# Patient Record
Sex: Female | Born: 1990 | Race: Black or African American | Hispanic: No | Marital: Single | Smoking: Never smoker
Health system: Southern US, Community
[De-identification: ages and names within clinical notes are randomized; demographics above are authoritative.]

## PROBLEM LIST (undated history)

## (undated) ENCOUNTER — Inpatient Hospital Stay (HOSPITAL_COMMUNITY): Payer: Self-pay

## (undated) ENCOUNTER — Inpatient Hospital Stay (HOSPITAL_COMMUNITY): Payer: Medicaid Other

## (undated) DIAGNOSIS — E282 Polycystic ovarian syndrome: Secondary | ICD-10-CM

## (undated) DIAGNOSIS — A609 Anogenital herpesviral infection, unspecified: Secondary | ICD-10-CM

## (undated) DIAGNOSIS — R87629 Unspecified abnormal cytological findings in specimens from vagina: Secondary | ICD-10-CM

## (undated) DIAGNOSIS — R87619 Unspecified abnormal cytological findings in specimens from cervix uteri: Secondary | ICD-10-CM

## (undated) DIAGNOSIS — D649 Anemia, unspecified: Secondary | ICD-10-CM

## (undated) DIAGNOSIS — N390 Urinary tract infection, site not specified: Secondary | ICD-10-CM

## (undated) DIAGNOSIS — N979 Female infertility, unspecified: Secondary | ICD-10-CM

## (undated) DIAGNOSIS — N83209 Unspecified ovarian cyst, unspecified side: Secondary | ICD-10-CM

## (undated) DIAGNOSIS — IMO0002 Reserved for concepts with insufficient information to code with codable children: Secondary | ICD-10-CM

## (undated) DIAGNOSIS — J40 Bronchitis, not specified as acute or chronic: Secondary | ICD-10-CM

## (undated) DIAGNOSIS — O24419 Gestational diabetes mellitus in pregnancy, unspecified control: Secondary | ICD-10-CM

## (undated) DIAGNOSIS — G971 Other reaction to spinal and lumbar puncture: Secondary | ICD-10-CM

## (undated) HISTORY — DX: Unspecified abnormal cytological findings in specimens from vagina: R87.629

## (undated) HISTORY — DX: Bronchitis, not specified as acute or chronic: J40

## (undated) HISTORY — PX: WISDOM TOOTH EXTRACTION: SHX21

## (undated) HISTORY — PX: CHOLECYSTECTOMY: SHX55

## (undated) HISTORY — PX: CERVICAL CERCLAGE: SHX1329

---
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
## 2011-07-20 ENCOUNTER — Encounter: Payer: Self-pay | Admitting: Advanced Practice Midwife

## 2011-08-11 ENCOUNTER — Encounter: Payer: Self-pay | Admitting: Obstetrics and Gynecology

## 2012-03-22 ENCOUNTER — Inpatient Hospital Stay (HOSPITAL_COMMUNITY): Payer: Self-pay

## 2012-03-22 ENCOUNTER — Encounter (HOSPITAL_COMMUNITY): Payer: Self-pay

## 2012-03-22 ENCOUNTER — Inpatient Hospital Stay (HOSPITAL_COMMUNITY)
Admission: AD | Admit: 2012-03-22 | Discharge: 2012-03-22 | Disposition: A | Discharge status: Home / Self Care | Payer: Self-pay | Source: Ambulatory Visit | Attending: Family Medicine | Admitting: Family Medicine

## 2012-03-22 DIAGNOSIS — R1032 Left lower quadrant pain: Secondary | ICD-10-CM | POA: Insufficient documentation

## 2012-03-22 DIAGNOSIS — O209 Hemorrhage in early pregnancy, unspecified: Secondary | ICD-10-CM | POA: Insufficient documentation

## 2012-03-22 HISTORY — DX: Unspecified abnormal cytological findings in specimens from cervix uteri: R87.619

## 2012-03-22 HISTORY — DX: Urinary tract infection, site not specified: N39.0

## 2012-03-22 HISTORY — DX: Anemia, unspecified: D64.9

## 2012-03-22 HISTORY — DX: Reserved for concepts with insufficient information to code with codable children: IMO0002

## 2012-03-22 LAB — CBC
Hemoglobin: 11.1 g/dL — ABNORMAL LOW (ref 12.0–15.0)
MCV: 83 fL (ref 78.0–100.0)
Platelets: 291 10*3/uL (ref 150–400)
RDW: 15.1 % (ref 11.5–15.5)
WBC: 6.4 10*3/uL (ref 4.0–10.5)

## 2012-03-22 LAB — HCG, QUANTITATIVE, PREGNANCY: hCG, Beta Chain, Quant, S: 4985 m[IU]/mL — ABNORMAL HIGH (ref <5)

## 2012-03-22 LAB — POCT PREGNANCY, URINE: Preg Test, Ur: POSITIVE — AB

## 2012-03-22 NOTE — MAU Provider Note (Signed)
  History     CSN: [id number]  Arrival date and time: 03/22/12 1016   None     Chief Complaint  Patient presents with  . Abdominal Pain   HPI  Carla Henderson is a 21 y.o. G2P0 who presents today with LLQ pain. The pain started about a week ago, and she found out she was pregnant  A couple of days ago. She denies any vagina bleeding. She was taking ibuprofen and that helps the pain, but it returns when the medicine wears off.   Past Medical History  Diagnosis Date  . UTI (lower urinary tract infection)   . Abnormal Pap smear   . Anemia     Past Surgical History  Procedure Date  . Wisdom tooth extraction     History reviewed. No pertinent family history.  History  Substance Use Topics  . Smoking status: Never Smoker   . Smokeless tobacco: Not on file  . Alcohol Use: Yes     Comment: occassional     Allergies: Allergies not on file  No prescriptions prior to admission    ROS Physical Exam   Last menstrual period 02/05/2012.  Physical Exam  MAU Course  Procedures  Results for orders placed during the hospital encounter of 03/22/12 (from the past 24 hour(s))  CBC     Status: Abnormal   Collection Time   03/22/12 10:45 AM      Component Value Range   WBC 6.4  4.0 - 10.5 K/uL   RBC 4.05  3.87 - 5.11 MIL/uL   Hemoglobin 11.1 (*) 12.0 - 15.0 g/dL   HCT 33.6 (*) 36.0 - 46.0 %   MCV 83.0  78.0 - 100.0 fL   MCH 27.4  26.0 - 34.0 pg   MCHC 33.0  30.0 - 36.0 g/dL   RDW 15.1  11.5 - 15.5 %   Platelets 291  150 - 400 K/uL  HCG, QUANTITATIVE, PREGNANCY     Status: Abnormal   Collection Time   03/22/12 10:45 AM      Component Value Range   hCG, Beta Chain, Quant, S 4985 (*) <5 mIU/mL  POCT PREGNANCY, URINE     Status: Abnormal   Collection Time   03/22/12 10:46 AM      Component Value Range   Preg Test, Ur POSITIVE (*) NEGATIVE   Ultrasound shows in IUP with about a 1 week lag from LMP date. Recommend FU BHCG in 48 hours and repeat ultrasound in 2 weeks.     Assessment and Plan  1st trimester bleeding.  Repeat ultrasound in 2 weeks.  Mathis Bud 03/22/2012, 11:36 AM

## 2012-03-22 NOTE — MAU Note (Signed)
Pain started a wk ago. "pretty constant when sleeping, comes and goes when awake"left lower quadrant. Had preg confirmation at [facility].

## 2012-03-22 NOTE — MAU Provider Note (Signed)
Chart reviewed and agree with management and plan.

## 2012-03-22 NOTE — MAU Note (Signed)
Patient is in with c/o llq pain for a week. She states [facility] and was told to come to MAU if pain persists. She denies vaginal bleeding or discharge. Her lmp 02/05/12.

## 2012-04-05 ENCOUNTER — Ambulatory Visit (HOSPITAL_COMMUNITY)
Admission: RE | Admit: 2012-04-05 | Discharge: 2012-04-05 | Disposition: A | Discharge status: Home / Self Care | Payer: Medicaid Other | Source: Ambulatory Visit | Attending: Advanced Practice Midwife | Admitting: Advanced Practice Midwife

## 2012-04-05 ENCOUNTER — Encounter (HOSPITAL_COMMUNITY): Payer: Self-pay | Admitting: *Deleted

## 2012-04-05 ENCOUNTER — Inpatient Hospital Stay (HOSPITAL_COMMUNITY)
Admission: AD | Admit: 2012-04-05 | Discharge: 2012-04-05 | Disposition: A | Discharge status: Home / Self Care | Payer: Self-pay | Source: Ambulatory Visit | Attending: Obstetrics and Gynecology | Admitting: Obstetrics and Gynecology

## 2012-04-05 DIAGNOSIS — R109 Unspecified abdominal pain: Secondary | ICD-10-CM

## 2012-04-05 DIAGNOSIS — O99891 Other specified diseases and conditions complicating pregnancy: Secondary | ICD-10-CM | POA: Insufficient documentation

## 2012-04-05 DIAGNOSIS — O26899 Other specified pregnancy related conditions, unspecified trimester: Secondary | ICD-10-CM

## 2012-04-05 DIAGNOSIS — Z3689 Encounter for other specified antenatal screening: Secondary | ICD-10-CM | POA: Insufficient documentation

## 2012-04-05 DIAGNOSIS — O209 Hemorrhage in early pregnancy, unspecified: Secondary | ICD-10-CM

## 2012-04-05 DIAGNOSIS — R1032 Left lower quadrant pain: Secondary | ICD-10-CM | POA: Insufficient documentation

## 2012-04-05 NOTE — MAU Note (Signed)
Rollover from [place]

## 2012-04-05 NOTE — MAU Provider Note (Signed)
  History     CSN: [id number]  Arrival date and time: 04/05/12 1003   None     Chief Complaint  Patient presents with  . Follow-up   HPI Carla Henderson is 21 y.o. G1P0 60w4dweeks presenting for follow up ultrasound.  She was initially seen 2 weeks ago with a week history of LLQ pain in early pregnancy.  Denied bleeding.   LMP 10/14.  BHCG 4988.  Blood type A+.   Continues with cramping  Denies bleeding    Past Medical History  Diagnosis Date  . UTI (lower urinary tract infection)   . Abnormal Pap smear   . Anemia     Past Surgical History  Procedure Date  . Wisdom tooth extraction     No family history on file.  History  Substance Use Topics  . Smoking status: Never Smoker   . Smokeless tobacco: Not on file  . Alcohol Use: Yes     Comment: occassional     Allergies:  Allergies  Allergen Reactions  . Vicodin (Hydrocodone-Acetaminophen) Nausea And Vomiting    Prescriptions prior to admission  Medication Sig Dispense Refill  . ibuprofen (ADVIL,MOTRIN) 200 MG tablet Take 200 mg by mouth every 6 (six) hours as needed. For pain.      .Marland KitchenPhenazopyridine HCl (AZO TABS PO) Take 4-5 tablets by mouth daily.        Review of Systems  Constitutional: Negative.   Gastrointestinal: Positive for abdominal pain (cramping).  Genitourinary:       Neg for vaginal bleeding   Physical Exam   Last menstrual period 02/05/2012.  Physical Exam  Constitutional: She is oriented to person, place, and time. She appears well-developed and well-nourished. No distress.  Respiratory: Effort normal.  Genitourinary:       Not indicated  Neurological: She is alert and oriented to person, place, and time.  Psychiatric: She has a normal mood and affect. Her behavior is normal.       MAU Course  Procedures  MDM Reviewed ultrasound with patient.  Assessment and Plan  A:  Intrauterine pregnancy at 777w3destation     Cramping     Viable pregnancy by Ultrasound  P:  Instructed  patient to call [facility] to begin prenatal care      Tylenol prn for cramping  KEY,EVE M 04/05/2012, 10:08 AM

## 2012-04-09 NOTE — MAU Provider Note (Signed)
Attestation of Attending Supervision of Advanced Practitioner (CNM/NP): Evaluation and management procedures were performed by the Advanced Practitioner under my supervision and collaboration.  I have reviewed the Advanced Practitioner's note and chart, and I agree with the management and plan.  Cassidey Barrales 04/09/2012 6:10 PM

## 2012-05-15 ENCOUNTER — Encounter: Payer: Self-pay | Admitting: Advanced Practice Midwife

## 2013-01-20 ENCOUNTER — Inpatient Hospital Stay (HOSPITAL_COMMUNITY)
Admission: AD | Admit: 2013-01-20 | Discharge: 2013-01-20 | Disposition: A | Discharge status: Home / Self Care | Payer: Medicaid Other | Source: Ambulatory Visit | Attending: Obstetrics & Gynecology | Admitting: Obstetrics & Gynecology

## 2013-01-20 ENCOUNTER — Encounter (HOSPITAL_COMMUNITY): Payer: Self-pay

## 2013-01-20 DIAGNOSIS — N926 Irregular menstruation, unspecified: Secondary | ICD-10-CM | POA: Insufficient documentation

## 2013-01-20 DIAGNOSIS — N949 Unspecified condition associated with female genital organs and menstrual cycle: Secondary | ICD-10-CM | POA: Insufficient documentation

## 2013-01-20 DIAGNOSIS — N938 Other specified abnormal uterine and vaginal bleeding: Secondary | ICD-10-CM | POA: Insufficient documentation

## 2013-01-20 LAB — CBC
HCT: 31.3 % — ABNORMAL LOW (ref 36.0–46.0)
Hemoglobin: 10.2 g/dL — ABNORMAL LOW (ref 12.0–15.0)
MCH: 25.8 pg — ABNORMAL LOW (ref 26.0–34.0)
MCHC: 32.6 g/dL (ref 30.0–36.0)
MCV: 79 fL (ref 78.0–100.0)
RDW: 14.7 % (ref 11.5–15.5)

## 2013-01-20 LAB — URINALYSIS, ROUTINE W REFLEX MICROSCOPIC
Bilirubin Urine: NEGATIVE
Leukocytes, UA: NEGATIVE
Urobilinogen, UA: 0.2 mg/dL (ref 0.0–1.0)

## 2013-01-20 LAB — POCT PREGNANCY, URINE: Preg Test, Ur: NEGATIVE

## 2013-01-20 LAB — WET PREP, GENITAL: WBC, Wet Prep HPF POC: NONE SEEN

## 2013-01-20 LAB — URINE MICROSCOPIC-ADD ON

## 2013-01-20 MED ORDER — FERROUS SULFATE 325 (65 FE) MG PO TABS: 325.0000 mg | ORAL_TABLET | Freq: Every day | ORAL | Status: DC

## 2013-01-20 NOTE — MAU Note (Signed)
Pt has journal of her periods - have been getting progressively longer & heavier, is now having 2nd period in September.

## 2013-01-20 NOTE — MAU Provider Note (Signed)
History     CSN: [id number]  Arrival date and time: 01/20/13 1509   First Provider Initiated Contact with Patient 01/20/13 [phone]      Chief Complaint  Patient presents with  . Vaginal Bleeding   HPI [name] is a 22 y.o. G1P0010 who presents to MAU today with complaint of vaginal bleeding. The patient had SAB in January and states a history of irregular, heavy prolonged periods since then. The has also had associated cramping rated at 5/10 today. She denies use of pain medication, vaginal discharge, UTI symptoms, dizziness, fever, N/V/D or constipation or feeling lightheaded. The patient states that she is currently sexually active without protection and desires pregnancy. She does feel weak and tired. LMP was 12/30/12 - 01/07/13 and patient started bleeding again yesterday. Patient has a journal of her periods since June and they have become progressively more prolonged although seem fairly regular aside from this new episode of bleeding today.   OB History   Grav Para Term Preterm Abortions TAB SAB Ect Mult Living   1               Past Medical History  Diagnosis Date  . UTI (lower urinary tract infection)   . Abnormal Pap smear   . Anemia     Past Surgical History  Procedure Laterality Date  . Wisdom tooth extraction      Family History  Problem Relation Age of Onset  . Other Neg Hx     History  Substance Use Topics  . Smoking status: Never Smoker   . Smokeless tobacco: Not on file  . Alcohol Use: Yes     Comment: occassional     Allergies:  Allergies  Allergen Reactions  . Vicodin [Hydrocodone-Acetaminophen] Nausea And Vomiting    No prescriptions prior to admission    Review of Systems  Constitutional: Positive for malaise/fatigue. Negative for fever.  Gastrointestinal: Positive for abdominal pain. Negative for nausea, vomiting, diarrhea and constipation.  Genitourinary: Negative for dysuria, urgency and frequency.       Neg - vaginal  discharge + vaginal bleeding  Neurological: Positive for weakness. Negative for dizziness and loss of consciousness.   Physical Exam   Blood pressure 123/70, pulse 90, temperature 98.4 F (36.9 C), temperature source Oral, resp. rate 18, height 5' 7"  (1.702 m), weight 209 lb 12.8 oz (95.165 kg), last menstrual period 12/31/2011, SpO2 99.00%, unknown if currently breastfeeding.  Physical Exam  Constitutional: She is oriented to person, place, and time. She appears well-developed and well-nourished. No distress.  HENT:  Head: Normocephalic.  Cardiovascular: Normal rate, regular rhythm and normal heart sounds.   Respiratory: Effort normal and breath sounds normal. No respiratory distress.  GI: Soft. Bowel sounds are normal. She exhibits no distension and no mass. There is tenderness (mild tenderness to palpation of the LLQ). There is no rebound and no guarding.  Genitourinary: Uterus is not enlarged (exam limited by body habitus) and not tender. Cervix exhibits no motion tenderness, no discharge and no friability. Right adnexum displays no mass and no tenderness. Left adnexum displays no mass and no tenderness. There is bleeding (moderate amount of blood in the vaginal vault) around the vagina. No vaginal discharge found.  Neurological: She is alert and oriented to person, place, and time.  Skin: Skin is warm and dry. No erythema.  Psychiatric: She has a normal mood and affect.   Results for orders placed during the hospital encounter of 01/20/13 (from the past 24  hour(s))  URINALYSIS, ROUTINE W REFLEX MICROSCOPIC     Status: Abnormal   Collection Time    01/20/13  4:20 PM      Result Value Range   Color, Urine YELLOW  YELLOW   APPearance CLEAR  CLEAR   Specific Gravity, Urine 1.025  1.005 - 1.030   pH 6.0  5.0 - 8.0   Glucose, UA NEGATIVE  NEGATIVE mg/dL   Hgb urine dipstick LARGE (*) NEGATIVE   Bilirubin Urine NEGATIVE  NEGATIVE   Ketones, ur NEGATIVE  NEGATIVE mg/dL   Protein, ur  NEGATIVE  NEGATIVE mg/dL   Urobilinogen, UA 0.2  0.0 - 1.0 mg/dL   Nitrite NEGATIVE  NEGATIVE   Leukocytes, UA NEGATIVE  NEGATIVE  WET PREP, GENITAL     Status: Abnormal   Collection Time    01/20/13  4:20 PM      Result Value Range   Yeast Wet Prep HPF POC NONE SEEN  NONE SEEN   Trich, Wet Prep NONE SEEN  NONE SEEN   Clue Cells Wet Prep HPF POC FEW (*) NONE SEEN   WBC, Wet Prep HPF POC NONE SEEN  NONE SEEN  URINE MICROSCOPIC-ADD ON     Status: Abnormal   Collection Time    01/20/13  4:20 PM      Result Value Range   Squamous Epithelial / LPF RARE  RARE   WBC, UA 0-2  <3 WBC/hpf   RBC / HPF 21-50  <3 RBC/hpf   Bacteria, UA FEW (*) RARE   Urine-Other MUCOUS PRESENT    POCT PREGNANCY, URINE     Status: None   Collection Time    01/20/13  4:28 PM      Result Value Range   Preg Test, Ur NEGATIVE  NEGATIVE  CBC     Status: Abnormal   Collection Time    01/20/13  4:46 PM      Result Value Range   WBC 6.3  4.0 - 10.5 K/uL   RBC 3.96  3.87 - 5.11 MIL/uL   Hemoglobin 10.2 (*) 12.0 - 15.0 g/dL   HCT 31.3 (*) 36.0 - 46.0 %   MCV 79.0  78.0 - 100.0 fL   MCH 25.8 (*) 26.0 - 34.0 pg   MCHC 32.6  30.0 - 36.0 g/dL   RDW 14.7  11.5 - 15.5 %   Platelets 308  150 - 400 K/uL    MAU Course  Procedures None  MDM UPT UA, Wet prep, GC/Chlamydia, CBC today Patient is hemodynamically stable Discussed OCPs as an option for short-term regulation of cycles. Patient will consider prior to [facility] clinic appointment Assessment and Plan  A: Irregular menses  P: Discharge home Rx for Ferrous Sulfate sent to patient's pharmacy Patient referred to [facility] clinic for follow-up and further management Patient may return to MAU as needed or if her condition were to change or worsen  Farris Has, PA-C  01/20/2013, 5:31 PM

## 2013-01-20 NOTE — MAU Provider Note (Signed)
Attestation of Attending Supervision of Advanced Practitioner (CNM/NP): Evaluation and management procedures were performed by the Advanced Practitioner under my supervision and collaboration.  I have reviewed the Advanced Practitioner's note and chart, and I agree with the management and plan.  HARRAWAY-SMITH, Jaculin Rasmus 6:44 PM

## 2013-01-20 NOTE — MAU Note (Signed)
Patient states she had a SAB in January. Has been having irregular periods since that time being heavier and longer. Last period 9-8/16 and started again yesterday. Heavy bleeding with cramping.

## 2013-01-21 LAB — GC/CHLAMYDIA PROBE AMP: GC Probe RNA: NEGATIVE

## 2013-02-17 ENCOUNTER — Encounter: Payer: Medicaid Other | Admitting: Obstetrics & Gynecology

## 2013-08-05 ENCOUNTER — Inpatient Hospital Stay (HOSPITAL_COMMUNITY)
Admission: AD | Admit: 2013-08-05 | Discharge: 2013-08-05 | Disposition: A | Discharge status: Home / Self Care | Payer: Medicaid Other | Source: Ambulatory Visit | Attending: Obstetrics & Gynecology | Admitting: Obstetrics & Gynecology

## 2013-08-05 ENCOUNTER — Encounter (HOSPITAL_COMMUNITY): Payer: Self-pay | Admitting: *Deleted

## 2013-08-05 DIAGNOSIS — N926 Irregular menstruation, unspecified: Secondary | ICD-10-CM | POA: Insufficient documentation

## 2013-08-05 DIAGNOSIS — N925 Other specified irregular menstruation: Secondary | ICD-10-CM | POA: Insufficient documentation

## 2013-08-05 DIAGNOSIS — N949 Unspecified condition associated with female genital organs and menstrual cycle: Secondary | ICD-10-CM | POA: Insufficient documentation

## 2013-08-05 DIAGNOSIS — A599 Trichomoniasis, unspecified: Secondary | ICD-10-CM

## 2013-08-05 DIAGNOSIS — D649 Anemia, unspecified: Secondary | ICD-10-CM | POA: Insufficient documentation

## 2013-08-05 DIAGNOSIS — N938 Other specified abnormal uterine and vaginal bleeding: Secondary | ICD-10-CM | POA: Insufficient documentation

## 2013-08-05 DIAGNOSIS — A5901 Trichomonal vulvovaginitis: Secondary | ICD-10-CM | POA: Insufficient documentation

## 2013-08-05 LAB — CBC
HCT: 34.2 % — ABNORMAL LOW (ref 36.0–46.0)
HEMOGLOBIN: 11.1 g/dL — AB (ref 12.0–15.0)
MCH: 27.3 pg (ref 26.0–34.0)
MCHC: 32.5 g/dL (ref 30.0–36.0)
MCV: 84.2 fL (ref 78.0–100.0)
PLATELETS: 264 10*3/uL (ref 150–400)
RBC: 4.06 MIL/uL (ref 3.87–5.11)
RDW: 15.8 % — ABNORMAL HIGH (ref 11.5–15.5)
WBC: 6.4 10*3/uL (ref 4.0–10.5)

## 2013-08-05 LAB — WET PREP, GENITAL: YEAST WET PREP: NONE SEEN

## 2013-08-05 LAB — POCT PREGNANCY, URINE: PREG TEST UR: NEGATIVE

## 2013-08-05 MED ORDER — METRONIDAZOLE 500 MG PO TABS
2000.0000 mg | ORAL_TABLET | Freq: Once | ORAL | Status: AC
  Administered 2013-08-05: 2000 mg via ORAL
  Filled 2013-08-05: qty 4

## 2013-08-05 NOTE — MAU Note (Signed)
Patient states she has had a history of irregular periods. Was seen in September, but on BCP's for a while then stopped. Periods are irregular again. States she is trying to get pregnant. Thinks she might have PCOS. Denies pain. Is currently on period for 9 days. States bleeding is heavy and changed pads x 3 today.

## 2013-08-05 NOTE — MAU Provider Note (Signed)
History     CSN: [id number]  Arrival date and time: 08/05/13 1800   First Provider Initiated Contact with Patient 08/05/13 2056      No chief complaint on file.  HPI [name] is a 23 y.o. G2P0010 who presents to MAU today with complaint of vaginal bleeding x 8 days. The patient states that she has a history of irregular periods for which she was started on OCPs. She states that she recently stopped her OCPs earlier this year because she desires pregnancy. She states that she has had irregular periods again since stopping OCPs. The current episode started on 07/27/13 and continues today. She states that the bleeding is heavy, but only requiring her to change her pad 3x today. She denies abdominal pain, fever, dizziness or N/V/D or constipation.   OB History   Grav Para Term Preterm Abortions TAB SAB Ect Mult Living   2    1  1          Past Medical History  Diagnosis Date  . UTI (lower urinary tract infection)   . Abnormal Pap smear   . Anemia     Past Surgical History  Procedure Laterality Date  . Wisdom tooth extraction      Family History  Problem Relation Age of Onset  . Other Neg Hx     History  Substance Use Topics  . Smoking status: Never Smoker   . Smokeless tobacco: Not on file  . Alcohol Use: No     Comment: occassional     Allergies:  Allergies  Allergen Reactions  . Vicodin [Hydrocodone-Acetaminophen] Nausea And Vomiting    Prescriptions prior to admission  Medication Sig Dispense Refill  . Prenatal Vit-Fe Fumarate-FA (PRENATAL MULTIVITAMIN) TABS tablet Take 1 tablet by mouth daily at 12 noon.        Review of Systems  Constitutional: Negative for fever and malaise/fatigue.  Gastrointestinal: Negative for nausea, vomiting, abdominal pain, diarrhea and constipation.  Genitourinary: Negative for dysuria, urgency and frequency.       + vaginal bleeding  Neurological: Negative for dizziness.   Physical Exam   Blood pressure 124/83, pulse  101, temperature 98.6 F (37 C), temperature source Oral, resp. rate 18, height 5' 6"  (1.676 m), weight 94.983 kg (209 lb 6.4 oz), last menstrual period 07/27/2013, SpO2 99.00%, unknown if currently breastfeeding.  Physical Exam  Constitutional: She is oriented to person, place, and time. She appears well-developed and well-nourished.  HENT:  Head: Normocephalic and atraumatic.  Cardiovascular: Normal rate.   Respiratory: Effort normal.  GI: Soft. She exhibits no distension and no mass. There is no tenderness. There is no rebound and no guarding.  Genitourinary: Uterus is not enlarged and not tender. Cervix exhibits no motion tenderness, no discharge and no friability. Right adnexum displays no mass and no tenderness. Left adnexum displays no mass and no tenderness. There is bleeding (small amount of blood in the vaginal vault) around the vagina. No vaginal discharge found.  Neurological: She is alert and oriented to person, place, and time.  Skin: Skin is warm and dry. No erythema.  Psychiatric: She has a normal mood and affect.   Results for orders placed during the hospital encounter of 08/05/13 (from the past 24 hour(s))  POCT PREGNANCY, URINE     Status: None   Collection Time    08/05/13  6:31 PM      Result Value Ref Range   Preg Test, Ur NEGATIVE  NEGATIVE  CBC  Status: Abnormal   Collection Time    08/05/13  8:10 PM      Result Value Ref Range   WBC 6.4  4.0 - 10.5 K/uL   RBC 4.06  3.87 - 5.11 MIL/uL   Hemoglobin 11.1 (*) 12.0 - 15.0 g/dL   HCT 34.2 (*) 36.0 - 46.0 %   MCV 84.2  78.0 - 100.0 fL   MCH 27.3  26.0 - 34.0 pg   MCHC 32.5  30.0 - 36.0 g/dL   RDW 15.8 (*) 11.5 - 15.5 %   Platelets 264  150 - 400 K/uL  WET PREP, GENITAL     Status: Abnormal   Collection Time    08/05/13  9:08 PM      Result Value Ref Range   Yeast Wet Prep HPF POC NONE SEEN  NONE SEEN   Trich, Wet Prep FEW (*) NONE SEEN   Clue Cells Wet Prep HPF POC FEW (*) NONE SEEN   WBC, Wet Prep HPF  POC FEW (*) NONE SEEN    MAU Course  Procedures None  MDM UPT - negative CBC, wet prep and GC/Chlamydia today 2G Flagyl given in MAU Assessment and Plan  A: Irregular periods possibly secondary to PCOS Trichomonas  P: Discharge home Patient treated with Flagyl in MAU today Partner treatment advised for trichomonas Patient given follow-up appointment in [place] on Friday, April 17th at 8:00 am Patient may return to MAU as needed or if her condition were to change or worsen  Farris Has, PA-C  08/05/2013, 8:56 PM

## 2013-08-05 NOTE — Discharge Instructions (Signed)
Trichomoniasis Trichomoniasis is an infection, caused by the Trichomonas organism, that affects both women and men. In women, the outer female genitalia and the vagina are affected. In men, the penis is mainly affected, but the prostate and other reproductive organs can also be involved. Trichomoniasis is a sexually transmitted disease (STD) and is most often passed to another person through sexual contact. The majority of people who get trichomoniasis do so from a sexual encounter and are also at risk for other STDs. CAUSES   Sexual intercourse with an infected partner.  It can be present in swimming pools or hot tubs. SYMPTOMS   Abnormal gray-green frothy vaginal discharge in women.  Vaginal itching and irritation in women.  Itching and irritation of the area outside the vagina in women.  Penile discharge with or without pain in males.  Inflammation of the urethra (urethritis), causing painful urination.  Bleeding after sexual intercourse. RELATED COMPLICATIONS  Pelvic inflammatory disease.  Infection of the uterus (endometritis).  Infertility.  Tubal (ectopic) pregnancy.  It can be associated with other STDs, including gonorrhea and chlamydia, hepatitis B, and HIV. COMPLICATIONS DURING PREGNANCY  Early (premature) delivery.  Premature rupture of the membranes (PROM).  Low birth weight. DIAGNOSIS   Visualization of Trichomonas under the microscope from the vagina discharge.  Ph of the vagina greater than 4.5, tested with a test tape.  Trich Rapid Test.  Culture of the organism, but this is not usually needed.  It may be found on a Pap test.  Having a "strawberry cervix,"which means the cervix looks very red like a strawberry. TREATMENT   You may be given medication to fight the infection. Inform your caregiver if you could be or are pregnant. Some medications used to treat the infection should not be taken during pregnancy.  Over-the-counter medications or  creams to decrease itching or irritation may be recommended.  Your sexual partner will need to be treated if infected. HOME CARE INSTRUCTIONS   Take all medication prescribed by your caregiver.  Take over-the-counter medication for itching or irritation as directed by your caregiver.  Do not have sexual intercourse while you have the infection.  Do not douche or wear tampons.  Discuss your infection with your partner, as your partner may have acquired the infection from you. Or, your partner may have been the person who transmitted the infection to you.  Have your sex partner examined and treated if necessary.  Practice safe, informed, and protected sex.  See your caregiver for other STD testing. SEEK MEDICAL CARE IF:   You still have symptoms after you finish the medication.  You have an oral temperature above 102 F (38.9 C).  You develop belly (abdominal) pain.  You have pain when you urinate.  You have bleeding after sexual intercourse.  You develop a rash.  The medication makes you sick or makes you throw up (vomit). Document Released: 10/04/2000 Document Revised: 07/03/2011 Document Reviewed: 10/30/2008 [facility] Patient Information 2014 [place], [place]. Polycystic Ovarian Syndrome Polycystic ovarian syndrome (PCOS) is a common hormonal disorder among women of reproductive age. Most women with PCOS grow many small cysts on their ovaries. PCOS can cause problems with your periods and make it difficult to get pregnant. It can also cause an increased risk of miscarriage with pregnancy. If left untreated, PCOS can lead to serious health problems, such as diabetes and heart disease. CAUSES The cause of PCOS is not fully understood, but genetics may be a factor. SIGNS AND SYMPTOMS   Infrequent or  no menstrual periods.   Inability to get pregnant (infertility) because of not ovulating.   Increased growth of hair on the face, chest, stomach, back, thumbs, thighs, or  toes.   Acne, oily skin, or dandruff.   Pelvic pain.   Weight gain or obesity, usually carrying extra weight around the waist.   Type 2 diabetes.   High cholesterol.   High blood pressure.   Female-pattern baldness or thinning hair.   Patches of thickened and dark brown or black skin on the neck, arms, breasts, or thighs.   Tiny excess flaps of skin (skin tags) in the armpits or neck area.   Excessive snoring and having breathing stop at times while asleep (sleep apnea).   Deepening of the voice.   Gestational diabetes when pregnant.  DIAGNOSIS  There is no single test to diagnose PCOS.   Your health care provider will:   Take a medical history.   Perform a pelvic exam.   Have ultrasonography done.   Check your female and female hormone levels.   Measure glucose or sugar levels in the blood.   Do other blood tests.   If you are producing too many female hormones, your health care provider will make sure it is from PCOS. At the physical exam, your health care provider will want to evaluate the areas of increased hair growth. Try to allow natural hair growth for a few days before the visit.   During a pelvic exam, the ovaries may be enlarged or swollen because of the increased number of small cysts. This can be seen more easily by using vaginal ultrasonography or screening to examine the ovaries and lining of the uterus (endometrium) for cysts. The uterine lining may become thicker if you have not been having a regular period.  TREATMENT  Because there is no cure for PCOS, it needs to be managed to prevent problems. Treatments are based on your symptoms. Treatment is also based on whether you want to have a baby or whether you need contraception.  Treatment may include:   Progesterone hormone to start a menstrual period.   Birth control pills to make you have regular menstrual periods.   Medicines to make you ovulate, if you want to get  pregnant.   Medicines to control your insulin.   Medicine to control your blood pressure.   Medicine and diet to control your high cholesterol and triglycerides in your blood.  Medicine to reduce excessive hair growth.  Surgery, making small holes in the ovary, to decrease the amount of female hormone production. This is done through a long, lighted tube (laparoscope) placed into the pelvis through a tiny incision in the lower abdomen.  HOME CARE INSTRUCTIONS  Only take over-the-counter or prescription medicine as directed by your health care provider.  Pay attention to the foods you eat and your activity levels. This can help reduce the effects of PCOS.  Keep your weight under control.  Eat foods that are low in carbohydrate and high in fiber.  Exercise regularly. SEEK MEDICAL CARE IF:  Your symptoms do not get better with medicine.  You have new symptoms. Document Released: 08/04/2004 Document Revised: 01/29/2013 Document Reviewed: 09/26/2012 [facility] Patient Information 2014 [place], [place].

## 2013-08-06 ENCOUNTER — Encounter (HOSPITAL_COMMUNITY): Payer: Self-pay | Admitting: Medical

## 2013-08-06 LAB — GC/CHLAMYDIA PROBE AMP
CT PROBE, AMP APTIMA: NEGATIVE
GC Probe RNA: NEGATIVE

## 2013-08-07 NOTE — MAU Provider Note (Signed)
Attestation of Attending Supervision of Advanced Practitioner (CNM/NP): Evaluation and management procedures were performed by the Advanced Practitioner under my supervision and collaboration. I have reviewed the Advanced Practitioner's note and chart, and I agree with the management and plan.  Fredderick Phenix Amen Dargis 6:08 AM

## 2013-08-08 ENCOUNTER — Encounter: Payer: Self-pay | Admitting: Obstetrics & Gynecology

## 2013-08-08 ENCOUNTER — Ambulatory Visit (INDEPENDENT_AMBULATORY_CARE_PROVIDER_SITE_OTHER): Payer: Medicaid Other | Admitting: Obstetrics & Gynecology

## 2013-08-08 VITALS — BP 119/82 | HR 78 | Ht 69.0 in | Wt 208.9 lb

## 2013-08-08 DIAGNOSIS — N926 Irregular menstruation, unspecified: Secondary | ICD-10-CM

## 2013-08-08 MED ORDER — METFORMIN HCL 500 MG PO TABS: 500.0000 mg | ORAL_TABLET | Freq: Two times a day (BID) | ORAL | Status: DC

## 2013-08-08 NOTE — Patient Instructions (Signed)
Polycystic Ovarian Syndrome Polycystic ovarian syndrome (PCOS) is a common hormonal disorder among women of reproductive age. Most women with PCOS grow many small cysts on their ovaries. PCOS can cause problems with your periods and make it difficult to get pregnant. It can also cause an increased risk of miscarriage with pregnancy. If left untreated, PCOS can lead to serious health problems, such as diabetes and heart disease. CAUSES The cause of PCOS is not fully understood, but genetics may be a factor. SIGNS AND SYMPTOMS   Infrequent or no menstrual periods.   Inability to get pregnant (infertility) because of not ovulating.   Increased growth of hair on the face, chest, stomach, back, thumbs, thighs, or toes.   Acne, oily skin, or dandruff.   Pelvic pain.   Weight gain or obesity, usually carrying extra weight around the waist.   Type 2 diabetes.   High cholesterol.   High blood pressure.   Female-pattern baldness or thinning hair.   Patches of thickened and dark brown or black skin on the neck, arms, breasts, or thighs.   Tiny excess flaps of skin (skin tags) in the armpits or neck area.   Excessive snoring and having breathing stop at times while asleep (sleep apnea).   Deepening of the voice.   Gestational diabetes when pregnant.  DIAGNOSIS  There is no single test to diagnose PCOS.   Your health care provider will:   Take a medical history.   Perform a pelvic exam.   Have ultrasonography done.   Check your female and female hormone levels.   Measure glucose or sugar levels in the blood.   Do other blood tests.   If you are producing too many female hormones, your health care provider will make sure it is from PCOS. At the physical exam, your health care provider will want to evaluate the areas of increased hair growth. Try to allow natural hair growth for a few days before the visit.   During a pelvic exam, the ovaries may be enlarged  or swollen because of the increased number of small cysts. This can be seen more easily by using vaginal ultrasonography or screening to examine the ovaries and lining of the uterus (endometrium) for cysts. The uterine lining may become thicker if you have not been having a regular period.  TREATMENT  Because there is no cure for PCOS, it needs to be managed to prevent problems. Treatments are based on your symptoms. Treatment is also based on whether you want to have a baby or whether you need contraception.  Treatment may include:   Progesterone hormone to start a menstrual period.   Birth control pills to make you have regular menstrual periods.   Medicines to make you ovulate, if you want to get pregnant.   Medicines to control your insulin.   Medicine to control your blood pressure.   Medicine and diet to control your high cholesterol and triglycerides in your blood.  Medicine to reduce excessive hair growth.  Surgery, making small holes in the ovary, to decrease the amount of female hormone production. This is done through a long, lighted tube (laparoscope) placed into the pelvis through a tiny incision in the lower abdomen.  HOME CARE INSTRUCTIONS  Only take over-the-counter or prescription medicine as directed by your health care provider.  Pay attention to the foods you eat and your activity levels. This can help reduce the effects of PCOS.  Keep your weight under control.  Eat foods that are  low in carbohydrate and high in fiber.  Exercise regularly. SEEK MEDICAL CARE IF:  Your symptoms do not get better with medicine.  You have new symptoms. Document Released: 08/04/2004 Document Revised: 01/29/2013 Document Reviewed: 09/26/2012 [facility] Patient Information 2014 [place], [place].

## 2013-08-08 NOTE — Progress Notes (Signed)
Patient ID: Carla Henderson, female   DOB: February 25, 1991, 23 y.o.   MRN: [id number]  Chief Complaint  Patient presents with  . Menorrhagia    HPI Carla Henderson is a 23 y.o. female.  [id number] Patient's last menstrual period was 07/27/2013. Suspects she has PCOS and wants to try metformin. Trying to conceive. Stopped OCP 2 mo ago and  Had heavy period recently, went to [facility] HPI  Past Medical History  Diagnosis Date  . UTI (lower urinary tract infection)   . Abnormal Pap smear   . Anemia     Past Surgical History  Procedure Laterality Date  . Wisdom tooth extraction      Family History  Problem Relation Age of Onset  . Other Neg Hx   . Hypertension Paternal Grandmother   . Diabetes Paternal Grandfather     Social History History  Substance Use Topics  . Smoking status: Never Smoker   . Smokeless tobacco: Never Used  . Alcohol Use: No     Comment: occassional     Allergies  Allergen Reactions  . Vicodin [Hydrocodone-Acetaminophen] Nausea And Vomiting    Current Outpatient Prescriptions  Medication Sig Dispense Refill  . metFORMIN (GLUCOPHAGE) 500 MG tablet Take 1 tablet (500 mg total) by mouth 2 (two) times daily with a meal.  60 tablet  6  . Prenatal Vit-Fe Fumarate-FA (PRENATAL MULTIVITAMIN) TABS tablet Take 1 tablet by mouth daily at 12 noon.       No current facility-administered medications for this visit.    Review of Systems Review of Systems  Constitutional: Negative.   Genitourinary: Positive for vaginal bleeding and menstrual problem. Negative for vaginal discharge and pelvic pain.    Blood pressure 119/82, pulse 78, height 5' 9"  (1.753 m), weight 208 lb 14.4 oz (94.756 kg), last menstrual period 07/27/2013, not currently breastfeeding.  Physical Exam Physical Exam  Constitutional: She is oriented to person, place, and time. She appears well-developed. No distress.  Neurological: She is alert and oriented to person, place, and time.  Skin: Skin is warm  and dry.  Psychiatric: She has a normal mood and affect. Her behavior is normal.    Data Reviewed [facility] note  Assessment    H/O irregular menses, decline OCP for cycle control     Plan    Metformin 500  Mg BID, RTC 4-6 mo       Woodroe Mode 08/08/2013, 8:49 AM

## 2013-08-08 NOTE — Progress Notes (Signed)
Pt seen in [facility] for prolonged periods last about 9 days. Not regular. Also told in [facility] that she may have PCOS.

## 2014-02-23 ENCOUNTER — Encounter: Payer: Self-pay | Admitting: Obstetrics & Gynecology

## 2014-03-23 ENCOUNTER — Encounter (HOSPITAL_COMMUNITY): Payer: Self-pay | Admitting: *Deleted

## 2014-03-23 ENCOUNTER — Emergency Department (HOSPITAL_COMMUNITY): Payer: Medicaid Other

## 2014-03-23 ENCOUNTER — Emergency Department (HOSPITAL_COMMUNITY)
Admission: EM | Admit: 2014-03-23 | Discharge: 2014-03-23 | Disposition: A | Discharge status: Home / Self Care | Payer: Medicaid Other | Attending: Emergency Medicine | Admitting: Emergency Medicine

## 2014-03-23 DIAGNOSIS — W109XXA Fall (on) (from) unspecified stairs and steps, initial encounter: Secondary | ICD-10-CM | POA: Insufficient documentation

## 2014-03-23 DIAGNOSIS — Z79899 Other long term (current) drug therapy: Secondary | ICD-10-CM | POA: Insufficient documentation

## 2014-03-23 DIAGNOSIS — Y9389 Activity, other specified: Secondary | ICD-10-CM | POA: Insufficient documentation

## 2014-03-23 DIAGNOSIS — W19XXXA Unspecified fall, initial encounter: Secondary | ICD-10-CM

## 2014-03-23 DIAGNOSIS — Y998 Other external cause status: Secondary | ICD-10-CM | POA: Insufficient documentation

## 2014-03-23 DIAGNOSIS — Z87440 Personal history of urinary (tract) infections: Secondary | ICD-10-CM | POA: Insufficient documentation

## 2014-03-23 DIAGNOSIS — S161XXA Strain of muscle, fascia and tendon at neck level, initial encounter: Secondary | ICD-10-CM | POA: Insufficient documentation

## 2014-03-23 DIAGNOSIS — Y9289 Other specified places as the place of occurrence of the external cause: Secondary | ICD-10-CM | POA: Insufficient documentation

## 2014-03-23 DIAGNOSIS — Z862 Personal history of diseases of the blood and blood-forming organs and certain disorders involving the immune mechanism: Secondary | ICD-10-CM | POA: Insufficient documentation

## 2014-03-23 MED ORDER — TRAMADOL HCL 50 MG PO TABS: 50.0000 mg | ORAL_TABLET | Freq: Four times a day (QID) | ORAL | Status: DC | PRN

## 2014-03-23 MED ORDER — CYCLOBENZAPRINE HCL 10 MG PO TABS: 10.0000 mg | ORAL_TABLET | Freq: Three times a day (TID) | ORAL | Status: DC | PRN

## 2014-03-23 NOTE — ED Provider Notes (Signed)
CSN: [id number]     Arrival date & time 03/23/14  1601 History  This chart was scribed for Kem Parkinson, PA-C with Carmin Muskrat, MD by Edison Simon, ED Scribe. This patient was seen in room APFT21/APFT21 and the patient's care was started at 5:03 PM.    Chief Complaint  Patient presents with  . Fall   The history is provided by the patient. No language interpreter was used.    HPI Comments: Carla Henderson is a 23 y.o. female who presents to the Emergency Department complaining of pain to her neck with onset 2 days ago status post falling down 7 steps. She states she was wearing heels and lost her footing then fall backwards and slid down the stairs. She states she has had constant pain since then and has been going to work. She reports associated dizziness with movement of her head yesterday.  She notes nausea but denies vomiting. She also notes bruising and soreness to her upper back and sides. She denies numbness, tingling, weakness, syncope, head injury, visual changes, pain in her legs, or pregnancy.  Past Medical History  Diagnosis Date  . UTI (lower urinary tract infection)   . Abnormal Pap smear   . Anemia    Past Surgical History  Procedure Laterality Date  . Wisdom tooth extraction     Family History  Problem Relation Age of Onset  . Other Neg Hx   . Hypertension Paternal Grandmother   . Diabetes Paternal Grandfather    History  Substance Use Topics  . Smoking status: Never Smoker   . Smokeless tobacco: Never Used  . Alcohol Use: No     Comment: occassional    OB History    Gravida Para Term Preterm AB TAB SAB Ectopic Multiple Living   1    1  1         Review of Systems  Constitutional: Negative for fever and chills.  Cardiovascular: Negative for chest pain.  Gastrointestinal: Positive for nausea. Negative for vomiting.  Genitourinary: Negative for dysuria and difficulty urinating.  Musculoskeletal: Positive for neck pain. Negative for back pain and joint  swelling.  Skin: Positive for color change (ecchymosis). Negative for wound.  Neurological: Negative for dizziness, syncope, weakness, numbness and headaches.  All other systems reviewed and are negative.     Allergies  Vicodin  Home Medications   Prior to Admission medications   Medication Sig Start Date End Date Taking? Authorizing Provider  metFORMIN (GLUCOPHAGE) 500 MG tablet Take 1 tablet (500 mg total) by mouth 2 (two) times daily with a meal. 08/08/13   Woodroe Mode, MD  Prenatal Vit-Fe Fumarate-FA (PRENATAL MULTIVITAMIN) TABS tablet Take 1 tablet by mouth daily at 12 noon.    Historical Provider, MD   BP 126/75 mmHg  Pulse 79  Temp(Src) 98.3 F (36.8 C) (Oral)  Resp 18  Ht 5' 9"  (1.753 m)  Wt 205 lb (92.987 kg)  BMI 30.26 kg/m2  SpO2 100%  LMP 03/11/2014 Physical Exam  Constitutional: She is oriented to person, place, and time. She appears well-developed and well-nourished.  HENT:  Head: Normocephalic and atraumatic.  Eyes: Conjunctivae and EOM are normal. Pupils are equal, round, and reactive to light.  Neck: Normal range of motion. Neck supple.  Cardiovascular: Normal rate, regular rhythm and normal heart sounds.   No murmur heard. Pulmonary/Chest: Effort normal and breath sounds normal. No respiratory distress. She has no wheezes. She has no rales.  Musculoskeletal: Normal range of motion. She  exhibits no edema.  Midline tenderness of the C-spine No bony deformities or stepoffs  Neurological: She is alert and oriented to person, place, and time.  5/5 strength against resistance bilateral upper extremities  Grips and pulses intact   Skin: Skin is warm and dry.  No bruising noted to neck or upper back  Psychiatric: She has a normal mood and affect.  Nursing note and vitals reviewed.   ED Course  Procedures (including critical care time)  DIAGNOSTIC STUDIES: Oxygen Saturation is 100% on room air, normal by my interpretation.    COORDINATION OF  CARE: 5:09 PM Discussed treatment plan with patient at beside, the patient agrees with the plan and has no further questions at this time.   Labs Review Labs Reviewed - No data to display  Imaging Review Dg Cervical Spine Complete  03/23/2014   CLINICAL DATA:  Post fall backwards aunts there is on Saturday hitting back of head and neck on Saturday now with posterior neck pain. Initial encounter.  EXAM: CERVICAL SPINE  4+ VIEWS  COMPARISON:  None.  FINDINGS: C1 to the superior endplate of T1 is imaged on the provided lateral radiograph.  Normal alignment of the cervical spine. No anterolisthesis or retrolisthesis. The bilateral facets are normally aligned. The dens is normally positioned between the lateral masses of C1.  Cervical vertebral body heights are preserved. Prevertebral soft tissues are normal.  Intervertebral disc space heights are preserved. The bilateral neural foramina appear widely patent.  Regional soft tissues are normal.  Limited visualization of lung apices is normal.  IMPRESSION: Normal radiographs of the cervical spine.   Electronically Signed   By: Sandi Mariscal M.D.   On: 03/23/2014 17:42     EKG Interpretation None      MDM   Final diagnoses:  Fall  Cervical strain, acute, initial encounter   Pt is well appearing, no concerning sx's for emergent neurological process. Moves all extremities well.   No dizzines at present, orthostatics wnml.  Likely cervical strain.  Pt agrees to ice , rx for ultram and flexeril, PMD f/u if needed  I personally performed the services described in this documentation, which was scribed in my presence. The recorded information has been reviewed and is accurate.    Kavaughn Faucett L. [place], PA-C 03/25/14 1323  Carmin Muskrat, MD 03/25/14 [phone]

## 2014-03-23 NOTE — ED Notes (Signed)
Pt states she fell down a few steps on Saturday and has had neck pain since. States it hurts to turn neck. Pt ambulating without difficulty.

## 2014-03-23 NOTE — ED Notes (Signed)
Neck pain since fall down 3-4 steps on Saturday.  Alert, NAD

## 2014-03-23 NOTE — Discharge Instructions (Signed)
Cervical Sprain A cervical sprain is when the tissues (ligaments) that hold the neck bones in place stretch or tear. HOME CARE   Put ice on the injured area.  Put ice in a plastic bag.  Place a towel between your skin and the bag.  Leave the ice on for 15-20 minutes, 3-4 times a day.  You may have been given a collar to wear. This collar keeps your neck from moving while you heal.  Do not take the collar off unless told by your doctor.  If you have long hair, keep it outside of the collar.  Ask your doctor before changing the position of your collar. You may need to change its position over time to make it more comfortable.  If you are allowed to take off the collar for cleaning or bathing, follow your doctor's instructions on how to do it safely.  Keep your collar clean by wiping it with mild soap and water. Dry it completely. If the collar has removable pads, remove them every 1-2 days to hand wash them with soap and water. Allow them to air dry. They should be dry before you wear them in the collar.  Do not drive while wearing the collar.  Only take medicine as told by your doctor.  Keep all doctor visits as told.  Keep all physical therapy visits as told.  Adjust your work station so that you have good posture while you work.  Avoid positions and activities that make your problems worse.  Warm up and stretch before being active. GET HELP IF:  Your pain is not controlled with medicine.  You cannot take less pain medicine over time as planned.  Your activity level does not improve as expected. GET HELP RIGHT AWAY IF:   You are bleeding.  Your stomach is upset.  You have an allergic reaction to your medicine.  You develop new problems that you cannot explain.  You lose feeling (become numb) or you cannot move any part of your body (paralysis).  You have tingling or weakness in any part of your body.  Your symptoms get worse. Symptoms include:  Pain,  soreness, stiffness, puffiness (swelling), or a burning feeling in your neck.  Pain when your neck is touched.  Shoulder or upper back pain.  Limited ability to move your neck.  Headache.  Dizziness.  Your hands or arms feel week, lose feeling, or tingle.  Muscle spasms.  Difficulty swallowing or chewing. MAKE SURE YOU:   Understand these instructions.  Will watch your condition.  Will get help right away if you are not doing well or get worse. Document Released: 09/27/2007 Document Revised: 12/11/2012 Document Reviewed: 10/16/2012 [facility] Patient Information 2015 [place], [place]. This information is not intended to replace advice given to you by your health care provider. Make sure you discuss any questions you have with your health care provider.

## 2014-10-22 ENCOUNTER — Inpatient Hospital Stay (HOSPITAL_COMMUNITY)
Admission: AD | Admit: 2014-10-22 | Discharge: 2014-10-22 | Disposition: A | Discharge status: Home / Self Care | Payer: Self-pay | Source: Ambulatory Visit | Attending: Obstetrics & Gynecology | Admitting: Obstetrics & Gynecology

## 2014-10-22 ENCOUNTER — Inpatient Hospital Stay (HOSPITAL_COMMUNITY): Payer: Medicaid Other

## 2014-10-22 ENCOUNTER — Encounter (HOSPITAL_COMMUNITY): Payer: Self-pay | Admitting: *Deleted

## 2014-10-22 ENCOUNTER — Other Ambulatory Visit: Payer: Self-pay | Admitting: Obstetrics & Gynecology

## 2014-10-22 DIAGNOSIS — Q505 Embryonic cyst of broad ligament: Secondary | ICD-10-CM | POA: Insufficient documentation

## 2014-10-22 DIAGNOSIS — R1032 Left lower quadrant pain: Secondary | ICD-10-CM | POA: Insufficient documentation

## 2014-10-22 DIAGNOSIS — N8320 Unspecified ovarian cysts: Secondary | ICD-10-CM

## 2014-10-22 DIAGNOSIS — N83299 Other ovarian cyst, unspecified side: Secondary | ICD-10-CM

## 2014-10-22 DIAGNOSIS — R102 Pelvic and perineal pain: Secondary | ICD-10-CM

## 2014-10-22 HISTORY — DX: Polycystic ovarian syndrome: E28.2

## 2014-10-22 LAB — URINALYSIS, ROUTINE W REFLEX MICROSCOPIC
Bilirubin Urine: NEGATIVE
GLUCOSE, UA: NEGATIVE mg/dL
Ketones, ur: NEGATIVE mg/dL
LEUKOCYTES UA: NEGATIVE
Nitrite: NEGATIVE
PROTEIN: NEGATIVE mg/dL
Specific Gravity, Urine: 1.025 (ref 1.005–1.030)
Urobilinogen, UA: 0.2 mg/dL (ref 0.0–1.0)
pH: 5.5 (ref 5.0–8.0)

## 2014-10-22 LAB — WET PREP, GENITAL
Clue Cells Wet Prep HPF POC: NONE SEEN
Trich, Wet Prep: NONE SEEN
Yeast Wet Prep HPF POC: NONE SEEN

## 2014-10-22 LAB — URINE MICROSCOPIC-ADD ON

## 2014-10-22 LAB — CBC
HEMATOCRIT: 35 % — AB (ref 36.0–46.0)
HEMOGLOBIN: 11.4 g/dL — AB (ref 12.0–15.0)
MCH: 27.5 pg (ref 26.0–34.0)
MCHC: 32.6 g/dL (ref 30.0–36.0)
MCV: 84.3 fL (ref 78.0–100.0)
PLATELETS: 279 10*3/uL (ref 150–400)
RBC: 4.15 MIL/uL (ref 3.87–5.11)
RDW: 14.1 % (ref 11.5–15.5)
WBC: 4.4 10*3/uL (ref 4.0–10.5)

## 2014-10-22 LAB — POCT PREGNANCY, URINE: Preg Test, Ur: NEGATIVE

## 2014-10-22 MED ORDER — IBUPROFEN 400 MG PO TABS: 400.0000 mg | ORAL_TABLET | Freq: Four times a day (QID) | ORAL | Status: DC | PRN

## 2014-10-22 NOTE — [facility] Note (Signed)
States she started her period yesterday and was late.  Vomited last night x 1.  Pt states she is very nauseated and lightheaded.  Pt states she has a lot of heartburn.  Left sided abd pain which woke her up last night.

## 2014-10-22 NOTE — Discharge Instructions (Signed)
Ovarian Cyst An ovarian cyst is a fluid-filled sac that forms on an ovary. The ovaries are small organs that produce eggs in women. Various types of cysts can form on the ovaries. Most are not cancerous. Many do not cause problems, and they often go away on their own. Some may cause symptoms and require treatment. Common types of ovarian cysts include:  Functional cysts--These cysts may occur every month during the menstrual cycle. This is normal. The cysts usually go away with the next menstrual cycle if the woman does not get pregnant. Usually, there are no symptoms with a functional cyst.  Endometrioma cysts--These cysts form from the tissue that lines the uterus. They are also called "chocolate cysts" because they become filled with blood that turns brown. This type of cyst can cause pain in the lower abdomen during intercourse and with your menstrual period.  Cystadenoma cysts--This type develops from the cells on the outside of the ovary. These cysts can get very big and cause lower abdomen pain and pain with intercourse. This type of cyst can twist on itself, cut off its blood supply, and cause severe pain. It can also easily rupture and cause a lot of pain.  Dermoid cysts--This type of cyst is sometimes found in both ovaries. These cysts may contain different kinds of body tissue, such as skin, teeth, hair, or cartilage. They usually do not cause symptoms unless they get very big.  Theca lutein cysts--These cysts occur when too much of a certain hormone (human chorionic gonadotropin) is produced and overstimulates the ovaries to produce an egg. This is most common after procedures used to assist with the conception of a baby (in vitro fertilization). CAUSES   Fertility drugs can cause a condition in which multiple large cysts are formed on the ovaries. This is called ovarian hyperstimulation syndrome.  A condition called polycystic ovary syndrome can cause hormonal imbalances that can lead to  nonfunctional ovarian cysts. SIGNS AND SYMPTOMS  Many ovarian cysts do not cause symptoms. If symptoms are present, they may include:  Pelvic pain or pressure.  Pain in the lower abdomen.  Pain during sexual intercourse.  Increasing girth (swelling) of the abdomen.  Abnormal menstrual periods.  Increasing pain with menstrual periods.  Stopping having menstrual periods without being pregnant. DIAGNOSIS  These cysts are commonly found during a routine or annual pelvic exam. Tests may be ordered to find out more about the cyst. These tests may include:  Ultrasound.  X-ray of the pelvis.  CT scan.  MRI.  Blood tests. TREATMENT  Many ovarian cysts go away on their own without treatment. Your health care provider may want to check your cyst regularly for 2-3 months to see if it changes. For women in menopause, it is particularly important to monitor a cyst closely because of the higher rate of ovarian cancer in menopausal women. When treatment is needed, it may include any of the following:  A procedure to drain the cyst (aspiration). This may be done using a long needle and ultrasound. It can also be done through a laparoscopic procedure. This involves using a thin, lighted tube with a tiny camera on the end (laparoscope) inserted through a small incision.  Surgery to remove the whole cyst. This may be done using laparoscopic surgery or an open surgery involving a larger incision in the lower abdomen.  Hormone treatment or birth control pills. These methods are sometimes used to help dissolve a cyst. HOME CARE INSTRUCTIONS   Only take over-the-counter   or prescription medicines as directed by your health care provider.  Follow up with your health care provider as directed.  Get regular pelvic exams and Pap tests. SEEK MEDICAL CARE IF:   Your periods are late, irregular, or painful, or they stop.  Your pelvic pain or abdominal pain does not go away.  Your abdomen becomes  larger or swollen.  You have pressure on your bladder or trouble emptying your bladder completely.  You have pain during sexual intercourse.  You have feelings of fullness, pressure, or discomfort in your stomach.  You lose weight for no apparent reason.  You feel generally ill.  You become constipated.  You lose your appetite.  You develop acne.  You have an increase in body and facial hair.  You are gaining weight, without changing your exercise and eating habits.  You think you are pregnant. SEEK IMMEDIATE MEDICAL CARE IF:   You have increasing abdominal pain.  You feel sick to your stomach (nauseous), and you throw up (vomit).  You develop a fever that comes on suddenly.  You have abdominal pain during a bowel movement.  Your menstrual periods become heavier than usual. MAKE SURE YOU:  Understand these instructions.  Will watch your condition.  Will get help right away if you are not doing well or get worse. Document Released: 04/10/2005 Document Revised: 04/15/2013 Document Reviewed: 12/16/2012 [employer] Patient Information 2015 [employer]. This information is not intended to replace advice given to you by your health care provider. Make sure you discuss any questions you have with your health care provider.  

## 2014-10-22 NOTE — MAU Provider Note (Signed)
History     CSN: [id number]  Arrival date and time: 10/22/14 0924   None     Chief Complaint  Patient presents with  . Possible Pregnancy  . Abdominal Pain   Abdominal Pain This is a new problem. The current episode started yesterday. The onset quality is sudden. The problem occurs intermittently. The problem has been unchanged. The pain is located in the LLQ. The pain is moderate. The quality of the pain is aching and cramping. The abdominal pain does not radiate. Associated symptoms include nausea and vomiting. Pertinent negatives include no constipation, diarrhea, dysuria, fever, headaches or myalgias. Nothing aggravates the pain. The pain is relieved by nothing. She has tried nothing for the symptoms.   This is a 24 y.o. female who presents with c/o Left lower quadrant pain which started yesterday, woke her up.  THe pain is crampy and dull. It waxes and wanes but is somewhat constant.  Also c/o late period which started yesterday.  Was 2 weeks late, so thinks she might be pregnant. Vomited once last night. Has nausea and some light headedness. No fever.   RN Note:  Expand All Collapse All   States she started her period yesterday and was late. Vomited last night x 1. Pt states she is very nauseated and lightheaded. Pt states she has a lot of heartburn. Left sided abd pain which woke her up last night.           OB History    Gravida Para Term Preterm AB TAB SAB Ectopic Multiple Living   1    1  1    0      Past Medical History  Diagnosis Date  . UTI (lower urinary tract infection)   . Abnormal Pap smear   . Anemia   . PCOS (polycystic ovarian syndrome)     Past Surgical History  Procedure Laterality Date  . Wisdom tooth extraction      Family History  Problem Relation Age of Onset  . Other Neg Hx   . Hypertension Paternal Grandmother   . Diabetes Paternal Grandfather     History  Substance Use Topics  . Smoking status: Never Smoker   . Smokeless  tobacco: Never Used  . Alcohol Use: No     Comment: occassional     Allergies:  Allergies  Allergen Reactions  . Vicodin [Hydrocodone-Acetaminophen] Nausea And Vomiting    No prescriptions prior to admission   Medical, Surgical, Family and Social histories reviewed and are listed above.  Medications and allergies reviewed.   Review of Systems  Constitutional: Negative for fever, chills and malaise/fatigue.  Respiratory: Negative for shortness of breath.   Cardiovascular: Negative for chest pain.  Gastrointestinal: Positive for nausea, vomiting and abdominal pain. Negative for diarrhea and constipation.  Genitourinary: Negative for dysuria and urgency.  Musculoskeletal: Negative for myalgias.  Neurological: Negative for dizziness, focal weakness, weakness and headaches.   Physical Exam   Blood pressure 124/78, pulse 64, temperature 98.3 F (36.8 C), temperature source Oral, resp. rate 18, last menstrual period 10/21/2014, SpO2 98 %.  Physical Exam  Constitutional: She is oriented to person, place, and time. She appears well-developed and well-nourished. No distress.  HENT:  Head: Normocephalic.  Cardiovascular: Normal rate, regular rhythm and normal heart sounds.   Respiratory: Effort normal. No respiratory distress. She has no wheezes. She has no rales.  GI: Soft. She exhibits no distension and no mass. There is tenderness (tender over LLQ). There is no rebound  and no guarding.  Genitourinary: Vagina normal. No vaginal discharge found.  Cervix long and closed Menstrual blood Uterus nontender, small, firm Left adnexa tender  Musculoskeletal: Normal range of motion.  Neurological: She is alert and oriented to person, place, and time.  Skin: Skin is warm and dry. She is not diaphoretic.  Psychiatric: She has a normal mood and affect.   + CMT LLQ very tender Uterus mod tender  MAU Course  Procedures  MDM  Results for orders placed or performed during the hospital  encounter of 10/22/14 (from the past 24 hour(s))  Urinalysis, Routine w reflex microscopic (not at [facility])     Status: Abnormal   Collection Time: 10/22/14  9:37 AM  Result Value Ref Range   Color, Urine YELLOW YELLOW   APPearance CLEAR CLEAR   Specific Gravity, Urine 1.025 1.005 - 1.030   pH 5.5 5.0 - 8.0   Glucose, UA NEGATIVE NEGATIVE mg/dL   Hgb urine dipstick LARGE (A) NEGATIVE   Bilirubin Urine NEGATIVE NEGATIVE   Ketones, ur NEGATIVE NEGATIVE mg/dL   Protein, ur NEGATIVE NEGATIVE mg/dL   Urobilinogen, UA 0.2 0.0 - 1.0 mg/dL   Nitrite NEGATIVE NEGATIVE   Leukocytes, UA NEGATIVE NEGATIVE  Urine microscopic-add on     Status: Abnormal   Collection Time: 10/22/14  9:37 AM  Result Value Ref Range   Squamous Epithelial / LPF FEW (A) RARE   RBC / HPF 11-20 <3 RBC/hpf   Bacteria, UA RARE RARE  Pregnancy, urine POC     Status: None   Collection Time: 10/22/14  9:40 AM  Result Value Ref Range   Preg Test, Ur NEGATIVE NEGATIVE  Wet prep, genital     Status: Abnormal   Collection Time: 10/22/14 11:44 AM  Result Value Ref Range   Yeast Wet Prep HPF POC NONE SEEN NONE SEEN   Trich, Wet Prep NONE SEEN NONE SEEN   Clue Cells Wet Prep HPF POC NONE SEEN NONE SEEN   WBC, Wet Prep HPF POC FEW (A) NONE SEEN  CBC     Status: Abnormal   Collection Time: 10/22/14 12:48 PM  Result Value Ref Range   WBC 4.4 4.0 - 10.5 K/uL   RBC 4.15 3.87 - 5.11 MIL/uL   Hemoglobin 11.4 (L) 12.0 - 15.0 g/dL   HCT 35.0 (L) 36.0 - 46.0 %   MCV 84.3 78.0 - 100.0 fL   MCH 27.5 26.0 - 34.0 pg   MCHC 32.6 30.0 - 36.0 g/dL   RDW 14.1 11.5 - 15.5 %   Platelets 279 150 - 400 K/uL    US Transvaginal Non-ob  10/22/2014   CLINICAL DATA:  LLQ pain, cervical tenderness  EXAM: TRANSABDOMINAL AND TRANSVAGINAL ULTRASOUND OF PELVIS  TECHNIQUE: Both transabdominal and transvaginal ultrasound examinations of the pelvis were performed. Transabdominal technique was performed for global imaging of the pelvis including uterus,  ovaries, adnexal regions, and pelvic cul-de-sac. It was necessary to proceed with endovaginal exam following the transabdominal exam to visualize the endometrium.  COMPARISON:  None  FINDINGS: Uterus  Measurements: 7.1 x 4.1 x 4.2 cm. No fibroids or other mass visualized.  Endometrium  Thickness: 11 mm.  No focal abnormality visualized.  Right ovary  Measurements: 3.4 x 2.2 x 2.6 cm. Normal appearance/no adnexal mass.  Left ovary  Measurements: 4.5 x 2.1 x 3.6 cm. 4.5 x 4.5 x 4.2 cm paraovarian simple cyst with possible small layering mural nodule (image 23).  Other findings  No free fluid.  IMPRESSION: 4.5 cm mildly complex left paraovarian cyst. Follow-up pelvic ultrasound is suggested in 6-12 weeks.   Electronically Signed   By: Julian Hy M.D.   On: 10/22/2014 13:09   US Pelvis Complete  10/22/2014   CLINICAL DATA:  LLQ pain, cervical tenderness  EXAM: TRANSABDOMINAL AND TRANSVAGINAL ULTRASOUND OF PELVIS  TECHNIQUE: Both transabdominal and transvaginal ultrasound examinations of the pelvis were performed. Transabdominal technique was performed for global imaging of the pelvis including uterus, ovaries, adnexal regions, and pelvic cul-de-sac. It was necessary to proceed with endovaginal exam following the transabdominal exam to visualize the endometrium.  COMPARISON:  None  FINDINGS: Uterus  Measurements: 7.1 x 4.1 x 4.2 cm. No fibroids or other mass visualized.  Endometrium  Thickness: 11 mm.  No focal abnormality visualized.  Right ovary  Measurements: 3.4 x 2.2 x 2.6 cm. Normal appearance/no adnexal mass.  Left ovary  Measurements: 4.5 x 2.1 x 3.6 cm. 4.5 x 4.5 x 4.2 cm paraovarian simple cyst with possible small layering mural nodule (image 23).  Other findings  No free fluid.  IMPRESSION: 4.5 cm mildly complex left paraovarian cyst. Follow-up pelvic ultrasound is suggested in 6-12 weeks.   Electronically Signed   By: Julian Hy M.D.   On: 10/22/2014 13:09    Assessment and Plan  A;  Left  lower quadrant pain       Left mildly complex paraovarian cyst       No evidence of infection or pregnancy        P:  Discussed findings with patient regarding results and ultrasound       Discussed need to followup with [place] on 6 weeks        Will use ibuprofen for pain, allergic to narcotics        [place] scheduled        Will schedule clinic followup after [place] completed   [facility] 10/22/2014, 11:12 AM

## 2014-10-23 LAB — GC/CHLAMYDIA PROBE AMP (~~LOC~~) NOT AT ARMC
Chlamydia: NEGATIVE
Neisseria Gonorrhea: NEGATIVE

## 2014-10-23 LAB — HIV ANTIBODY (ROUTINE TESTING W REFLEX): HIV SCREEN 4TH GENERATION: NONREACTIVE

## 2014-11-06 ENCOUNTER — Other Ambulatory Visit (HOSPITAL_COMMUNITY): Payer: Self-pay | Admitting: Advanced Practice Midwife

## 2014-11-06 ENCOUNTER — Ambulatory Visit (HOSPITAL_COMMUNITY): Admission: RE | Admit: 2014-11-06 | Payer: Medicaid Other | Source: Ambulatory Visit

## 2014-11-06 DIAGNOSIS — N83299 Other ovarian cyst, unspecified side: Secondary | ICD-10-CM

## 2014-11-11 ENCOUNTER — Emergency Department (HOSPITAL_COMMUNITY): Payer: BLUE CROSS/BLUE SHIELD

## 2014-11-11 ENCOUNTER — Emergency Department (HOSPITAL_COMMUNITY)
Admission: EM | Admit: 2014-11-11 | Discharge: 2014-11-11 | Disposition: A | Discharge status: Home / Self Care | Payer: BLUE CROSS/BLUE SHIELD | Attending: Emergency Medicine | Admitting: Emergency Medicine

## 2014-11-11 ENCOUNTER — Encounter (HOSPITAL_COMMUNITY): Payer: Self-pay | Admitting: Emergency Medicine

## 2014-11-11 DIAGNOSIS — Z79899 Other long term (current) drug therapy: Secondary | ICD-10-CM | POA: Insufficient documentation

## 2014-11-11 DIAGNOSIS — Z87440 Personal history of urinary (tract) infections: Secondary | ICD-10-CM | POA: Insufficient documentation

## 2014-11-11 DIAGNOSIS — E236 Other disorders of pituitary gland: Secondary | ICD-10-CM

## 2014-11-11 DIAGNOSIS — Z862 Personal history of diseases of the blood and blood-forming organs and certain disorders involving the immune mechanism: Secondary | ICD-10-CM | POA: Insufficient documentation

## 2014-11-11 DIAGNOSIS — R51 Headache: Secondary | ICD-10-CM | POA: Insufficient documentation

## 2014-11-11 DIAGNOSIS — R519 Headache, unspecified: Secondary | ICD-10-CM

## 2014-11-11 DIAGNOSIS — H5710 Ocular pain, unspecified eye: Secondary | ICD-10-CM | POA: Insufficient documentation

## 2014-11-11 MED ORDER — DIPHENHYDRAMINE HCL 50 MG/ML IJ SOLN
25.0000 mg | Freq: Once | INTRAMUSCULAR | Status: AC
  Administered 2014-11-11: 25 mg via INTRAVENOUS
  Filled 2014-11-11: qty 1

## 2014-11-11 MED ORDER — SODIUM CHLORIDE 0.9 % IV BOLUS (SEPSIS)
1000.0000 mL | Freq: Once | INTRAVENOUS | Status: AC
  Administered 2014-11-11: 1000 mL via INTRAVENOUS

## 2014-11-11 MED ORDER — FENTANYL CITRATE (PF) 100 MCG/2ML IJ SOLN
50.0000 ug | Freq: Once | INTRAMUSCULAR | Status: AC
  Administered 2014-11-11: 50 ug via INTRAVENOUS
  Filled 2014-11-11: qty 2

## 2014-11-11 MED ORDER — METOCLOPRAMIDE HCL 5 MG/ML IJ SOLN
10.0000 mg | Freq: Once | INTRAMUSCULAR | Status: AC
  Administered 2014-11-11: 10 mg via INTRAVENOUS
  Filled 2014-11-11: qty 2

## 2014-11-11 NOTE — Discharge Instructions (Signed)
Idiopathic Intracranial Hypertension Idiopathic intracranial hypertension (IIH) is a neurologic disorder that leads to increased pressure around your brain. It can cause vision loss and blindness if left untreated. RISK FACTORS IIH is most common in very overweight (obese) women of childbearing age. SIGNS AND SYMPTOMS  Symptoms of IIH include:  Headache.  Feeling of sickness in your stomach (nausea).  Vomiting.  A "rushing of water" sound within your ears (pulsatile tinnitus).  Double vision. DIAGNOSIS  Idiopathic intracranial hypertension is diagnosed with the aid of different exams:  Brain scans such as:  CT.  MRI.  MRV.  Diagnostic lumbar puncture. This procedure can determine if there is too much spinal fluid within the central nervous system. Too much spinal fluid can increase intracranial pressure.  A thorough eye exam will be done to look for swelling within the eyes. Visual field testing will also be done to see if any damage has occurred to nerves in the eyes. TREATMENT  Treatment of idiopathic intracranial hypertension is based on symptoms. Common treatments include:  Lumbar puncture to remove excess spinal fluid.  Medicine.  Surgery. HOME CARE INSTRUCTIONS The most important thing anyone can do to improve this condition is lose weight if they are overweight.  SEEK MEDICAL CARE IF:  You have changes in vision.  You have double vision.  You have loss of color vision. SEEK IMMEDIATE MEDICAL CARE IF:   Your headaches get worse rather than better.  Nausea or vomiting or both continue after treatment.  Your vision does not improve or gets worse after treatment. MAKE SURE YOU:  Understand these instructions.  Will watch your condition.  Will get help right away if you are not doing well or get worse. Document Released: 06/19/2001 Document Revised: 04/15/2013 Document Reviewed: 12/16/2012 [facility] Patient Information 2015 [place], [place]. This  information is not intended to replace advice given to you by your health care provider. Make sure you discuss any questions you have with your health care provider.   Headaches, Frequently Asked Questions MIGRAINE HEADACHES Q: What is migraine? What causes it? How can I treat it? A: Generally, migraine headaches begin as a dull ache. Then they develop into a constant, throbbing, and pulsating pain. You may experience pain at the temples. You may experience pain at the front or back of one or both sides of the head. The pain is usually accompanied by a combination of:  Nausea.  Vomiting.  Sensitivity to light and noise. Some people (about 15%) experience an aura (see below) before an attack. The cause of migraine is believed to be chemical reactions in the brain. Treatment for migraine may include over-the-counter or prescription medications. It may also include self-help techniques. These include relaxation training and biofeedback.  Q: What is an aura? A: About 15% of people with migraine get an "aura". This is a sign of neurological symptoms that occur before a migraine headache. You may see wavy or jagged lines, dots, or flashing lights. You might experience tunnel vision or blind spots in one or both eyes. The aura can include visual or auditory hallucinations (something imagined). It may include disruptions in smell (such as strange odors), taste or touch. Other symptoms include:  Numbness.  A "pins and needles" sensation.  Difficulty in recalling or speaking the correct word. These neurological events may last as long as 60 minutes. These symptoms will fade as the headache begins. Q: What is a trigger? A: Certain physical or environmental factors can lead to or "trigger" a migraine. These  include:  Foods.  Hormonal changes.  Weather.  Stress. It is important to remember that triggers are different for everyone. To help prevent migraine attacks, you need to figure out which  triggers affect you. Keep a headache diary. This is a good way to track triggers. The diary will help you talk to your healthcare professional about your condition. Q: Does weather affect migraines? A: Bright sunshine, hot, humid conditions, and drastic changes in barometric pressure may lead to, or "trigger," a migraine attack in some people. But studies have shown that weather does not act as a trigger for everyone with migraines. Q: What is the link between migraine and hormones? A: Hormones start and regulate many of your body's functions. Hormones keep your body in balance within a constantly changing environment. The levels of hormones in your body are unbalanced at times. Examples are during menstruation, pregnancy, or menopause. That can lead to a migraine attack. In fact, about three quarters of all women with migraine report that their attacks are related to the menstrual cycle.  Q: Is there an increased risk of stroke for migraine sufferers? A: The likelihood of a migraine attack causing a stroke is very remote. That is not to say that migraine sufferers cannot have a stroke associated with their migraines. In persons under age 57, the most common associated factor for stroke is migraine headache. But over the course of a person's normal life span, the occurrence of migraine headache may actually be associated with a reduced risk of dying from cerebrovascular disease due to stroke.  Q: What are acute medications for migraine? A: Acute medications are used to treat the pain of the headache after it has started. Examples over-the-counter medications, NSAIDs, ergots, and triptans.  Q: What are the triptans? A: Triptans are the newest class of abortive medications. They are specifically targeted to treat migraine. Triptans are vasoconstrictors. They moderate some chemical reactions in the brain. The triptans work on receptors in your brain. Triptans help to restore the balance of a neurotransmitter  called serotonin. Fluctuations in levels of serotonin are thought to be a main cause of migraine.  Q: Are over-the-counter medications for migraine effective? A: Over-the-counter, or "OTC," medications may be effective in relieving mild to moderate pain and associated symptoms of migraine. But you should see your caregiver before beginning any treatment regimen for migraine.  Q: What are preventive medications for migraine? A: Preventive medications for migraine are sometimes referred to as "prophylactic" treatments. They are used to reduce the frequency, severity, and length of migraine attacks. Examples of preventive medications include antiepileptic medications, antidepressants, beta-blockers, calcium channel blockers, and NSAIDs (nonsteroidal anti-inflammatory drugs). Q: Why are anticonvulsants used to treat migraine? A: During the past few years, there has been an increased interest in antiepileptic drugs for the prevention of migraine. They are sometimes referred to as "anticonvulsants". Both epilepsy and migraine may be caused by similar reactions in the brain.  Q: Why are antidepressants used to treat migraine? A: Antidepressants are typically used to treat people with depression. They may reduce migraine frequency by regulating chemical levels, such as serotonin, in the brain.  Q: What alternative therapies are used to treat migraine? A: The term "alternative therapies" is often used to describe treatments considered outside the scope of conventional Western medicine. Examples of alternative therapy include acupuncture, acupressure, and yoga. Another common alternative treatment is herbal therapy. Some herbs are believed to relieve headache pain. Always discuss alternative therapies with your caregiver before proceeding. Some  herbal products contain arsenic and other toxins. TENSION HEADACHES Q: What is a tension-type headache? What causes it? How can I treat it? A: Tension-type headaches occur  randomly. They are often the result of temporary stress, anxiety, fatigue, or anger. Symptoms include soreness in your temples, a tightening band-like sensation around your head (a "vice-like" ache). Symptoms can also include a pulling feeling, pressure sensations, and contracting head and neck muscles. The headache begins in your forehead, temples, or the back of your head and neck. Treatment for tension-type headache may include over-the-counter or prescription medications. Treatment may also include self-help techniques such as relaxation training and biofeedback. CLUSTER HEADACHES Q: What is a cluster headache? What causes it? How can I treat it? A: Cluster headache gets its name because the attacks come in groups. The pain arrives with little, if any, warning. It is usually on one side of the head. A tearing or bloodshot eye and a runny nose on the same side of the headache may also accompany the pain. Cluster headaches are believed to be caused by chemical reactions in the brain. They have been described as the most severe and intense of any headache type. Treatment for cluster headache includes prescription medication and oxygen. SINUS HEADACHES Q: What is a sinus headache? What causes it? How can I treat it? A: When a cavity in the bones of the face and skull (a sinus) becomes inflamed, the inflammation will cause localized pain. This condition is usually the result of an allergic reaction, a tumor, or an infection. If your headache is caused by a sinus blockage, such as an infection, you will probably have a fever. An x-ray will confirm a sinus blockage. Your caregiver's treatment might include antibiotics for the infection, as well as antihistamines or decongestants.  REBOUND HEADACHES Q: What is a rebound headache? What causes it? How can I treat it? A: A pattern of taking acute headache medications too often can lead to a condition known as "rebound headache." A pattern of taking too much  headache medication includes taking it more than 2 days per week or in excessive amounts. That means more than the label or a caregiver advises. With rebound headaches, your medications not only stop relieving pain, they actually begin to cause headaches. Doctors treat rebound headache by tapering the medication that is being overused. Sometimes your caregiver will gradually substitute a different type of treatment or medication. Stopping may be a challenge. Regularly overusing a medication increases the potential for serious side effects. Consult a caregiver if you regularly use headache medications more than 2 days per week or more than the label advises. ADDITIONAL QUESTIONS AND ANSWERS Q: What is biofeedback? A: Biofeedback is a self-help treatment. Biofeedback uses special equipment to monitor your body's involuntary physical responses. Biofeedback monitors:  Breathing.  Pulse.  Heart rate.  Temperature.  Muscle tension.  Brain activity. Biofeedback helps you refine and perfect your relaxation exercises. You learn to control the physical responses that are related to stress. Once the technique has been mastered, you do not need the equipment any more. Q: Are headaches hereditary? A: Four out of five (80%) of people that suffer report a family history of migraine. Scientists are not sure if this is genetic or a family predisposition. Despite the uncertainty, a child has a 50% chance of having migraine if one parent suffers. The child has a 75% chance if both parents suffer.  Q: Can children get headaches? A: By the time they reach high school,  most young people have experienced some type of headache. Many safe and effective approaches or medications can prevent a headache from occurring or stop it after it has begun.  Q: What type of doctor should I see to diagnose and treat my headache? A: Start with your primary caregiver. Discuss his or her experience and approach to headaches. Discuss  methods of classification, diagnosis, and treatment. Your caregiver may decide to recommend you to a headache specialist, depending upon your symptoms or other physical conditions. Having diabetes, allergies, etc., may require a more comprehensive and inclusive approach to your headache. The National Head[employer] Foundation will provide, upon request, a list of [facility] physician members in your state. Document Released: 07/01/2003 Document Revised: 07/03/2011 Document Reviewed: 12/09/2007 [facility] Patient Information 2015 [place], [place]. This information is not intended to replace advice given to you by your health care provider. Make sure you discuss any questions you have with your health care provider.

## 2014-11-11 NOTE — ED Provider Notes (Signed)
CSN: [id number]     Arrival date & time 11/11/14  1109 History   First MD Initiated Contact with Patient 11/11/14 1143     Chief Complaint  Patient presents with  . Headache     (Consider location/radiation/quality/duration/timing/severity/associated sxs/prior Treatment) Patient is a 24 y.o. female presenting with headaches. The history is provided by the patient. No language interpreter was used.  Headache Location: Right orbital. Quality:  Dull Radiates to:  R shoulder Pain severity now: Moderate to severe. Onset quality:  Gradual Duration:  1 week Timing:  Constant Progression:  Worsening Chronicity:  New Similar to prior headaches: no   Relieved by:  Nothing Worsened by:  Nothing Ineffective treatments:  None tried Associated symptoms: eye pain   Associated symptoms: no abdominal pain, no back pain, no congestion, no cough, no diarrhea, no facial pain, no fatigue, no fever, no nausea, no neck pain, no neck stiffness, no numbness, no paresthesias, no photophobia, no sore throat, no visual change, no vomiting and no weakness     Past Medical History  Diagnosis Date  . UTI (lower urinary tract infection)   . Abnormal Pap smear   . Anemia   . PCOS (polycystic ovarian syndrome)    Past Surgical History  Procedure Laterality Date  . Wisdom tooth extraction     Family History  Problem Relation Age of Onset  . Other Neg Hx   . Hypertension Paternal Grandmother   . Diabetes Paternal Grandfather    History  Substance Use Topics  . Smoking status: Never Smoker   . Smokeless tobacco: Never Used  . Alcohol Use: No     Comment: occassional    OB History    Gravida Para Term Preterm AB TAB SAB Ectopic Multiple Living   1    1  1    0     Review of Systems  Constitutional: Negative for fever, chills, diaphoresis, activity change, appetite change and fatigue.  HENT: Negative for congestion, facial swelling, rhinorrhea and sore throat.   Eyes: Positive for pain. Negative  for photophobia and discharge.  Respiratory: Negative for cough, chest tightness and shortness of breath.   Cardiovascular: Negative for chest pain, palpitations and leg swelling.  Gastrointestinal: Negative for nausea, vomiting, abdominal pain and diarrhea.  Endocrine: Negative for polydipsia and polyuria.  Genitourinary: Negative for dysuria, frequency, difficulty urinating and pelvic pain.  Musculoskeletal: Negative for back pain, arthralgias, neck pain and neck stiffness.  Skin: Negative for color change and wound.  Allergic/Immunologic: Negative for immunocompromised state.  Neurological: Positive for headaches. Negative for facial asymmetry, weakness, numbness and paresthesias.  Hematological: Does not bruise/bleed easily.  Psychiatric/Behavioral: Negative for confusion and agitation.      Allergies  Vicodin  Home Medications   Prior to Admission medications   Medication Sig Start Date End Date Taking? Authorizing Provider  acetaminophen (TYLENOL) 325 MG tablet Take 1,300 mg by mouth every 6 (six) hours as needed for moderate pain.   Yes Historical Provider, MD  ibuprofen (ADVIL,MOTRIN) 200 MG tablet Take 800 mg by mouth every 6 (six) hours as needed for moderate pain.   Yes Historical Provider, MD  metFORMIN (GLUCOPHAGE) 500 MG tablet TAKE 1 TABLET (500 MG TOTAL) BY MOUTH 2 (TWO) TIMES DAILY WITH A MEAL. 10/27/14  Yes Woodroe Mode, MD  ibuprofen (ADVIL,MOTRIN) 400 MG tablet Take 1 tablet (400 mg total) by mouth every 6 (six) hours as needed. Patient not taking: Reported on 11/11/2014 10/22/14   Seabron Spates, CNM  BP 104/67 mmHg  Pulse 70  Temp(Src) 99.3 F (37.4 C) (Oral)  Resp 16  SpO2 97%  LMP 10/21/2014 (Exact Date) Physical Exam  Constitutional: She is oriented to person, place, and time. She appears well-developed and well-nourished. No distress.  HENT:  Head: Normocephalic and atraumatic.  Mouth/Throat: No oropharyngeal exudate.  Eyes: Pupils are equal, round,  and reactive to light.  Neck: Normal range of motion. Neck supple.  Cardiovascular: Normal rate, regular rhythm and normal heart sounds.  Exam reveals no gallop and no friction rub.   No murmur heard. Pulmonary/Chest: Effort normal and breath sounds normal. No respiratory distress. She has no wheezes. She has no rales.  Abdominal: Soft. Bowel sounds are normal. She exhibits no distension and no mass. There is no tenderness. There is no rebound and no guarding.  Musculoskeletal: Normal range of motion. She exhibits no edema or tenderness.  Neurological: She is alert and oriented to person, place, and time. She has normal strength. She displays no tremor. No cranial nerve deficit or sensory deficit. She exhibits normal muscle tone. She displays a negative Romberg sign. Coordination and gait normal. GCS eye subscore is 4. GCS verbal subscore is 5. GCS motor subscore is 6.  Skin: Skin is warm and dry.  Psychiatric: She has a normal mood and affect.    ED Course  Procedures (including critical care time) Labs Review Labs Reviewed - No data to display  Imaging Review Ct Head Wo Contrast  11/11/2014   CLINICAL DATA:  Right temporal region headache radiating into right neck region for 5 days  EXAM: CT HEAD WITHOUT CONTRAST  TECHNIQUE: Contiguous axial images were obtained from the base of the skull through the vertex without intravenous contrast.  COMPARISON:  None.  FINDINGS: The ventricles are normal in size and configuration. There is mild invagination of CSF into the sella. There is no intracranial mass, hemorrhage, extra-axial fluid collection, or midline shift. Gray-white compartments are normal. There is no demonstrable acute infarct. Orbits appear symmetric and normal bilaterally. Bony calvarium appears intact. The mastoid air cells are clear. Visualized paranasal sinuses are clear.  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 The clinical significance of this finding is uncertain. Study  otherwise unremarkable. In particular, no mass, hemorrhage, or focal gray -white compartment lesions/ acute appearing infarct.   Electronically Signed   By: Lowella Grip III M.D.   On: 11/11/2014 14:12     EKG Interpretation None      MDM   Final diagnoses:  Acute nonintractable headache, unspecified headache type  Empty sella    Pt is a 24 y.o. female with Pmhx as above who presents with 5 days of a constant right orbital headache, with radiation to the right shoulder.  She's had some tearing of her right eye.  She denies fevers, chills, blurred vision, numbness, weakness.  On physical exam, vital signs are stable and she is in no acute distress.  Neuro exam is normal.  Will get a CT head as she has no significant past medical history of headaches.  We'll treat with migraine cocktail.   CT head with slight invagination of sella. Otherwise nml. Pt does have risk factors for ICH, including age, sex, and weight, however does not have visual symptoms, tinnitus, or other focal neuro complaints. I have discussed these findings with her and fact that LP could help diagnosis. Pt feeling much improved, as I feel pt ICP less likely plan will be to f/u with neurology as  outpt as well as optometry (has never had eye exam) and return to the ED should symptoms worsen including h/a, visual changes.     Ernestina Patches, MD 11/12/14 1140

## 2014-11-11 NOTE — ED Notes (Signed)
Pt c/o headache x 1 week without history of the same, states HA radiates into right shoulder, right temple, and right eye, prevents her from sleeping. Denies vision changes, n/v.

## 2014-12-03 ENCOUNTER — Ambulatory Visit (HOSPITAL_COMMUNITY)
Admission: RE | Admit: 2014-12-03 | Discharge: 2014-12-03 | Disposition: A | Discharge status: Home / Self Care | Payer: BLUE CROSS/BLUE SHIELD | Source: Ambulatory Visit | Attending: Advanced Practice Midwife | Admitting: Advanced Practice Midwife

## 2014-12-03 DIAGNOSIS — N8320 Unspecified ovarian cysts: Secondary | ICD-10-CM | POA: Insufficient documentation

## 2014-12-03 DIAGNOSIS — N83299 Other ovarian cyst, unspecified side: Secondary | ICD-10-CM | POA: Insufficient documentation

## 2014-12-11 ENCOUNTER — Encounter: Payer: Self-pay | Admitting: Obstetrics and Gynecology

## 2014-12-11 ENCOUNTER — Ambulatory Visit (INDEPENDENT_AMBULATORY_CARE_PROVIDER_SITE_OTHER): Payer: Self-pay | Admitting: Obstetrics and Gynecology

## 2014-12-11 VITALS — BP 126/79 | HR 91 | Temp 98.5°F | Ht 68.0 in | Wt 222.2 lb

## 2014-12-11 DIAGNOSIS — N83299 Other ovarian cyst, unspecified side: Secondary | ICD-10-CM

## 2014-12-11 DIAGNOSIS — N8320 Unspecified ovarian cysts: Secondary | ICD-10-CM

## 2014-12-11 NOTE — Progress Notes (Signed)
Patient ID: Carla Henderson, female   DOB: [birth year], 24 y.o.   MRN: [id number] 24 yo G1P0010 presenting today to discuss ultrasound results and further management of her left ovarian cyst. Patient reports persistent abdominal pain unchanged from previous visits. She continues to take ibuprofen almost daily to manage her discomfort.  Past Medical History  Diagnosis Date  . UTI (lower urinary tract infection)   . Abnormal Pap smear   . Anemia   . PCOS (polycystic ovarian syndrome)    Past Surgical History  Procedure Laterality Date  . Wisdom tooth extraction     Family History  Problem Relation Age of Onset  . Other Neg Hx   . Hypertension Paternal Grandmother   . Diabetes Paternal Grandfather    Social History  Substance Use Topics  . Smoking status: Never Smoker   . Smokeless tobacco: Never Used  . Alcohol Use: No     Comment: occassional      Blood pressure 126/79, pulse 91, temperature 98.5 F (36.9 C), temperature source Oral, height 5' 8"  (1.727 m), weight 222 lb 3.2 oz (100.789 kg), last menstrual period 11/25/2014. GENERAL: Well-developed, well-nourished female in no acute distress.  ABDOMEN: Soft, nontender, nondistended.  PELVIC: Normal external female genitalia. Vagina is pink and rugated.  Normal discharge. Normal appearing cervix. Uterus is normal in size. Fullness in left adnexal region with mild tenderness.  EXTREMITIES: No cyanosis, clubbing, or edema, 2+ distal pulses.   12/03/2014 ultrasound Uterus  Measurements: 7.9 x 4.3 x 5.0 cm. The anteverted anteflexed uterus is normal in size and configuration. There are no uterine fibroids or other myometrial abnormalities.  Endometrium  Thickness: 8 mm. The bilayer endometrial thickness is within normal limits for stage of menstrual cycle. No focal endometrial abnormality or endometrial cavity fluid.  Right ovary  Measurements: 4.2 x 2.7 x 2.6 cm. Normal appearance/no adnexal mass.  Left  ovary  Measurements: 5.7 x 2.7 x 2.2 cm. Adjacent to the superior aspect of the left ovary, there is a complex 5.0 x 4.1 x 4.2 cm cystic structure, which demonstrates two stable mural nodules measuring 12 mm and 5 mm, which is overall slightly increased in size compared to the 10/22/2014 scan where it measured 4.5 x 4.5 x 4.2 cm.  Other findings: No free fluid  IMPRESSION: 1. Slight interval growth of the indeterminate complex 5.0 cm left adnexal cystic structure with internal mural nodules, which is located adjacent to the superior aspect of the left ovary. An exophytic left ovarian neoplasm cannot be excluded. If not resected, continued close sonographic follow-up is advised. 2. Otherwise normal pelvic sonogram. No free fluid in the pelvis. These results will be called to the ordering clinician or representative by the Radiologist Assistant, and communication documented in the PACS or zVision Dashboard.  A/P 24 yo with persistent left ovarian cyst - Discussed surgical management with laparoscopic ovarian cystectomy. Risks, benefits and alternatives were explained including but not limited to risks of bleeding, infection and damage to adjacent organs. Patient also understands that there is a possibility of oophorectomy. Patient verbalized understanding and will be scheduled for the procedure. All questions were answered.

## 2014-12-15 ENCOUNTER — Encounter (HOSPITAL_COMMUNITY): Payer: Self-pay | Admitting: *Deleted

## 2014-12-23 ENCOUNTER — Telehealth: Payer: Self-pay | Admitting: *Deleted

## 2014-12-23 NOTE — Telephone Encounter (Addendum)
Pt left message stating that she is scheduled for surgery on 01/26/15 for Lt ovarian cyst removal. She wants to know how long her recovery time will be or if she may return to work the next [employer].   9/2  1120  Returned pt's call after consult with Dr. Elly Modena. I informed her that she will need to be out of work for 2 weeks following the surgery (laparoscopic ovarian cystectomy) on 10/4.  Pt stated that she has a new job and cannot be out for that long. 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 She has talked with [place] @ Ob-Gyn office and could not get the surgery re-scheduled with Dr. Elly Modena within that time frame. She asked if another doctor could perform the surgery to mees her scheduling needs. I stated that she will need to discuss with [place]. Pt voiced understanding.

## 2015-01-01 ENCOUNTER — Encounter (HOSPITAL_COMMUNITY)
Admission: RE | Admit: 2015-01-01 | Discharge: 2015-01-01 | Disposition: A | Discharge status: Home / Self Care | Payer: Medicaid Other | Source: Ambulatory Visit | Attending: Obstetrics and Gynecology | Admitting: Obstetrics and Gynecology

## 2015-01-01 ENCOUNTER — Encounter (HOSPITAL_COMMUNITY): Payer: Self-pay

## 2015-01-01 DIAGNOSIS — N8320 Unspecified ovarian cysts: Secondary | ICD-10-CM | POA: Insufficient documentation

## 2015-01-01 DIAGNOSIS — Z01818 Encounter for other preprocedural examination: Secondary | ICD-10-CM | POA: Insufficient documentation

## 2015-01-01 LAB — BASIC METABOLIC PANEL
Anion gap: 10 (ref 5–15)
BUN: 14 mg/dL (ref 6–20)
CALCIUM: 9.9 mg/dL (ref 8.9–10.3)
CO2: 28 mmol/L (ref 22–32)
Chloride: 101 mmol/L (ref 101–111)
Creatinine, Ser: 0.82 mg/dL (ref 0.44–1.00)
GFR calc Af Amer: >60 mL/min (ref >60)
GLUCOSE: 93 mg/dL (ref 65–99)
Potassium: 4.5 mmol/L (ref 3.5–5.1)
Sodium: 139 mmol/L (ref 135–145)

## 2015-01-01 LAB — CBC
HCT: 33.4 % — ABNORMAL LOW (ref 36.0–46.0)
Hemoglobin: 10.6 g/dL — ABNORMAL LOW (ref 12.0–15.0)
MCH: 26.6 pg (ref 26.0–34.0)
MCHC: 31.7 g/dL (ref 30.0–36.0)
MCV: 83.9 fL (ref 78.0–100.0)
Platelets: 293 10*3/uL (ref 150–400)
RBC: 3.98 MIL/uL (ref 3.87–5.11)
RDW: 14.3 % (ref 11.5–15.5)
WBC: 4.2 10*3/uL (ref 4.0–10.5)

## 2015-01-01 NOTE — Patient Instructions (Addendum)
Your procedure is scheduled on:  January 07, 2015   Enter through the Main Entrance of [facility] at:  9:30am   Pick up the phone at the desk and dial [phone].  Call this number if you have problems the morning of surgery: [phone].  Remember: Do NOT eat food: after midnight on Wednesday  Do NOT drink clear liquids after: midnight on Wednesday  Hold metformin for 24 hours prior to surgery  Take these medicines the morning of surgery with a SIP OF WATER:  None   Do NOT wear jewelry (body piercing), metal hair clips/bobby pins, make-up, or nail polish. Do NOT wear lotions, powders, or perfumes.  You may wear deoderant. Do NOT shave for 48 hours prior to surgery. Do NOT bring valuables to the hospital. Have a responsible adult drive you home and stay with you for 24 hours after your procedure

## 2015-01-04 NOTE — H&P (Signed)
Carla Henderson is an 24 y.o. female [place] presenting today for scheduled surgical intervention for the management of a persistent left ovarian cyst. Patient reports persistent daily left lower quadrant pain which she manages with ibuprofen.  Pertinent Gynecological History: Menses: regular every month without intermenstrual spotting Bleeding: normal Contraception: none DES exposure: denies Blood transfusions: none Sexually transmitted diseases: no past history Previous GYN Procedures: DNC  OB History: G1, P0010   Menstrual History: Patient's last menstrual period was 11/25/2014 (exact date).    Past Medical History  Diagnosis Date  . UTI (lower urinary tract infection)   . Abnormal Pap smear   . Anemia   . PCOS (polycystic ovarian syndrome)     Past Surgical History  Procedure Laterality Date  . Wisdom tooth extraction      Family History  Problem Relation Age of Onset  . Other Neg Hx   . Hypertension Paternal Grandmother   . Diabetes Paternal Grandfather     Social History:  reports that she has never smoked. She has never used smokeless tobacco. She reports that she does not drink alcohol or use illicit drugs.  Allergies:  Allergies  Allergen Reactions  . Vicodin [Hydrocodone-Acetaminophen] Nausea And Vomiting and Swelling    Prescriptions prior to admission  Medication Sig Dispense Refill Last Dose  . acetaminophen (TYLENOL) 500 MG tablet Take 1,500 mg by mouth every 6 (six) hours as needed for moderate pain or headache.   12/31/2014  . [place] (VITEX EXTRACT PO) Take 3 tablets by mouth daily.   12/31/2014  . ibuprofen (ADVIL,MOTRIN) 200 MG tablet Take 600 mg by mouth every 6 (six) hours as needed for moderate pain.    12/31/2014  . metFORMIN (GLUCOPHAGE) 500 MG tablet TAKE 1 TABLET (500 MG TOTAL) BY MOUTH 2 (TWO) TIMES DAILY WITH A MEAL. 60 tablet 1 12/31/2014 at Unknown time  . ibuprofen (ADVIL,MOTRIN) 400 MG tablet Take 1 tablet (400 mg total) by mouth every 6  (six) hours as needed. (Patient not taking: Reported on 12/11/2014) 30 tablet 0 Not Taking at Unknown time    ROS See pertinent in HPI  Blood pressure 133/86, pulse 82, temperature 98.2 F (36.8 C), temperature source Oral, resp. rate 18, last menstrual period 11/25/2014, SpO2 100 %. Physical Exam GENERAL: Well-developed, well-nourished female in no acute distress.  HEENT: Normocephalic, atraumatic. Sclerae anicteric.  NECK: Supple. Normal thyroid.  LUNGS: Clear to auscultation bilaterally.  HEART: Regular rate and rhythm. ABDOMEN: Soft, nontender, nondistended. No organomegaly. PELVIC: Deferred to OR EXTREMITIES: No cyanosis, clubbing, or edema, 2+ distal pulses.  12/03/2014 ultrasound Uterus  Measurements: 7.9 x 4.3 x 5.0 cm. The anteverted anteflexed uterus is normal in size and configuration. There are no uterine fibroids or other myometrial abnormalities.  Endometrium  Thickness: 8 mm. The bilayer endometrial thickness is within normal limits for stage of menstrual cycle. No focal endometrial abnormality or endometrial cavity fluid.  Right ovary  Measurements: 4.2 x 2.7 x 2.6 cm. Normal appearance/no adnexal mass.  Left ovary  Measurements: 5.7 x 2.7 x 2.2 cm. Adjacent to the superior aspect of the left ovary, there is a complex 5.0 x 4.1 x 4.2 cm cystic structure, which demonstrates two stable mural nodules measuring 12 mm and 5 mm, which is overall slightly increased in size compared to the 10/22/2014 scan where it measured 4.5 x 4.5 x 4.2 cm.  Other findings: No free fluid  IMPRESSION: 1. Slight interval growth of the indeterminate complex 5.0 cm left adnexal cystic structure  with internal mural nodules, which is located adjacent to the superior aspect of the left ovary. An exophytic left ovarian neoplasm cannot be excluded. If not resected, continued close sonographic follow-up is advised. 2. Otherwise normal pelvic sonogram. No free fluid in the  pelvis. These results will be called to the ordering clinician or representative by the Radiologist Assistant, and communication documented in the PACS or zVision Dashboard.  Results for orders placed or performed during the hospital encounter of 01/07/15 (from the past 24 hour(s))  Pregnancy, urine     Status: None   Collection Time: 01/07/15  9:28 AM  Result Value Ref Range   Preg Test, Ur NEGATIVE NEGATIVE    No results found.  Assessment/Plan: 24 yo with persistent complex left ovarian cyst here for laparoscopic left ovarian cystectomy - Risks benefits and alternatives were explained including but not limited to risks of bleeding, infection and damage to adjacent organs. Patient is aware of the possibility of an oophorectomy. Patient verbalized understanding and all questions were answered.    Carla Henderson 01/07/2015, 12:09 PM

## 2015-01-05 ENCOUNTER — Telehealth: Payer: Self-pay | Admitting: *Deleted

## 2015-01-05 NOTE — Telephone Encounter (Signed)
Carla Henderson called this am and left a voice message stating she is having surgery Thursday to remove an ovarian cyst. States she has steps at her appartment and she wants to know if she can walk up them or does she need to make other accomodations.    [facility] and we discussed she should be able to walk up the steps although she should limit the number of times she goes up/down steps and she should take her time and not carry any items while she goes up/down steps. She voices understanding.

## 2015-01-07 ENCOUNTER — Encounter (HOSPITAL_COMMUNITY): Payer: Self-pay | Admitting: *Deleted

## 2015-01-07 ENCOUNTER — Ambulatory Visit (HOSPITAL_COMMUNITY): Payer: Self-pay | Admitting: Anesthesiology

## 2015-01-07 ENCOUNTER — Encounter (HOSPITAL_COMMUNITY)
Admission: RE | Disposition: A | Discharge status: Home / Self Care | Payer: Self-pay | Source: Ambulatory Visit | Attending: Obstetrics and Gynecology

## 2015-01-07 ENCOUNTER — Ambulatory Visit (HOSPITAL_COMMUNITY): Payer: Medicaid Other | Admitting: Anesthesiology

## 2015-01-07 ENCOUNTER — Observation Stay (HOSPITAL_COMMUNITY)
Admission: RE | Admit: 2015-01-07 | Discharge: 2015-01-08 | Disposition: A | Discharge status: Home / Self Care | Payer: Self-pay | Source: Ambulatory Visit | Attending: Obstetrics and Gynecology | Admitting: Obstetrics and Gynecology

## 2015-01-07 DIAGNOSIS — N83202 Unspecified ovarian cyst, left side: Secondary | ICD-10-CM | POA: Insufficient documentation

## 2015-01-07 DIAGNOSIS — Z79899 Other long term (current) drug therapy: Secondary | ICD-10-CM | POA: Insufficient documentation

## 2015-01-07 DIAGNOSIS — Z885 Allergy status to narcotic agent status: Secondary | ICD-10-CM | POA: Insufficient documentation

## 2015-01-07 DIAGNOSIS — N8320 Unspecified ovarian cysts: Secondary | ICD-10-CM

## 2015-01-07 DIAGNOSIS — D271 Benign neoplasm of left ovary: Principal | ICD-10-CM | POA: Insufficient documentation

## 2015-01-07 DIAGNOSIS — Z9889 Other specified postprocedural states: Secondary | ICD-10-CM

## 2015-01-07 HISTORY — PX: LAPAROSCOPIC OVARIAN CYSTECTOMY: SHX6248

## 2015-01-07 LAB — PREGNANCY, URINE: Preg Test, Ur: NEGATIVE

## 2015-01-07 SURGERY — EXCISION, CYST, OVARY, LAPAROSCOPIC
Anesthesia: General | Site: Abdomen | Laterality: Left

## 2015-01-07 MED ORDER — ROCURONIUM BROMIDE 100 MG/10ML IV SOLN
INTRAVENOUS | Status: DC | PRN
  Administered 2015-01-07: 10 mg via INTRAVENOUS
  Administered 2015-01-07: 20 mg via INTRAVENOUS
  Administered 2015-01-07: 10 mg via INTRAVENOUS

## 2015-01-07 MED ORDER — PROMETHAZINE HCL 25 MG/ML IJ SOLN: 6.2500 mg | INTRAMUSCULAR | Status: DC | PRN

## 2015-01-07 MED ORDER — ONDANSETRON HCL 4 MG/2ML IJ SOLN
INTRAMUSCULAR | Status: DC | PRN
  Administered 2015-01-07: 4 mg via INTRAVENOUS

## 2015-01-07 MED ORDER — LACTATED RINGERS IV SOLN
INTRAVENOUS | Status: DC
Start: 2015-01-07 — End: 2015-01-08
  Administered 2015-01-08: 100 mL/h via INTRAVENOUS

## 2015-01-07 MED ORDER — FENTANYL CITRATE (PF) 250 MCG/5ML IJ SOLN
INTRAMUSCULAR | Status: AC
  Filled 2015-01-07: qty 25

## 2015-01-07 MED ORDER — BUPIVACAINE HCL (PF) 0.25 % IJ SOLN
INTRAMUSCULAR | Status: AC
  Filled 2015-01-07: qty 30

## 2015-01-07 MED ORDER — HYDROMORPHONE HCL 1 MG/ML IJ SOLN
INTRAMUSCULAR | Status: AC
  Filled 2015-01-07: qty 1

## 2015-01-07 MED ORDER — MIDAZOLAM HCL 2 MG/2ML IJ SOLN
INTRAMUSCULAR | Status: DC | PRN
  Administered 2015-01-07: 2 mg via INTRAVENOUS

## 2015-01-07 MED ORDER — 0.9 % SODIUM CHLORIDE (POUR BTL) OPTIME
TOPICAL | Status: DC | PRN
  Administered 2015-01-07: 1000 mL

## 2015-01-07 MED ORDER — LACTATED RINGERS IV SOLN
INTRAVENOUS | Status: DC
  Administered 2015-01-07 (×4): via INTRAVENOUS

## 2015-01-07 MED ORDER — DEXAMETHASONE SODIUM PHOSPHATE 10 MG/ML IJ SOLN
INTRAMUSCULAR | Status: DC | PRN
  Administered 2015-01-07: 4 mg via INTRAVENOUS

## 2015-01-07 MED ORDER — PROPOFOL 10 MG/ML IV BOLUS
INTRAVENOUS | Status: DC | PRN
  Administered 2015-01-07: 200 mg via INTRAVENOUS

## 2015-01-07 MED ORDER — GLYCOPYRROLATE 0.2 MG/ML IJ SOLN
INTRAMUSCULAR | Status: AC
  Filled 2015-01-07: qty 3

## 2015-01-07 MED ORDER — ROCURONIUM BROMIDE 100 MG/10ML IV SOLN
INTRAVENOUS | Status: AC
  Filled 2015-01-07: qty 1

## 2015-01-07 MED ORDER — CEFAZOLIN SODIUM-DEXTROSE 2-3 GM-% IV SOLR
INTRAVENOUS | Status: AC
  Filled 2015-01-07: qty 50

## 2015-01-07 MED ORDER — SUCCINYLCHOLINE CHLORIDE 20 MG/ML IJ SOLN
INTRAMUSCULAR | Status: DC | PRN
  Administered 2015-01-07: 200 mg via INTRAVENOUS

## 2015-01-07 MED ORDER — FENTANYL CITRATE (PF) 100 MCG/2ML IJ SOLN
INTRAMUSCULAR | Status: DC | PRN
  Administered 2015-01-07: 50 ug via INTRAVENOUS
  Administered 2015-01-07: 100 ug via INTRAVENOUS
  Administered 2015-01-07 (×2): 50 ug via INTRAVENOUS

## 2015-01-07 MED ORDER — NEOSTIGMINE METHYLSULFATE 10 MG/10ML IV SOLN
INTRAVENOUS | Status: AC
  Filled 2015-01-07: qty 1

## 2015-01-07 MED ORDER — HYDROMORPHONE HCL 1 MG/ML IJ SOLN
1.0000 mg | Freq: Once | INTRAMUSCULAR | Status: AC
  Administered 2015-01-07: 1 mg via INTRAVENOUS
  Filled 2015-01-07: qty 1

## 2015-01-07 MED ORDER — FENTANYL CITRATE (PF) 100 MCG/2ML IJ SOLN
25.0000 ug | INTRAMUSCULAR | Status: DC | PRN
  Administered 2015-01-07 (×2): 50 ug via INTRAVENOUS

## 2015-01-07 MED ORDER — FENTANYL CITRATE (PF) 100 MCG/2ML IJ SOLN
INTRAMUSCULAR | Status: AC
  Filled 2015-01-07: qty 2

## 2015-01-07 MED ORDER — HYDROMORPHONE HCL 1 MG/ML IJ SOLN
INTRAMUSCULAR | Status: DC | PRN
  Administered 2015-01-07: 1 mg via INTRAVENOUS

## 2015-01-07 MED ORDER — SCOPOLAMINE 1 MG/3DAYS TD PT72
MEDICATED_PATCH | TRANSDERMAL | Status: AC
  Administered 2015-01-07: 1.5 mg via TRANSDERMAL
  Filled 2015-01-07: qty 1

## 2015-01-07 MED ORDER — IBUPROFEN 600 MG PO TABS
600.0000 mg | ORAL_TABLET | Freq: Four times a day (QID) | ORAL | Status: DC | PRN
  Administered 2015-01-08: 600 mg via ORAL
  Filled 2015-01-07: qty 1

## 2015-01-07 MED ORDER — PROPOFOL 10 MG/ML IV BOLUS
INTRAVENOUS | Status: AC
  Filled 2015-01-07: qty 20

## 2015-01-07 MED ORDER — SCOPOLAMINE 1 MG/3DAYS TD PT72
1.0000 | MEDICATED_PATCH | Freq: Once | TRANSDERMAL | Status: DC
  Administered 2015-01-07: 1.5 mg via TRANSDERMAL

## 2015-01-07 MED ORDER — LIDOCAINE HCL (CARDIAC) 20 MG/ML IV SOLN
INTRAVENOUS | Status: DC | PRN
Start: 2015-01-07 — End: 2015-01-07
  Administered 2015-01-07: 100 mg via INTRAVENOUS

## 2015-01-07 MED ORDER — KETOROLAC TROMETHAMINE 30 MG/ML IJ SOLN
INTRAMUSCULAR | Status: AC
  Filled 2015-01-07: qty 1

## 2015-01-07 MED ORDER — DEXAMETHASONE SODIUM PHOSPHATE 4 MG/ML IJ SOLN
INTRAMUSCULAR | Status: AC
  Filled 2015-01-07: qty 1

## 2015-01-07 MED ORDER — NEOSTIGMINE METHYLSULFATE 10 MG/10ML IV SOLN
INTRAVENOUS | Status: DC | PRN
  Administered 2015-01-07: 4 mg via INTRAVENOUS

## 2015-01-07 MED ORDER — ONDANSETRON HCL 4 MG/2ML IJ SOLN
INTRAMUSCULAR | Status: AC
  Filled 2015-01-07: qty 2

## 2015-01-07 MED ORDER — GLYCOPYRROLATE 0.2 MG/ML IJ SOLN
INTRAMUSCULAR | Status: DC | PRN
  Administered 2015-01-07: .6 mg via INTRAVENOUS

## 2015-01-07 MED ORDER — LIDOCAINE HCL (CARDIAC) 20 MG/ML IV SOLN
INTRAVENOUS | Status: AC
  Filled 2015-01-07: qty 5

## 2015-01-07 MED ORDER — MIDAZOLAM HCL 2 MG/2ML IJ SOLN
INTRAMUSCULAR | Status: AC
  Filled 2015-01-07: qty 4

## 2015-01-07 MED ORDER — METFORMIN HCL 500 MG PO TABS
500.0000 mg | ORAL_TABLET | Freq: Two times a day (BID) | ORAL | Status: DC
  Administered 2015-01-08: 500 mg via ORAL
  Filled 2015-01-07 (×2): qty 1

## 2015-01-07 MED ORDER — CEFAZOLIN SODIUM-DEXTROSE 2-3 GM-% IV SOLR
INTRAVENOUS | Status: DC | PRN
  Administered 2015-01-07: 2 g via INTRAVENOUS

## 2015-01-07 MED ORDER — OXYCODONE-ACETAMINOPHEN 5-325 MG PO TABS
1.0000 | ORAL_TABLET | ORAL | Status: DC | PRN
  Administered 2015-01-07 – 2015-01-08 (×3): 2 via ORAL
  Filled 2015-01-07 (×3): qty 2

## 2015-01-07 SURGICAL SUPPLY — 32 items
BAG SPEC RTRVL LRG 6X4 10 (ENDOMECHANICALS)
CHLORAPREP W/TINT 26ML (MISCELLANEOUS) ×2 IMPLANT
DRSG COVADERM PLUS 2X2 (GAUZE/BANDAGES/DRESSINGS) ×4 IMPLANT
DRSG OPSITE POSTOP 3X4 (GAUZE/BANDAGES/DRESSINGS) IMPLANT
DRSG OPSITE POSTOP 4X10 (GAUZE/BANDAGES/DRESSINGS) ×1 IMPLANT
ELECT REM PT RETURN 9FT ADLT (ELECTROSURGICAL) ×2
ELECTRODE REM PT RTRN 9FT ADLT (ELECTROSURGICAL) ×1 IMPLANT
GLOVE BIOGEL PI IND STRL 6.5 (GLOVE) ×1 IMPLANT
GLOVE BIOGEL PI INDICATOR 6.5 (GLOVE) ×1
GLOVE SURG SS PI 6.0 STRL IVOR (GLOVE) ×2 IMPLANT
GOWN STRL REUS W/TWL LRG LVL3 (GOWN DISPOSABLE) ×4 IMPLANT
LIQUID BAND (GAUZE/BANDAGES/DRESSINGS) ×2 IMPLANT
NS IRRIG 1000ML POUR BTL (IV SOLUTION) ×2 IMPLANT
PACK LAPAROSCOPY BASIN (CUSTOM PROCEDURE TRAY) ×2 IMPLANT
PAD ABD 7.5X8 STRL (GAUZE/BANDAGES/DRESSINGS) ×2 IMPLANT
PAD POSITIONING PINK XL (MISCELLANEOUS) ×2 IMPLANT
POUCH SPECIMEN RETRIEVAL 10MM (ENDOMECHANICALS) IMPLANT
SEALER TISSUE G2 CVD JAW 35 (ENDOMECHANICALS) IMPLANT
SEALER TISSUE G2 CVD JAW 45CM (ENDOMECHANICALS)
SET IRRIG TUBING LAPAROSCOPIC (IRRIGATION / IRRIGATOR) IMPLANT
SHEARS HARMONIC ACE PLUS 36CM (ENDOMECHANICALS) IMPLANT
SUT MNCRL AB 4-0 PS2 18 (SUTURE) ×2 IMPLANT
SUT VICRYL 0 UR6 27IN ABS (SUTURE) ×4 IMPLANT
TAPE CLOTH SURG 4X10 WHT LF (GAUZE/BANDAGES/DRESSINGS) ×1 IMPLANT
TOWEL OR 17X24 6PK STRL BLUE (TOWEL DISPOSABLE) ×4 IMPLANT
TRAY FOLEY CATH SILVER 14FR (SET/KITS/TRAYS/PACK) ×2 IMPLANT
TROCAR 12M 150ML BLUNT (TROCAR) ×2 IMPLANT
TROCAR BALLN 12MMX100 BLUNT (TROCAR) ×2 IMPLANT
TROCAR BALLN GELPORT 12X130M (ENDOMECHANICALS) ×1 IMPLANT
TROCAR XCEL 12X100 BLDLESS (ENDOMECHANICALS) ×1 IMPLANT
TROCAR XCEL NON-BLD 5MMX100MML (ENDOMECHANICALS) ×1 IMPLANT
WATER STERILE IRR 1000ML POUR (IV SOLUTION) ×2 IMPLANT

## 2015-01-07 NOTE — Op Note (Signed)
Clarrissa Willhite PROCEDURE DATE: 01/07/2015  PREOPERATIVE DIAGNOSIS:  24 y.o. G1P0010 with persistent left ovarian cyst POSTOPERATIVE DIAGNOSIS:  Same SURGEON:   Mora Bellman, M.D. ASSISTANT:   Dr. Ihor Dow OPERATION:  Attempted laparoscopy, mini-laparotomy with left ovarian cystectomy ANESTHESIA:  General endotracheal.  INDICATIONS: The patient is a 24 y.o. G1P0010 with a persistent left complex ovarian cyst. The patient made a decision to undergo definite surgical treatment. On the preoperative visit, the risks, benefits, indications, and alternatives of the procedure were reviewed with the patient.  On the day of surgery, the risks of surgery were again discussed with the patient including but not limited to: bleeding which may require transfusion or reoperation; infection which may require antibiotics; injury to bowel, bladder, ureters or other surrounding organs; need for additional procedures; thromboembolic phenomenon, incisional problems and other postoperative/anesthesia complications. Written informed consent was obtained.    OPERATIVE FINDINGS: A 8 week size uterus with normal tubes and ovaries bilaterally with a 5 cm left ovarian cyst  ESTIMATED BLOOD LOSS: 25 ml FLUIDS:  2000 ml of Lactated Ringers URINE OUTPUT:  200 ml of clear yellow urine. SPECIMENS:  Left ovarian cyst sent to pathology COMPLICATIONS:  None immediate.   DESCRIPTION OF PROCEDURE: The patient was taken to the operating room where general anesthesia was obtained without difficulty.  She was then placed in the dorsal lithotomy position and prepared and draped in sterile fashion.  After an adequate timeout was performed, a bivalved speculum was then placed in the patient's vagina, and the anterior lip of cervix grasped with the single-tooth tenaculum.  The uterine manipulator was then advanced into the uterus.  The speculum was removed from the vagina.  Attention was then turned to the patient's abdomen where a  10-mm skin incision was made on the umbilical fold.  The underlying fascia was identified, grasped with Kocher clamps, tented up and entered sharply with mayo scissors. The fascia was tagged with 0-Vicryl. The peritoneum was entered bluntly.The 11 mm trocar and sleeve were introduced into the abdominal cavityl.  Intraperitoneal placement was never confirmed with the use of the laparoscope. A 5-mm skin incision was made in the right upper quadrant and a 5 mm trocar and sleeve were introduced under direct visualization. Again intraperitoneal placement could not be confirmed. The decision was made to cover to a laparotomy. The fascial incision at the umbilicus was repaired with 0 Vicryl, and the skin was closed with 4-0 Vicryl. The uterine manipulator and the tenaculum were removed from the vagina without complications.The patient received intravenous antibiotics. A  pfannensteil skin incision was made. This incision was taken down to the fascia using electrocautery with care given to maintain good hemostasis. The fascia was incised in the midline and the fascial incision was then extended bilaterally using mayo scissors. The fascia was then dissected off the underlying rectus muscles using blunt and sharp dissection. The rectus muscles were split bluntly in the midline and the peritoneum entered sharply without complication. This peritoneal incision was then extended superiorly and inferiorly with care given to prevent bowel or bladder injury. Upon entry into the abdominal cavity, the upper abdomen was inspected and found to be normal. Attention was then turned to the pelvis. A retractor was placed into the incision, and the bowel was packed away with moist laparotomy sponges. The left ovary was brought to the incision where a 5 cm cyst was noted. A small incision was made overlying the cyst and the cyst was carefully shelled out. The base of  the cyst was clamped with a Kelly clamp, transected and suture ligated.  Excellent hemostasis was noted.The remainder of the left ovary appeared normalAll laparotomy sponges and instruments were removed from the abdomen. The peritoneum was closed with 2-0 Vicryl, and the fascia was closed with 0 Vicryl in a running fashion. The subcutaneous layer was reapproximated with 2-0 plain gut. The skin was closed with a 4-0 Monocryl subcuticular stitch. Sponge, lap, needle, and instrument counts were correct times two. The patient was taken to the recovery area awake, extubated and in stable condition.

## 2015-01-07 NOTE — Transfer of Care (Signed)
Immediate Anesthesia Transfer of Care Note  Patient: Carla Henderson  Procedure(s) Performed: Procedure(s) with comments: Attempted LAPAROSCOPIC OVARIAN CYSTECTOMY, Mini laparotomy with excision of left ovarian cyst (Left) - Requested 01-26-15 @ 3:30p  Patient Location: PACU  Anesthesia Type:General  Level of Consciousness: awake  Airway & Oxygen Therapy: Patient Spontanous Breathing  Post-op Assessment: Report given to PACU RN  Post vital signs: stable  Filed Vitals:   01/07/15 0932  BP: 133/86  Pulse: 82  Temp: 36.8 C  Resp: 18    Complications: No apparent anesthesia complications

## 2015-01-07 NOTE — Anesthesia Preprocedure Evaluation (Addendum)
Anesthesia Evaluation  Patient identified by MRN, date of birth, ID band Patient awake    Reviewed: Allergy & Precautions, H&P , NPO status , Patient's Chart, lab work & pertinent test results  History of Anesthesia Complications Negative for: history of anesthetic complications  Airway Mallampati: I  TM Distance: >3 FB Neck ROM: full    Dental no notable dental hx.    Pulmonary neg pulmonary ROS,    Pulmonary exam normal breath sounds clear to auscultation       Cardiovascular negative cardio ROS Normal cardiovascular exam Rhythm:regular Rate:Normal     Neuro/Psych negative neurological ROS     GI/Hepatic negative GI ROS, Neg liver ROS,   Endo/Other  negative endocrine ROS  Renal/GU negative Renal ROS     Musculoskeletal   Abdominal (+) + obese,   Peds  Hematology  (+) anemia ,   Anesthesia Other Findings NPO appropriate, allergies reviewed Denies active cardiac or pulmonary symptoms, METS > 4 No recent congestive cough or symptoms of upper respiratory infection    Reproductive/Obstetrics negative OB ROS                            Anesthesia Physical Anesthesia Plan  ASA: II  Anesthesia Plan: General   Post-op Pain Management:    Induction: Intravenous  Airway Management Planned: Oral ETT  Additional Equipment:   Intra-op Plan:   Post-operative Plan: Extubation in OR  Informed Consent: I have reviewed the patients History and Physical, chart, labs and discussed the procedure including the risks, benefits and alternatives for the proposed anesthesia with the patient or authorized representative who has indicated his/her understanding and acceptance.     Plan Discussed with: Anesthesiologist, CRNA and Surgeon  Anesthesia Plan Comments: (GA with ETT, multi-therapy anti-emetic therapy, multimodal analgesia)        Anesthesia Quick Evaluation

## 2015-01-07 NOTE — Anesthesia Postprocedure Evaluation (Signed)
Anesthesia Post Note  Patient: Carla Henderson  Procedure(s) Performed: Procedure(s) (LRB): Attempted LAPAROSCOPIC OVARIAN CYSTECTOMY, Mini laparotomy with excision of left ovarian cyst (Left)  Anesthesia type: General  Patient location: PACU  Post pain: Pain level controlled  Post assessment: Post-op Vital signs reviewed  Last Vitals:  Filed Vitals:   01/07/15 1615  BP:   Pulse: 86  Temp:   Resp: 14    Post vital signs: Reviewed  Level of consciousness: sedated  Complications: No apparent anesthesia complications

## 2015-01-07 NOTE — Anesthesia Procedure Notes (Signed)
Procedure Name: Intubation Date/Time: 01/07/2015 1:09 PM Performed by: Riki Sheer Pre-anesthesia Checklist: Patient identified, Emergency Drugs available, Suction available, Patient being monitored and Timeout performed Patient Re-evaluated:Patient Re-evaluated prior to inductionOxygen Delivery Method: Circle system utilized Preoxygenation: Pre-oxygenation with 100% oxygen Intubation Type: IV induction Ventilation: Mask ventilation without difficulty Laryngoscope Size: Miller and 3 Grade View: Grade I Tube type: Oral Tube size: 7.0 mm Number of attempts: 1 Airway Equipment and Method: Stylet Placement Confirmation: ETT inserted through vocal cords under direct vision,  positive ETCO2,  CO2 detector and breath sounds checked- equal and bilateral Secured at: 22 cm Tube secured with: Tape Dental Injury: Teeth and Oropharynx as per pre-operative assessment

## 2015-01-08 ENCOUNTER — Encounter (HOSPITAL_COMMUNITY): Payer: Self-pay | Admitting: Obstetrics and Gynecology

## 2015-01-08 MED ORDER — IBUPROFEN 600 MG PO TABS: 600.0000 mg | ORAL_TABLET | Freq: Four times a day (QID) | ORAL | Status: DC | PRN

## 2015-01-08 MED ORDER — OXYCODONE-ACETAMINOPHEN 5-325 MG PO TABS: 1.0000 | ORAL_TABLET | ORAL | Status: DC | PRN

## 2015-01-08 NOTE — Discharge Summary (Signed)
Physician Discharge Summary  Patient ID: Carla Henderson MRN: [id number] DOB/AGE: 1990-12-02 24 y.o.  Admit date: 01/07/2015 Discharge date: 01/08/2015  Admission Diagnoses: Left complex ovarian cyst  Discharge Diagnoses: s/p ex-lap with left ovarian cystectomy Active Problems:   Cyst of left ovary   S/P laparotomy   Discharged Condition: good  Hospital Course: Patient admitted for scheduled surgical removal of a persistent left ovarian cyst (see op note). Patient did well overnight. Her pain was well controlled with ibuprofen and percocet. She tolerated a regular diet without nausea and emesis. She voided and passed flatus. She denies feeling dizzy or lightheaded. Discharge instructions were provided. Patient will follow up at [facility] in 2 weeks for post op check  Consults: None  Treatments: surgery: Ex-lap left ovarian cystectomy  Discharge Exam: Blood pressure 111/57, pulse 66, temperature 97.6 F (36.4 C), temperature source Oral, resp. rate 18, height 5' 9"  (1.753 m), weight 215 lb (97.523 kg), last menstrual period 11/25/2014, SpO2 100 %. General appearance: alert, cooperative and no distress Resp: clear to auscultation bilaterally Cardio: regular rate and rhythm GI: soft, appropriately tender, ND. + bowel sounds Extremities: Homans sign is negative, no sign of DVT and no edema, redness or tenderness in the calves or thighs Incision/Wound: umbilical and RUQ incision without erythema, induration or drainage. Honeycomb dressing, clean dry and intact  Disposition: 01-Home or Self Care     Medication List    STOP taking these medications        acetaminophen 500 MG tablet  Commonly known as:  TYLENOL      TAKE these medications        ibuprofen 600 MG tablet  Commonly known as:  ADVIL,MOTRIN  Take 1 tablet (600 mg total) by mouth every 6 (six) hours as needed (mild pain).     metFORMIN 500 MG tablet  Commonly known as:  GLUCOPHAGE  TAKE 1 TABLET (500 MG  TOTAL) BY MOUTH 2 (TWO) TIMES DAILY WITH A MEAL.     oxyCODONE-acetaminophen 5-325 MG per tablet  Commonly known as:  PERCOCET/ROXICET  Take 1-2 tablets by mouth every 4 (four) hours as needed (moderate to severe pain (when tolerating fluids)).     VITEX EXTRACT PO  Take 3 tablets by mouth daily.           Follow-up Information    Follow up with [facility].   Specialty:  Obstetrics and Gynecology   Why:  An appointment will be made for you to see me in 2 weeks for a post-op check   Contact information:   [place] [phone]      Signed: [place] 01/08/2015, 9:22 AM

## 2015-01-08 NOTE — Anesthesia Postprocedure Evaluation (Signed)
  Anesthesia Post-op Note  Patient: Carla Henderson  Procedure(s) Performed: Procedure(s) with comments: Attempted LAPAROSCOPIC OVARIAN CYSTECTOMY, Mini laparotomy with excision of left ovarian cyst (Left) - Requested 01-26-15 @ 3:30p  Patient Location: PACU and Women's Unit  Anesthesia Type:General  Level of Consciousness: awake, alert  and oriented  Airway and Oxygen Therapy: Patient Spontanous Breathing  Post-op Pain: mild  Post-op Assessment: Post-op Vital signs reviewed, Respiratory Function Stable, Patent Airway, No signs of Nausea or vomiting, Adequate PO intake, Pain level controlled, No headache, No backache and Patient able to bend at knees              Post-op Vital Signs: Reviewed and stable  Last Vitals:  Filed Vitals:   01/08/15 0610  BP: 111/57  Pulse: 66  Temp: 36.4 C  Resp: 18    Complications: No apparent anesthesia complications

## 2015-01-08 NOTE — Discharge Instructions (Signed)
Laparotomy Care After Refer to this sheet in the next few weeks. These instructions provide you with information on caring for yourself after your procedure. Your caregiver may also give you more specific instructions. Your treatment has been planned according to current medical practices, but problems sometimes occur. Call your caregiver if you have any problems or questions after your procedure. HOME CARE INSTRUCTIONS ACTIVITY  Rest as much as possible the first two weeks at home.  Avoid strenuous activity such as heavy lifting (more than 10 pounds), pushing, or pulling. Limit stair climbing to once or twice a day for the first week, then slowly increase this activity.  Take frequent rest periods throughout the day.  Talk with your caregiver about when you may resume your usual physical activity.  You need to be out of bed and walking as much as possible. This decreases the chance of:  Blood clots.  Pneumonia. NUTRITION  You can resume your normal diet once you regain bowel function.  Drink plenty of fluids (6-8 glasses a day or as instructed by your caregiver).  Eat a well-balanced diet.  Daily portions of food from the meat (protein), milk, vegetable, and bread groups are necessary for your health. ELIMINATION It is very important not to strain during bowel movements. If constipation should occur, you may:  Take a mild laxative.  Add fruit and bran to your diet.  Drink more fluids. HYGIENE  Take showers, not baths, until 4-6 weeks after surgery.  If your incision is closed, you may take a shower or tub bath. FEVER If you feel feverish or have shaking chills, take your temperature. If it is 102 F (38.9 C), call your caregiver. The fever may mean there is an infection. PAIN CONTROL  Mild discomfort may occur.  Only take over-the-counter or prescription medicines for pain, discomfort, or fever as directed by your caregiver. Take any prescribed medicines exactly as  directed. INCISION CARE  Keep your incision site clean with soap and water.  Do not use a dressing unless your cut (incision) from surgery is draining or irritated.  If you have small adhesive strips in place and they do not fall off within 10 days, carefully peel them off.  Check your incision and surrounding area daily for any redness, swelling, discoloration, heavy drainage, or separation of the skin. SEXUAL INTERCOURSE Do not have sexual intercourse until after your follow-up appointment, unless your caregiver tells you otherwise. SEEK MEDICAL CARE IF:   You are unable to tolerate food or drinks.  You are unable to pass gas or have a bowel movement.  Your pain becomes more severe or is not relieved with medicines.  You have redness, swelling, discoloration, heavy drainage, or separation of the skin at the incision site. Document Released: 11/23/2003 Document Revised: 03/27/2012 Document Reviewed: 04/09/2007 [facility] Patient Information 2015 [place], [place]. This information is not intended to replace advice given to you by your health care provider. Make sure you discuss any questions you have with your health care provider.   Contraception Choices Contraception (birth control) is the use of any methods or devices to prevent pregnancy. Below are some methods to help avoid pregnancy. HORMONAL METHODS   Contraceptive implant. This is a thin, plastic tube containing progesterone hormone. It does not contain estrogen hormone. Your health care provider inserts the tube in the inner part of the upper arm. The tube can remain in place for up to 3 years. After 3 years, the implant must be removed. The implant prevents  the ovaries from releasing an egg (ovulation), thickens the cervical mucus to prevent sperm from entering the uterus, and thins the lining of the inside of the uterus.  Progesterone-only injections. These injections are given every 3 months by your health care provider to  prevent pregnancy. This synthetic progesterone hormone stops the ovaries from releasing eggs. It also thickens cervical mucus and changes the uterine lining. This makes it harder for sperm to survive in the uterus.  Birth control pills. These pills contain estrogen and progesterone hormone. They work by preventing the ovaries from releasing eggs (ovulation). They also cause the cervical mucus to thicken, preventing the sperm from entering the uterus. Birth control pills are prescribed by a health care provider.Birth control pills can also be used to treat heavy periods.  Minipill. This type of birth control pill contains only the progesterone hormone. They are taken every day of each month and must be prescribed by your health care provider.  Birth control patch. The patch contains hormones similar to those in birth control pills. It must be changed once a week and is prescribed by a health care provider.  Vaginal ring. The ring contains hormones similar to those in birth control pills. It is left in the vagina for 3 weeks, removed for 1 week, and then a new one is put back in place. The patient must be comfortable inserting and removing the ring from the vagina.A health care provider's prescription is necessary.  Emergency contraception. Emergency contraceptives prevent pregnancy after unprotected sexual intercourse. This pill can be taken right after sex or up to 5 days after unprotected sex. It is most effective the sooner you take the pills after having sexual intercourse. Most emergency contraceptive pills are available without a prescription. Check with your pharmacist. Do not use emergency contraception as your only form of birth control. BARRIER METHODS   Female condom. This is a thin sheath (latex or rubber) that is worn over the penis during sexual intercourse. It can be used with spermicide to increase effectiveness.  Female condom. This is a soft, loose-fitting sheath that is put into the  vagina before sexual intercourse.  Diaphragm. This is a soft, latex, dome-shaped barrier that must be fitted by a health care provider. It is inserted into the vagina, along with a spermicidal jelly. It is inserted before intercourse. The diaphragm should be left in the vagina for 6 to 8 hours after intercourse.  Cervical cap. This is a round, soft, latex or plastic cup that fits over the cervix and must be fitted by a health care provider. The cap can be left in place for up to 48 hours after intercourse.  Sponge. This is a soft, circular piece of polyurethane foam. The sponge has spermicide in it. It is inserted into the vagina after wetting it and before sexual intercourse.  Spermicides. These are chemicals that kill or block sperm from entering the cervix and uterus. They come in the form of creams, jellies, suppositories, foam, or tablets. They do not require a prescription. They are inserted into the vagina with an applicator before having sexual intercourse. The process must be repeated every time you have sexual intercourse. INTRAUTERINE CONTRACEPTION  Intrauterine device (IUD). This is a T-shaped device that is put in a woman's uterus during a menstrual period to prevent pregnancy. There are 2 types:  Copper IUD. This type of IUD is wrapped in copper wire and is placed inside the uterus. Copper makes the uterus and fallopian tubes produce a  fluid that kills sperm. It can stay in place for 10 years.  Hormone IUD. This type of IUD contains the hormone progestin (synthetic progesterone). The hormone thickens the cervical mucus and prevents sperm from entering the uterus, and it also thins the uterine lining to prevent implantation of a fertilized egg. The hormone can weaken or kill the sperm that get into the uterus. It can stay in place for 3-5 years, depending on which type of IUD is used. PERMANENT METHODS OF CONTRACEPTION  Female tubal ligation. This is when the woman's fallopian tubes  are surgically sealed, tied, or blocked to prevent the egg from traveling to the uterus.  Hysteroscopic sterilization. This involves placing a small coil or insert into each fallopian tube. Your doctor uses a technique called hysteroscopy to do the procedure. The device causes scar tissue to form. This results in permanent blockage of the fallopian tubes, so the sperm cannot fertilize the egg. It takes about 3 months after the procedure for the tubes to become blocked. You must use another form of birth control for these 3 months.  Female sterilization. This is when the female has the tubes that carry sperm tied off (vasectomy).This blocks sperm from entering the vagina during sexual intercourse. After the procedure, the man can still ejaculate fluid (semen). NATURAL PLANNING METHODS  Natural family planning. This is not having sexual intercourse or using a barrier method (condom, diaphragm, cervical cap) on days the woman could become pregnant.  Calendar method. This is keeping track of the length of each menstrual cycle and identifying when you are fertile.  Ovulation method. This is avoiding sexual intercourse during ovulation.  Symptothermal method. This is avoiding sexual intercourse during ovulation, using a thermometer and ovulation symptoms.  Post-ovulation method. This is timing sexual intercourse after you have ovulated. Regardless of which type or method of contraception you choose, it is important that you use condoms to protect against the transmission of sexually transmitted infections (STIs). Talk with your health care provider about which form of contraception is most appropriate for you. Document Released: 04/10/2005 Document Revised: 04/15/2013 Document Reviewed: 10/03/2012 [facility] Patient Information 2015 [place], [place]. This information is not intended to replace advice given to you by your health care provider. Make sure you discuss any questions you have with your health care  provider.

## 2015-01-08 NOTE — Addendum Note (Signed)
Addendum  created 01/08/15 0947 by Lenox Ponds, CRNA   Modules edited: Notes Section   Notes Section:  File: [id number]

## 2015-01-12 ENCOUNTER — Inpatient Hospital Stay (EMERGENCY_DEPARTMENT_HOSPITAL)
Admission: AD | Admit: 2015-01-12 | Discharge: 2015-01-12 | Disposition: A | Discharge status: Home / Self Care | Payer: Self-pay | Source: Ambulatory Visit | Attending: Obstetrics & Gynecology | Admitting: Obstetrics & Gynecology

## 2015-01-12 ENCOUNTER — Encounter (HOSPITAL_COMMUNITY): Payer: Self-pay

## 2015-01-12 ENCOUNTER — Encounter (HOSPITAL_COMMUNITY): Payer: Self-pay | Admitting: *Deleted

## 2015-01-12 DIAGNOSIS — R0602 Shortness of breath: Secondary | ICD-10-CM | POA: Insufficient documentation

## 2015-01-12 DIAGNOSIS — Z8249 Family history of ischemic heart disease and other diseases of the circulatory system: Secondary | ICD-10-CM | POA: Insufficient documentation

## 2015-01-12 DIAGNOSIS — Z833 Family history of diabetes mellitus: Secondary | ICD-10-CM | POA: Insufficient documentation

## 2015-01-12 DIAGNOSIS — Z9889 Other specified postprocedural states: Secondary | ICD-10-CM | POA: Insufficient documentation

## 2015-01-12 LAB — CBC
HCT: 29.9 % — ABNORMAL LOW (ref 36.0–46.0)
Hemoglobin: 9.6 g/dL — ABNORMAL LOW (ref 12.0–15.0)
MCH: 26.8 pg (ref 26.0–34.0)
MCHC: 32.1 g/dL (ref 30.0–36.0)
MCV: 83.5 fL (ref 78.0–100.0)
PLATELETS: 293 10*3/uL (ref 150–400)
RBC: 3.58 MIL/uL — AB (ref 3.87–5.11)
RDW: 14.3 % (ref 11.5–15.5)
WBC: 5.6 10*3/uL (ref 4.0–10.5)

## 2015-01-12 LAB — D-DIMER, QUANTITATIVE (NOT AT ARMC): D DIMER QUANT: 1.47 ug{FEU}/mL — AB (ref 0.00–0.48)

## 2015-01-12 MED ORDER — GI COCKTAIL ~~LOC~~
30.0000 mL | Freq: Once | ORAL | Status: AC
  Administered 2015-01-12: 30 mL via ORAL
  Filled 2015-01-12: qty 30

## 2015-01-12 MED ORDER — HYDROXYZINE HCL 25 MG PO TABS
25.0000 mg | ORAL_TABLET | Freq: Once | ORAL | Status: AC
  Administered 2015-01-12: 25 mg via ORAL
  Filled 2015-01-12: qty 1

## 2015-01-12 MED ORDER — HYDROXYZINE HCL 25 MG PO TABS: 25.0000 mg | ORAL_TABLET | Freq: Four times a day (QID) | ORAL | Status: DC

## 2015-01-12 MED ORDER — KETOROLAC TROMETHAMINE 60 MG/2ML IM SOLN
60.0000 mg | Freq: Once | INTRAMUSCULAR | Status: AC
  Administered 2015-01-12: 60 mg via INTRAMUSCULAR
  Filled 2015-01-12: qty 2

## 2015-01-12 NOTE — MAU Note (Signed)
Urine in lab 

## 2015-01-12 NOTE — ED Notes (Addendum)
S/p lap 01-07-15 to remove ovarian cyst. Onset yesterday pt started feeling short of breath and felt like someone sitting on chest.  Pt went to [facility] today.  Was called tonight and advised to go to [facility] ED for CT to r/o PE.  Pt was given GI cocktail and Toradol IM, pt is not c/o of any pain or shortness of breath at this time.

## 2015-01-12 NOTE — MAU Note (Signed)
Feels like something is sitting on her chest, first started yesterday, has gotten worse.  This morning felt anxious and still felt the heaviness in chest.  Had ovarian cyst removed last Thursday, laparotomy.  When she eats, she feels like she can't catch her breath.

## 2015-01-12 NOTE — MAU Provider Note (Signed)
History     CSN: [id number]  Arrival date and time: 01/12/15 1435   First Provider Initiated Contact with Patient 01/12/15 1515      Chief Complaint  Patient presents with  . Shortness of Breath  . heaviness in chest    HPI  Pt is not pregnant post op mini laparotomy with excision of left ovarian cyst  By dr. Elly Modena on 01/07/2015.  Pt was discharge home on 9/16.  Pt states she had onset of SOB last night but was able to go to sleep in recliner after taking a pain pill (Percocet).  Pt is better when she is up and walking around.  Pt has not been able to pass gas.  Pt has hx of anxiety but does not take any medication for the anxiety.  Pt denies any calf pain, swelling or edema.  Pt has general abd tenderness with palpation-  Pt has had some nausea and decrease appetite especially when takes pain pills. RN note:   MAU Note 01/12/2015 2:58 PM    Expand All Collapse All   Feels like something is sitting on her chest, first started yesterday, has gotten worse. This morning felt anxious and still felt the heaviness in chest. Had ovarian cyst removed last Thursday, laparotomy. When she eats, she feels like she can't catch her breath.        Past Medical History  Diagnosis Date  . UTI (lower urinary tract infection)   . Abnormal Pap smear   . Anemia   . PCOS (polycystic ovarian syndrome)     Past Surgical History  Procedure Laterality Date  . Wisdom tooth extraction    . Laparoscopic ovarian cystectomy Left 01/07/2015    Procedure: Attempted LAPAROSCOPIC OVARIAN CYSTECTOMY, Mini laparotomy with excision of left ovarian cyst;  Surgeon: Mora Bellman, MD;  Location: [facility];  Service: Gynecology;  Laterality: Left;  Requested 01-26-15 @ 3:30p    Family History  Problem Relation Age of Onset  . Other Neg Hx   . Hypertension Paternal Grandmother   . Diabetes Paternal Grandfather     Social History  Substance Use Topics  . Smoking status: Never Smoker   . Smokeless  tobacco: Never Used  . Alcohol Use: No     Comment: occassional     Allergies:  Allergies  Allergen Reactions  . Vicodin [Hydrocodone-Acetaminophen] Nausea And Vomiting    Pt states does not cause swelling    Prescriptions prior to admission  Medication Sig Dispense Refill Last Dose  . Chaste Tree (VITEX EXTRACT PO) Take 3 tablets by mouth daily.   12/31/2014  . ibuprofen (ADVIL,MOTRIN) 600 MG tablet Take 1 tablet (600 mg total) by mouth every 6 (six) hours as needed (mild pain). 30 tablet 3   . metFORMIN (GLUCOPHAGE) 500 MG tablet TAKE 1 TABLET (500 MG TOTAL) BY MOUTH 2 (TWO) TIMES DAILY WITH A MEAL. 60 tablet 1 12/31/2014 at Unknown time  . oxyCODONE-acetaminophen (PERCOCET/ROXICET) 5-325 MG per tablet Take 1-2 tablets by mouth every 4 (four) hours as needed (moderate to severe pain (when tolerating fluids)). 30 tablet 0 01/12/2015 at 0700    Review of Systems  Constitutional: Negative for fever and chills.  Gastrointestinal: Positive for nausea, abdominal pain and constipation. Negative for vomiting and diarrhea.       Pt is passing gas but it is difficult  Genitourinary: Negative for dysuria, urgency and frequency.  Neurological: Negative for headaches.  Psychiatric/Behavioral: The patient is nervous/anxious.  Pt does not appear anxious but says she has been anxious   Physical Exam   Blood pressure 122/83, pulse 73, temperature 98.8 F (37.1 C), temperature source Oral, resp. rate 20, last menstrual period 12/24/2014, SpO2 100 %.  Physical Exam  Nursing note and vitals reviewed. Constitutional: She is oriented to person, place, and time. She appears well-developed and well-nourished. No distress.  HENT:  Head: Normocephalic.  Eyes: Pupils are equal, round, and reactive to light.  Neck: Normal range of motion. Neck supple.  Cardiovascular: Normal rate, regular rhythm and normal heart sounds.   Respiratory: Effort normal and breath sounds normal. No respiratory distress.  She has no wheezes. She has no rales. She exhibits no tenderness.  GI: Soft. Bowel sounds are normal. She exhibits no distension. There is no tenderness. There is no rebound and no guarding.  Diffusely tender with palpation No firmness or reddness noted Periumbilical incision and low transverse incision Clean and dry  Musculoskeletal: Normal range of motion. She exhibits no tenderness.  Neurological: She is alert and oriented to person, place, and time.  Skin: Skin is warm and dry.  Psychiatric: She has a normal mood and affect.    MAU Course  Procedures EKG - normal sinus rhythm GI cocktail Toradol 41m IM given and vistaril 212mtablet given; pt's pain diminished/  Discussed with Dr. HaIhor Dowesults for orders placed or performed during the hospital encounter of 01/12/15 (from the past 24 hour(s))  CBC     Status: Abnormal   Collection Time: 01/12/15  3:45 PM  Result Value Ref Range   WBC 5.6 4.0 - 10.5 K/uL   RBC 3.58 (L) 3.87 - 5.11 MIL/uL   Hemoglobin 9.6 (L) 12.0 - 15.0 g/dL   HCT 29.9 (L) 36.0 - 46.0 %   MCV 83.5 78.0 - 100.0 fL   MCH 26.8 26.0 - 34.0 pg   MCHC 32.1 30.0 - 36.0 g/dL   RDW 14.3 11.5 - 15.5 %   Platelets 293 150 - 400 K/uL  [facility]    Status: Abnormal   Collection Time: 01/12/15  3:45 PM  Result Value Ref Range   D-Dimer, Quant 1.47 (H) 0.00 - 0.48 ug/mL-FEU  D-Dimer results not available when pt was discharged home- since sx were relieved, pt was discharged After results reviewed- discussed with Dr. HaIhor Dowwill have pt return for CT to R/O PE Called pt by phone and LM on VM for pt to return call to MAU NP for lab result and will need CT Assessment and Plan  SOB post op Rx- increase walking; recommend stool softener and miralax to prevent constipatioin Try NSAID and Vistaril for pain - limit Percocet for breakthrough pain Return for CT- LM on VM Pt called back- discussed elevated D-Dimer and need [facility]  [facility] Ra[facility]yvonnia [facility]uskyill [facility] pt- [facility] LINEBERRY,SUSAN 01/12/2015, 3:52 PM

## 2015-01-13 ENCOUNTER — Emergency Department (HOSPITAL_COMMUNITY): Payer: Medicaid Other

## 2015-01-13 ENCOUNTER — Emergency Department (HOSPITAL_COMMUNITY)
Admission: EM | Admit: 2015-01-13 | Discharge: 2015-01-13 | Disposition: A | Discharge status: Home / Self Care | Payer: Medicaid Other | Attending: Emergency Medicine | Admitting: Emergency Medicine

## 2015-01-13 DIAGNOSIS — J40 Bronchitis, not specified as acute or chronic: Secondary | ICD-10-CM

## 2015-01-13 LAB — BASIC METABOLIC PANEL
ANION GAP: 8 (ref 5–15)
BUN: 12 mg/dL (ref 6–20)
CHLORIDE: 104 mmol/L (ref 101–111)
CO2: 25 mmol/L (ref 22–32)
Calcium: 8.5 mg/dL — ABNORMAL LOW (ref 8.9–10.3)
Creatinine, Ser: 0.82 mg/dL (ref 0.44–1.00)
GFR calc Af Amer: >60 mL/min (ref >60)
GLUCOSE: 93 mg/dL (ref 65–99)
POTASSIUM: 3.9 mmol/L (ref 3.5–5.1)
Sodium: 137 mmol/L (ref 135–145)

## 2015-01-13 LAB — CBC WITH DIFFERENTIAL/PLATELET
BASOS ABS: 0 10*3/uL (ref 0.0–0.1)
Basophils Relative: 0 %
EOS PCT: 1 %
Eosinophils Absolute: 0.1 10*3/uL (ref 0.0–0.7)
HEMATOCRIT: 29.9 % — AB (ref 36.0–46.0)
HEMOGLOBIN: 9.6 g/dL — AB (ref 12.0–15.0)
LYMPHS PCT: 45 %
Lymphs Abs: 2.5 10*3/uL (ref 0.7–4.0)
MCH: 27 pg (ref 26.0–34.0)
MCHC: 32.1 g/dL (ref 30.0–36.0)
MCV: 84 fL (ref 78.0–100.0)
Monocytes Absolute: 0.4 10*3/uL (ref 0.1–1.0)
Monocytes Relative: 8 %
NEUTROS ABS: 2.6 10*3/uL (ref 1.7–7.7)
NEUTROS PCT: 46 %
PLATELETS: 276 10*3/uL (ref 150–400)
RBC: 3.56 MIL/uL — AB (ref 3.87–5.11)
RDW: 14.1 % (ref 11.5–15.5)
WBC: 5.6 10*3/uL (ref 4.0–10.5)

## 2015-01-13 LAB — I-STAT TROPONIN, ED: Troponin i, poc: 0 ng/mL (ref 0.00–0.08)

## 2015-01-13 MED ORDER — AEROCHAMBER PLUS W/MASK MISC
Status: AC
  Filled 2015-01-13: qty 1

## 2015-01-13 MED ORDER — PREDNISONE 20 MG PO TABS: 40.0000 mg | ORAL_TABLET | Freq: Every day | ORAL | Status: DC

## 2015-01-13 MED ORDER — ALBUTEROL SULFATE HFA 108 (90 BASE) MCG/ACT IN AERS
2.0000 | INHALATION_SPRAY | Freq: Once | RESPIRATORY_TRACT | Status: AC
  Administered 2015-01-13: 2 via RESPIRATORY_TRACT
  Filled 2015-01-13: qty 6.7

## 2015-01-13 MED ORDER — IOHEXOL 350 MG/ML SOLN
100.0000 mL | Freq: Once | INTRAVENOUS | Status: DC | PRN
  Administered 2015-01-13: 100 mL via INTRAVENOUS
  Filled 2015-01-13: qty 100

## 2015-01-13 MED ORDER — AEROCHAMBER PLUS FLO-VU LARGE MISC
1.0000 | Freq: Once | Status: DC
  Filled 2015-01-13 (×2): qty 1

## 2015-01-13 NOTE — ED Provider Notes (Signed)
CSN: [id number]     Arrival date & time 01/12/15  2128 History   First MD Initiated Contact with Patient 01/13/15 0234     Chief Complaint  Patient presents with  . Shortness of Breath     (Consider location/radiation/quality/duration/timing/severity/associated sxs/prior Treatment) HPI Comments: 24 year old female with a history of anemia and polycystic ovarian syndrome presents to the emergency department for evaluation of shortness of breath. Patient initially presented to [facility] with complaints of shortness of breath. She describes a pressure-like central chest pain associated with her symptoms. Symptoms began yesterday. Patient states that she has no symptoms at this time after receiving a GI cocktail and IM Toradol. She had a d-dimer completed at [facility] which was positive. [facility] advised the patient to come to the emergency department for PE evaluation. Symptoms not associated with fever, syncope, nausea, vomiting, or extremity weakness. No personal history of DVT/PE in the past. Patient is s/p laparoscopic ovarian cyst removal on 01/07/15.  Patient is a 24 y.o. female presenting with shortness of breath. The history is provided by the patient. No language interpreter was used.  Shortness of Breath Associated symptoms: chest pain   Associated symptoms: no fever     Past Medical History  Diagnosis Date  . UTI (lower urinary tract infection)   . Abnormal Pap smear   . Anemia   . PCOS (polycystic ovarian syndrome)    Past Surgical History  Procedure Laterality Date  . Wisdom tooth extraction    . Laparoscopic ovarian cystectomy Left 01/07/2015    Procedure: Attempted LAPAROSCOPIC OVARIAN CYSTECTOMY, Mini laparotomy with excision of left ovarian cyst;  Surgeon: Mora Bellman, MD;  Location: [facility];  Service: Gynecology;  Laterality: Left;  Requested 01-26-15 @ 3:30p   Family History  Problem Relation Age of Onset  . Other Neg Hx   . Hypertension  Paternal Grandmother   . Diabetes Paternal Grandfather    Social History  Substance Use Topics  . Smoking status: Never Smoker   . Smokeless tobacco: Never Used  . Alcohol Use: No     Comment: occassional    OB History    Gravida Para Term Preterm AB TAB SAB Ectopic Multiple Living   1    1  1    0      Review of Systems  Constitutional: Negative for fever.  Respiratory: Positive for shortness of breath.   Cardiovascular: Positive for chest pain.  Neurological: Negative for syncope.  All other systems reviewed and are negative.   Allergies  Vicodin  Home Medications   Prior to Admission medications   Medication Sig Start Date End Date Taking? Authorizing Provider  ibuprofen (ADVIL,MOTRIN) 600 MG tablet Take 1 tablet (600 mg total) by mouth every 6 (six) hours as needed (mild pain). 01/08/15  Yes Peggy Constant, MD  metFORMIN (GLUCOPHAGE) 500 MG tablet TAKE 1 TABLET (500 MG TOTAL) BY MOUTH 2 (TWO) TIMES DAILY WITH A MEAL. 10/27/14  Yes Woodroe Mode, MD  oxyCODONE-acetaminophen (PERCOCET/ROXICET) 5-325 MG per tablet Take 1-2 tablets by mouth every 4 (four) hours as needed (moderate to severe pain (when tolerating fluids)). 01/08/15  Yes Peggy Constant, MD  hydrOXYzine (ATARAX/VISTARIL) 25 MG tablet Take 1 tablet (25 mg total) by mouth every 6 (six) hours. Patient not taking: Reported on 01/13/2015 01/12/15   West Pugh, NP  predniSONE (DELTASONE) 20 MG tablet Take 2 tablets (40 mg total) by mouth daily. 01/13/15   Antonietta Breach, PA-C   BP 125/81 mmHg  Pulse 69  Temp(Src) 98.4 F (36.9 C)  Resp 15  Ht 5' 9"  (1.753 m)  Wt 221 lb 14.4 oz (100.653 kg)  BMI 32.75 kg/m2  SpO2 100%  LMP 12/24/2014   Physical Exam  Constitutional: She is oriented to person, place, and time. She appears well-developed and well-nourished. No distress.  Nontoxic/nonseptic appearing.  HENT:  Head: Normocephalic and atraumatic.  Eyes: Conjunctivae and EOM are normal. No scleral icterus.  Neck:  Normal range of motion.  Cardiovascular: Normal rate, regular rhythm and intact distal pulses.   Pulmonary/Chest: Effort normal and breath sounds normal. No respiratory distress. She has no wheezes. She has no rales.  Respirations even and unlabored. Lungs clear to auscultation bilaterally.  Musculoskeletal: Normal range of motion.  Neurological: She is alert and oriented to person, place, and time. She exhibits normal muscle tone. Coordination normal.  Skin: Skin is warm and dry. No rash noted. She is not diaphoretic. No erythema. No pallor.  Psychiatric: She has a normal mood and affect. Her behavior is normal.  Nursing note and vitals reviewed.   ED Course  Procedures (including critical care time) Labs Review Labs Reviewed  CBC WITH DIFFERENTIAL/PLATELET - Abnormal; Notable for the following:    RBC 3.56 (*)    Hemoglobin 9.6 (*)    HCT 29.9 (*)    All other components within normal limits  BASIC METABOLIC PANEL - Abnormal; Notable for the following:    Calcium 8.5 (*)    All other components within normal limits  I-STAT TROPOININ, ED    Imaging Review Ct Angio Chest Pe W/cm &/or Wo Cm  01/13/2015   CLINICAL DATA:  Shortness of breath and positive D-dimer  EXAM: CT ANGIOGRAPHY CHEST WITH CONTRAST  TECHNIQUE: Multidetector CT imaging of the chest was performed using the standard protocol during bolus administration of intravenous contrast. Multiplanar CT image reconstructions and MIPs were obtained to evaluate the vascular anatomy.  CONTRAST:  118m OMNIPAQUE IOHEXOL 350 MG/ML SOLN  COMPARISON:  None.  FINDINGS: THORACIC INLET/BODY WALL:  No acute abnormality.  MEDIASTINUM:  Normal heart size. No pericardial effusion. Occasional motion, but overall diagnostic exam and negative for pulmonary embolism. Negative aorta. No adenopathy.  LUNG WINDOWS:  Mild bronchial wall thickening seen diffusely with mucoid impaction noted in the right lower lobe posterior basilar segment. No pneumonia,  collapse, edema, or air leak.  UPPER ABDOMEN:  Negative.  OSSEOUS:  Negative.  Review of the MIP images confirms the above findings.  IMPRESSION: 1. Negative for pulmonary embolism. 2. Bronchitis.   Electronically Signed   By: JMonte FantasiaM.D.   On: 01/13/2015 04:55     I have personally reviewed and evaluated these images and lab results as part of my medical decision-making.   EKG Interpretation   Date/Time:  Wednesday January 13 2015 03:04:41 EDT Ventricular Rate:  66 PR Interval:  134 QRS Duration: 83 QT Interval:  371 QTC Calculation: 389 R Axis:   3 Text Interpretation:  Sinus rhythm Confirmed by WARD,  DO, KRISTEN (([id number]  on 01/13/2015 3:20:32 AM      MDM   Final diagnoses:  Bronchitis    24year old female presents to the emergency department for evaluation of shortness of breath. [facility] Patient asymptomatic in the emergency department since arrival [id number] Patient hemodynamically stable over ED course. Laboratory workup is noncontributory and EKG is nonischemic. Chest CT today shows no pulmonary embolus. There is evidence of  bronchitis.  Will treat patient symptomatically with prednisone course and albuterol inhaler. Primary care follow up advised and return precautions given. Patient agreeable to plan with no unaddressed concerns. Patient discharged in good condition.   Filed Vitals:   01/12/15 2154 01/13/15 0315 01/13/15 0330  BP: 135/77 132/77 125/81  Pulse: 88 71 69  Temp: 98.4 F (36.9 C)    Resp: 16 17 15   Height: 5' 9"  (1.753 m)    Weight: 221 lb 14.4 oz (100.653 kg)    SpO2: 98% 100% 100%       Antonietta Breach, PA-C 01/13/15 [place], DO 01/13/15 0600

## 2015-01-13 NOTE — Progress Notes (Signed)
Dr. Elly Modena notified.

## 2015-01-13 NOTE — Progress Notes (Signed)
Received call from Dr. Donato Heinz, who was wanting to speak with Dr. Elly Modena, and was informed that pt had her left ovary removed and he is sending the specimen to determine between benign atypical tumor or proliferating tumor.  He said that it may take 3-5 days to get results and to contact him @ [phone] if she has any questions.  I informed provider that I would communicate information to Dr. Elly Modena.

## 2015-01-13 NOTE — Discharge Instructions (Signed)

## 2015-01-14 ENCOUNTER — Telehealth: Payer: Self-pay | Admitting: *Deleted

## 2015-01-14 NOTE — Telephone Encounter (Signed)
Carla Henderson called and left a voice message this morning on nurse line stating she had a laparoscopic surgery - removal of ovarian cyst. States this morning  Her belly button was leaking a clear , brownish discharge and wants to know if this is normal or should she come in. States she does not have pain or heat in the area.

## 2015-01-15 NOTE — Telephone Encounter (Signed)
Called [place] and we discussed her complaints . She states had clear to brownish discharge yesterday. Denies any discharge since then. Denies any fever, redness at incision, tenderness at incision, or incision opening at all.  We discuss serous discharge normal as wound heals and maybe old blood. Advised if she has symptoms we listed to call office to be seen or if weekend go to MAU. Reviewed her postop appointment date/time with her. She voices understanding.

## 2015-01-27 ENCOUNTER — Encounter (HOSPITAL_COMMUNITY): Payer: Self-pay

## 2015-01-29 ENCOUNTER — Encounter: Payer: Self-pay | Admitting: Obstetrics and Gynecology

## 2015-01-29 ENCOUNTER — Ambulatory Visit (INDEPENDENT_AMBULATORY_CARE_PROVIDER_SITE_OTHER): Payer: Self-pay | Admitting: Obstetrics and Gynecology

## 2015-01-29 VITALS — BP 122/81 | HR 66 | Temp 98.4°F | Ht 69.0 in | Wt 217.8 lb

## 2015-01-29 DIAGNOSIS — Z9889 Other specified postprocedural states: Secondary | ICD-10-CM

## 2015-01-29 NOTE — Progress Notes (Signed)
Patient ID: Carla Henderson, female   DOB: 1990/11/25, 24 y.o.   MRN: [id number] 24 yo G1P0010 presenting today for post op check. Patient underwent a laparotomy with a left cystectomy on 01/07/2015. Patient reports feeling well since her surgery. She is no longer relying on pain medication for discomfort.   Past Medical History  Diagnosis Date  . UTI (lower urinary tract infection)   . Abnormal Pap smear   . Anemia   . PCOS (polycystic ovarian syndrome)    Past Surgical History  Procedure Laterality Date  . Wisdom tooth extraction    . Laparoscopic ovarian cystectomy Left 01/07/2015    Procedure: Attempted LAPAROSCOPIC OVARIAN CYSTECTOMY, Mini laparotomy with excision of left ovarian cyst;  Surgeon: Mora Bellman, MD;  Location: [facility];  Service: Gynecology;  Laterality: Left;  Requested 01-26-15 @ 3:30p   Family History  Problem Relation Age of Onset  . Other Neg Hx   . Hypertension Paternal Grandmother   . Diabetes Paternal Grandfather    Social History  Substance Use Topics  . Smoking status: Never Smoker   . Smokeless tobacco: Never Used  . Alcohol Use: No     Comment: occassional    ROS See pertinent in HPI  GENERAL: Well-developed, well-nourished female in no acute distress.  ABDOMEN: Soft, nontender, nondistended. No organomegaly. Incisions: healing well. No erythema, induration or drainage EXTREMITIES: No cyanosis, clubbing, or edema, 2+ distal pulses.     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 Patient is not interested in them at this time - RTC prn

## 2015-06-25 ENCOUNTER — Inpatient Hospital Stay (HOSPITAL_COMMUNITY)
Admission: AD | Admit: 2015-06-25 | Discharge: 2015-06-25 | Disposition: A | Discharge status: Home / Self Care | Payer: BLUE CROSS/BLUE SHIELD | Source: Ambulatory Visit | Attending: Obstetrics and Gynecology | Admitting: Obstetrics and Gynecology

## 2015-06-25 ENCOUNTER — Encounter (HOSPITAL_COMMUNITY): Payer: Self-pay | Admitting: *Deleted

## 2015-06-25 ENCOUNTER — Inpatient Hospital Stay (HOSPITAL_COMMUNITY): Payer: BLUE CROSS/BLUE SHIELD

## 2015-06-25 DIAGNOSIS — N83209 Unspecified ovarian cyst, unspecified side: Secondary | ICD-10-CM | POA: Diagnosis not present

## 2015-06-25 DIAGNOSIS — E282 Polycystic ovarian syndrome: Secondary | ICD-10-CM | POA: Diagnosis not present

## 2015-06-25 DIAGNOSIS — Z3A01 Less than 8 weeks gestation of pregnancy: Secondary | ICD-10-CM | POA: Insufficient documentation

## 2015-06-25 DIAGNOSIS — R102 Pelvic and perineal pain: Secondary | ICD-10-CM | POA: Diagnosis not present

## 2015-06-25 DIAGNOSIS — Z3201 Encounter for pregnancy test, result positive: Secondary | ICD-10-CM

## 2015-06-25 DIAGNOSIS — O26899 Other specified pregnancy related conditions, unspecified trimester: Secondary | ICD-10-CM

## 2015-06-25 DIAGNOSIS — R109 Unspecified abdominal pain: Secondary | ICD-10-CM | POA: Diagnosis present

## 2015-06-25 DIAGNOSIS — O26891 Other specified pregnancy related conditions, first trimester: Secondary | ICD-10-CM | POA: Insufficient documentation

## 2015-06-25 HISTORY — DX: Female infertility, unspecified: N97.9

## 2015-06-25 HISTORY — DX: Unspecified ovarian cyst, unspecified side: N83.209

## 2015-06-25 LAB — URINALYSIS, ROUTINE W REFLEX MICROSCOPIC
Bilirubin Urine: NEGATIVE
Glucose, UA: NEGATIVE mg/dL
KETONES UR: NEGATIVE mg/dL
Leukocytes, UA: NEGATIVE
NITRITE: NEGATIVE
Protein, ur: NEGATIVE mg/dL
Specific Gravity, Urine: 1.02 (ref 1.005–1.030)
pH: 7 (ref 5.0–8.0)

## 2015-06-25 LAB — POCT PREGNANCY, URINE: PREG TEST UR: POSITIVE — AB

## 2015-06-25 LAB — HCG, QUANTITATIVE, PREGNANCY: hCG, Beta Chain, Quant, S: 206 m[IU]/mL — ABNORMAL HIGH (ref <5)

## 2015-06-25 LAB — URINE MICROSCOPIC-ADD ON

## 2015-06-25 LAB — CBC
HEMATOCRIT: 33.4 % — AB (ref 36.0–46.0)
HEMOGLOBIN: 11.2 g/dL — AB (ref 12.0–15.0)
MCH: 28 pg (ref 26.0–34.0)
MCHC: 33.5 g/dL (ref 30.0–36.0)
MCV: 83.5 fL (ref 78.0–100.0)
Platelets: 267 10*3/uL (ref 150–400)
RBC: 4 MIL/uL (ref 3.87–5.11)
RDW: 15 % (ref 11.5–15.5)
WBC: 6 10*3/uL (ref 4.0–10.5)

## 2015-06-25 NOTE — MAU Note (Addendum)
C/o cramping lower abdominal pain for past 3 days; denies any vaginal bleeding; UPT was positive;

## 2015-06-25 NOTE — Discharge Instructions (Signed)
Pregnancy Test Information WHAT IS A PREGNANCY TEST? A pregnancy test is used to detect the presence of human chorionic gonadotropin (hCG) in a sample of your urine or blood. hCG is a hormone produced by the cells of the placenta. The placenta is the organ that forms to nourish and support a developing baby. This test requires a sample of either blood or urine. A pregnancy test determines whether you are pregnant or not. HOW ARE PREGNANCY TESTS DONE? Pregnancy tests are done using a home pregnancy test or having a blood or urine test done at your health care provider's office.  Home pregnancy tests require a urine sample.  Most kits use a plastic testing device with a strip of paper that indicates whether there is hCG in your urine.  Follow the test instructions very carefully.  After you urinate on the test stick, markings will appear to let you know whether you are pregnant.  For best results, use your first urine of the morning. That is when the concentration of hCG is highest. Having a blood test to check for pregnancy requires a sample of blood drawn from a vein in your hand or arm. Your health care provider will send your sample to a lab for testing. Results of a pregnancy test will be positive or negative. IS ONE TYPE OF PREGNANCY TEST BETTER THAN ANOTHER? In some cases, a blood test will return a positive result even if a urine test was negative because blood tests are more sensitive. This means blood tests can detect hCG earlier than home pregnancy tests.  HOW ACCURATE ARE HOME PREGNANCY TESTS?  Both types of pregnancy tests are very accurate.  A blood test is about 98% accurate.  When you are far enough along in your pregnancy and when used correctly, home pregnancy tests are equally accurate. CAN ANYTHING INTERFERE WITH HOME PREGNANCY TEST RESULTS?  It is possible for certain conditions to cause an inaccurate test result (false positive or falsenegative).  A false positive is a  positive test result when you are not pregnant. This can happen if you:  Are taking certain medicines, including anticonvulsants or tranquilizers.  Have certain proteins in your blood.  A false negative is a negative test result when you are pregnant. This can happen if you:  Took the test before there was enough hCG to detect. A pregnancy test will not be positive in most women until 3-4 weeks after conception.  Drank a lot of liquid before the test. Diluted urine samples can sometimes give an inaccurate result.  Take certain medicines, such as water pills (diuretics) or some antihistamines. WHAT SHOULD I DO IF I HAVE A POSITIVE PREGNANCY TEST? If you have a positive pregnancy test, schedule an appointment with your health care provider. You might need additional testing to confirm the pregnancy. In the meantime, begin taking a prenatal vitamin, stop smoking, stop drinking alcohol, and do not use street drugs. Talk to your health care provider about how to take care of yourself during your pregnancy. Ask about what to expect from the care you will need throughout pregnancy (prenatal care).   This information is not intended to replace advice given to you by your health care provider. Make sure you discuss any questions you have with your health care provider.   Document Released: 04/13/2003 Document Revised: 05/01/2014 Document Reviewed: 08/05/2013 Elsevier Interactive Patient Education [employer].

## 2015-06-25 NOTE — MAU Provider Note (Signed)
History    Carla Henderson, Carla Henderson is a 25yo, G2P0010, at 5 wks by LMP of 05/21/2015 presents to MAU unannounced for postive home pregnancy test and lower abdominal pain that started two days ago.  Pt describes abdominal pain  similar to " menstrual cramps" and is centered in the mid lower pelvis. Denies bleeding, dysuria, or vaginal discharge.   Reports history of PCOS, removal of ovarian cyst on year ago and recent use of Letrozol for fertility in Dec 2016.   Patient Active Problem List   Diagnosis Date Noted  . S/P laparotomy 01/07/2015  . Cyst of left ovary   . Complex ovarian cyst 12/03/2014  . Irregular menstrual bleeding 08/08/2013    Chief Complaint  Patient presents with  . Abdominal Pain   HPI  OB History    Gravida Para Term Preterm AB TAB SAB Ectopic Multiple Living   2    1  1    0      Past Medical History  Diagnosis Date  . UTI (lower urinary tract infection)   . Abnormal Pap smear   . Anemia   . PCOS (polycystic ovarian syndrome)   . Bronchitis   . Ovarian cyst   . Infertility, female     Past Surgical History  Procedure Laterality Date  . Wisdom tooth extraction    . Laparoscopic ovarian cystectomy Left 01/07/2015    Procedure: Attempted LAPAROSCOPIC OVARIAN CYSTECTOMY, Mini laparotomy with excision of left ovarian cyst;  Surgeon: Mora Bellman, MD;  Location: [facility];  Service: Gynecology;  Laterality: Left;  Requested 01-26-15 @ 3:30p    Family History  Problem Relation Age of Onset  . Other Neg Hx   . Hypertension Paternal Grandmother   . Diabetes Paternal Grandfather     Social History  Substance Use Topics  . Smoking status: Never Smoker   . Smokeless tobacco: Never Used  . Alcohol Use: No     Comment: occassional     Allergies:  Allergies  Allergen Reactions  . Vicodin [Hydrocodone-Acetaminophen] Nausea And Vomiting    Pt states does not cause swelling    Prescriptions prior to admission  Medication Sig Dispense Refill Last Dose  .  Chaste Tree (VITEX EXTRACT PO) Take 3 tablets by mouth daily.   Past Week at Unknown time  . Docosahexaenoic Acid (DHA PO) Take 1 tablet by mouth daily.   06/25/2015 at Unknown time  . Prenatal Vit-Fe Fumarate-FA (PRENATAL MULTIVITAMIN) TABS tablet Take 1 tablet by mouth daily at 12 noon.   06/25/2015 at Unknown time  . ibuprofen (ADVIL,MOTRIN) 600 MG tablet Take 1 tablet (600 mg total) by mouth every 6 (six) hours as needed (mild pain). (Patient not taking: Reported on 06/25/2015) 30 tablet 3 Taking  . metFORMIN (GLUCOPHAGE) 500 MG tablet TAKE 1 TABLET (500 MG TOTAL) BY MOUTH 2 (TWO) TIMES DAILY WITH A MEAL. (Patient not taking: Reported on 06/25/2015) 60 tablet 1 Taking  . oxyCODONE-acetaminophen (PERCOCET/ROXICET) 5-325 MG per tablet Take 1-2 tablets by mouth every 4 (four) hours as needed (moderate to severe pain (when tolerating fluids)). (Patient not taking: Reported on 06/25/2015) 30 tablet 0 Taking    ROS Physical Exam   Blood pressure 124/84, pulse 99, temperature 98.7 F (37.1 C), temperature source Oral, resp. rate 18, height 5' 9"  (1.753 m), weight 97.977 kg (216 lb), last menstrual period 05/21/2015.  Filed Vitals:   06/25/15 1522 06/25/15 1609  BP: 146/92 124/84  Pulse: 124 99  Temp: 98.7 F (37.1 C)  TempSrc: Oral   Resp: 18 18  Height: 5' 9"  (1.753 m)   Weight: 97.977 kg (216 lb)    Results for orders placed or performed during the hospital encounter of 06/25/15 (from the past 24 hour(s))  Urinalysis, Routine w reflex microscopic (not at [facility])     Status: Abnormal   Collection Time: 06/25/15  3:20 PM  Result Value Ref Range   Color, Urine YELLOW YELLOW   APPearance CLEAR CLEAR   Specific Gravity, Urine 1.020 1.005 - 1.030   pH 7.0 5.0 - 8.0   Glucose, UA NEGATIVE NEGATIVE mg/dL   Hgb urine dipstick SMALL (A) NEGATIVE   Bilirubin Urine NEGATIVE NEGATIVE   Ketones, ur NEGATIVE NEGATIVE mg/dL   Protein, ur NEGATIVE NEGATIVE mg/dL   Nitrite NEGATIVE NEGATIVE   Leukocytes, UA  NEGATIVE NEGATIVE  Urine microscopic-add on     Status: Abnormal   Collection Time: 06/25/15  3:20 PM  Result Value Ref Range   Squamous Epithelial / LPF 6-30 (A) NONE SEEN   WBC, UA 0-5 0 - 5 WBC/hpf   RBC / HPF 0-5 0 - 5 RBC/hpf   Bacteria, UA FEW (A) NONE SEEN  Pregnancy, urine POC     Status: Abnormal   Collection Time: 06/25/15  3:30 PM  Result Value Ref Range   Preg Test, Ur POSITIVE (A) NEGATIVE  CBC     Status: Abnormal   Collection Time: 06/25/15  4:13 PM  Result Value Ref Range   WBC 6.0 4.0 - 10.5 K/uL   RBC 4.00 3.87 - 5.11 MIL/uL   Hemoglobin 11.2 (L) 12.0 - 15.0 g/dL   HCT 33.4 (L) 36.0 - 46.0 %   MCV 83.5 78.0 - 100.0 fL   MCH 28.0 26.0 - 34.0 pg   MCHC 33.5 30.0 - 36.0 g/dL   RDW 15.0 11.5 - 15.5 %   Platelets 267 150 - 400 K/uL  hCG, quantitative, pregnancy     Status: Abnormal   Collection Time: 06/25/15  4:13 PM  Result Value Ref Range   hCG, Beta Chain, Quant, S 206 (H) <5 mIU/mL     Physical Exam  Constitutional: She is oriented to person, place, and time. She appears well-developed and well-nourished.  HENT:  Head: Normocephalic.  Eyes: Pupils are equal, round, and reactive to light.  Neck: Normal range of motion.  Cardiovascular: Normal rate and regular rhythm.   Respiratory: Effort normal.  GI: Soft.  Musculoskeletal: Normal range of motion.  Neurological: She is alert and oriented to person, place, and time. She has normal reflexes.  Skin: Skin is dry.  Psychiatric: She has a normal mood and affect. Her behavior is normal. Judgment and thought content normal.   [place]:   No evidence for an intrauterine pregnancy. Based on history of positive pregnancy test, this is compatible with a pregnancy of unknown location. Differential diagnosis includes intrauterine pregnancy too early to visualize, recent spontaneous abortion or occult ectopic pregnancy.   ED Course  Assessment: +UPT, 5wks by LMP of 05/21/2015 Pelvic Pain HCG=206 [place] indicates no  evidence of intrauterine pregnancy -MAY be too early to visualize, recent spontaneous abortion or occult ectopic pregnancy    Plan: DC Home Follow up HcG Quant on Sunday  06/27/15 after 5pm If HCG levels rise appropriately, Follow up  At [place] for HCG quant and [place] on 07/06/15 If HCG levels do not rise appropriately, follow closely for potential ectopic pregnancy Ectopic precautions provided  Call PRN if symptoms worsen  Lavetta Nielsen CNM, MSN 06/25/2015 5:41 PM

## 2015-06-25 NOTE — MAU Note (Signed)
Onset of lower abdominal pain two days ago, had surgery last year for cyst removal, positive home pregnancy test, LMP 05/21/15

## 2015-06-26 LAB — PROGESTERONE: Progesterone: 14.8 ng/mL

## 2015-07-01 ENCOUNTER — Inpatient Hospital Stay (HOSPITAL_COMMUNITY): Payer: BLUE CROSS/BLUE SHIELD

## 2015-07-01 ENCOUNTER — Encounter (HOSPITAL_COMMUNITY): Payer: Self-pay

## 2015-07-01 ENCOUNTER — Inpatient Hospital Stay (HOSPITAL_COMMUNITY)
Admission: AD | Admit: 2015-07-01 | Discharge: 2015-07-01 | Disposition: A | Discharge status: Home / Self Care | Payer: BLUE CROSS/BLUE SHIELD | Source: Ambulatory Visit | Attending: Obstetrics & Gynecology | Admitting: Obstetrics & Gynecology

## 2015-07-01 DIAGNOSIS — O26891 Other specified pregnancy related conditions, first trimester: Secondary | ICD-10-CM | POA: Insufficient documentation

## 2015-07-01 DIAGNOSIS — O26899 Other specified pregnancy related conditions, unspecified trimester: Secondary | ICD-10-CM

## 2015-07-01 DIAGNOSIS — Z3A01 Less than 8 weeks gestation of pregnancy: Secondary | ICD-10-CM | POA: Diagnosis not present

## 2015-07-01 DIAGNOSIS — R109 Unspecified abdominal pain: Secondary | ICD-10-CM

## 2015-07-01 DIAGNOSIS — O9989 Other specified diseases and conditions complicating pregnancy, childbirth and the puerperium: Secondary | ICD-10-CM

## 2015-07-01 DIAGNOSIS — O0281 Inappropriate change in quantitative human chorionic gonadotropin (hCG) in early pregnancy: Secondary | ICD-10-CM

## 2015-07-01 DIAGNOSIS — R1030 Lower abdominal pain, unspecified: Secondary | ICD-10-CM | POA: Diagnosis not present

## 2015-07-01 DIAGNOSIS — O3680X0 Pregnancy with inconclusive fetal viability, not applicable or unspecified: Secondary | ICD-10-CM

## 2015-07-01 LAB — CBC
HEMATOCRIT: 33.5 % — AB (ref 36.0–46.0)
HEMOGLOBIN: 11.2 g/dL — AB (ref 12.0–15.0)
MCH: 28.1 pg (ref 26.0–34.0)
MCHC: 33.4 g/dL (ref 30.0–36.0)
MCV: 84 fL (ref 78.0–100.0)
Platelets: 278 10*3/uL (ref 150–400)
RBC: 3.99 MIL/uL (ref 3.87–5.11)
RDW: 15.4 % (ref 11.5–15.5)
WBC: 6.8 10*3/uL (ref 4.0–10.5)

## 2015-07-01 LAB — WET PREP, GENITAL
CLUE CELLS WET PREP: NONE SEEN
SPERM: NONE SEEN
TRICH WET PREP: NONE SEEN
Yeast Wet Prep HPF POC: NONE SEEN

## 2015-07-01 LAB — URINALYSIS, ROUTINE W REFLEX MICROSCOPIC
BILIRUBIN URINE: NEGATIVE
Glucose, UA: NEGATIVE mg/dL
HGB URINE DIPSTICK: NEGATIVE
Ketones, ur: NEGATIVE mg/dL
LEUKOCYTES UA: NEGATIVE
Nitrite: NEGATIVE
PH: 6 (ref 5.0–8.0)
Protein, ur: NEGATIVE mg/dL
Specific Gravity, Urine: 1.02 (ref 1.005–1.030)

## 2015-07-01 LAB — HCG, QUANTITATIVE, PREGNANCY: HCG, BETA CHAIN, QUANT, S: 2480 m[IU]/mL — AB (ref <5)

## 2015-07-01 NOTE — MAU Note (Signed)
Pt reports she stared having groin pain when she was laying down last night. Pain is getting worse. Took 2 ibuprofen with some relief.

## 2015-07-01 NOTE — Discharge Instructions (Signed)

## 2015-07-01 NOTE — MAU Provider Note (Signed)
History     CSN: [id number]  Arrival date and time: 07/01/15 1450   None     Chief Complaint  Patient presents with  . Groin Pain   HPI   Carla Henderson is a 25 y.o. female G2P0010 at 27w6dpresenting with abdominal pain. She was seen on 3/3 here in MAU for pain; she found out at that time that she was pregnant. The pain worsened early this morning around 0500; she got up for work and took ibuprofen. She went to work and around 0900 the pain came back.   The pain currently is located in her lower abdomen; on both sides. The sharp pain comes and goes and the achy pain is constant.   Last dose of ibuprofen was 0700; she currently rates her pain 8/10. The patient is less when she is sitting and resting.   OB History    Gravida Para Term Preterm AB TAB SAB Ectopic Multiple Living   2    1  1    0      Past Medical History  Diagnosis Date  . UTI (lower urinary tract infection)   . Abnormal Pap smear   . Anemia   . PCOS (polycystic ovarian syndrome)   . Bronchitis   . Ovarian cyst   . Infertility, female     Past Surgical History  Procedure Laterality Date  . Wisdom tooth extraction    . Laparoscopic ovarian cystectomy Left 01/07/2015    Procedure: Attempted LAPAROSCOPIC OVARIAN CYSTECTOMY, Mini laparotomy with excision of left ovarian cyst;  Surgeon: PMora Bellman MD;  [facility];  Service: Gynecology;  Laterality: Left;  Requested 01-26-15 @ 3:30p    Family History  Problem Relation Age of Onset  . Other Neg Hx   . Hypertension Paternal Grandmother   . Diabetes Paternal Grandfather     Social History  Substance Use Topics  . Smoking status: Never Smoker   . Smokeless tobacco: Never Used  . Alcohol Use: No     Comment: occassional     Allergies:  Allergies  Allergen Reactions  . Vicodin [Hydrocodone-Acetaminophen] Nausea And Vomiting    Pt states does not cause swelling    Prescriptions prior to admission  Medication Sig Dispense Refill Last Dose   . Docosahexaenoic Acid (DHA PO) Take 1 tablet by mouth daily.   07/01/2015 at Unknown time  . Prenatal Vit-Fe Fumarate-FA (PRENATAL MULTIVITAMIN) TABS tablet Take 1 tablet by mouth daily at 12 noon.   07/01/2015 at Unknown time  . metFORMIN (GLUCOPHAGE) 500 MG tablet TAKE 1 TABLET (500 MG TOTAL) BY MOUTH 2 (TWO) TIMES DAILY WITH A MEAL. (Patient not taking: Reported on 06/25/2015) 60 tablet 1 Taking   Results for orders placed or performed during the hospital encounter of 07/01/15 (from the past 24 hour(s))  Urinalysis, [facility] w reflex microscopic [facility]     Status: None   Collection Time: 07/01/15  3:15 PM  Result Value Ref Range   Color, Urine YELLOW YELLOW   APPearance CLEAR CLEAR   Specific Gravity, Urine 1.020 1.005 - 1.030   pH 6.0 5.0 - 8.0   Glucose, UA NEGATIVE NEGATIVE mg/dL   Hgb urine dipstick NEGATIVE NEGATIVE   Bilirubin Urine NEGATIVE NEGATIVE   Ketones, ur NEGATIVE NEGATIVE mg/dL   Protein, ur NEGATIVE NEGATIVE mg/dL   Nitrite NEGATIVE NEGATIVE   Leukocytes, UA NEGATIVE NEGATIVE  hCG, quantitative, pregnancy     Status: Abnormal   Collection Time: 07/01/15  4:12 PM  Result Value Ref Range   hCG, Beta Chain, Quant, S 2480 (H) <5 mIU/mL  CBC     Status: Abnormal   Collection Time: 07/01/15  4:12 PM  Result Value Ref Range   WBC 6.8 4.0 - 10.5 K/uL   RBC 3.99 3.87 - 5.11 MIL/uL   Hemoglobin 11.2 (L) 12.0 - 15.0 g/dL   HCT 33.5 (L) 36.0 - 46.0 %   MCV 84.0 78.0 - 100.0 fL   MCH 28.1 26.0 - 34.0 pg   MCHC 33.4 30.0 - 36.0 g/dL   RDW 15.4 11.5 - 15.5 %   Platelets 278 150 - 400 K/uL  Wet prep, genital     Status: Abnormal   Collection Time: 07/01/15  5:50 PM  Result Value Ref Range   Yeast Wet Prep HPF POC NONE SEEN NONE SEEN   Trich, Wet Prep NONE SEEN NONE SEEN   Clue Cells Wet Prep HPF POC NONE SEEN NONE SEEN   WBC, Wet Prep HPF POC FEW (A) NONE SEEN   Sperm NONE SEEN    US Ob Transvaginal  07/01/2015  CLINICAL DATA:  Groin pain for few days.  Gravida 2  AB1. EXAM: TRANSVAGINAL OB ULTRASOUND TECHNIQUE: Transvaginal ultrasound was performed for complete evaluation of the gestation as well as the maternal uterus, adnexal regions, and pelvic cul-de-sac. COMPARISON:  06/25/2015 FINDINGS: Intrauterine gestational sac: Present Yolk sac:  Not seen Embryo:  Not seen Cardiac Activity: Not seen Heart Rate: Absent bpm MSD: 3.7  mm   5 w   1  d Subchorionic hemorrhage:  None visualized. Maternal uterus/adnexae: The ovaries have a normal appearance. Right corpus luteum cyst is 3.1 cm. Trace free pelvic fluid. IMPRESSION: 1. Intrauterine gestational sac without embryo or yolk sac at this time. 2. Follow-up ultrasound is recommended in 14 or more days to document presence of fetal pole and for dating purposes. Electronically Signed   By: Nolon Nations M.D.   On: 07/01/2015 16:50    Review of Systems  Constitutional: Negative for fever and chills.  Gastrointestinal: Positive for nausea. Negative for vomiting.  Genitourinary: Negative for dysuria.   Physical Exam   Blood pressure 128/77, pulse 94, temperature 99.6 F (37.6 C), temperature source Oral, resp. rate 18, height 5' 9"  (1.753 m), weight 217 lb 9.6 oz (98.703 kg), last menstrual period 05/21/2015.  Physical Exam  Constitutional: She is oriented to person, place, and time. She appears well-developed and well-nourished. No distress.  HENT:  Head: Normocephalic.  Respiratory: Effort normal.  GI: Soft. She exhibits no distension. There is no tenderness. There is no rebound and no guarding.  Genitourinary:  Wet prep and GC collected without speculum.   Musculoskeletal: Normal range of motion.  Neurological: She is alert and oriented to person, place, and time.  Skin: Skin is warm. She is not diaphoretic.  Psychiatric: Her behavior is normal.    MAU Course  Procedures  None  MDM A positive blood type   Quant 3/3: 206 Quant 3/9: 2480 Wet prep GC pending US shows gestational sac only>  appropriate rise in beta hcg levels.   Assessment and Plan   A:  1. Elevated level of quantitative hCG for gestational age in early pregnancy   2. Abdominal pain in pregnancy, antepartum   3. Pregnancy of unknown anatomic location      P:  Discharge home in stable condition  Avoid ibuprofen in the 1st trimester Ectopic precautions Return to MAU on Saturday 3/11 for serial beta hcg  level; Will plan repeat US in the next 7-10 days.  Return to MAU if pain worsens Pelvic rest  Lezlie Lye, NP 07/01/2015 8:40 PM

## 2015-07-02 LAB — GC/CHLAMYDIA PROBE AMP (~~LOC~~) NOT AT ARMC
Chlamydia: NEGATIVE
Neisseria Gonorrhea: NEGATIVE

## 2015-07-09 ENCOUNTER — Inpatient Hospital Stay (HOSPITAL_COMMUNITY)
Admission: AD | Admit: 2015-07-09 | Discharge: 2015-07-09 | Disposition: A | Discharge status: Home / Self Care | Payer: BLUE CROSS/BLUE SHIELD | Source: Ambulatory Visit | Attending: Obstetrics & Gynecology | Admitting: Obstetrics & Gynecology

## 2015-07-09 ENCOUNTER — Inpatient Hospital Stay (HOSPITAL_COMMUNITY): Payer: BLUE CROSS/BLUE SHIELD

## 2015-07-09 DIAGNOSIS — O30049 Twin pregnancy, dichorionic/diamniotic, unspecified trimester: Secondary | ICD-10-CM

## 2015-07-09 DIAGNOSIS — O26891 Other specified pregnancy related conditions, first trimester: Secondary | ICD-10-CM | POA: Insufficient documentation

## 2015-07-09 DIAGNOSIS — Z885 Allergy status to narcotic agent status: Secondary | ICD-10-CM | POA: Diagnosis not present

## 2015-07-09 DIAGNOSIS — O30041 Twin pregnancy, dichorionic/diamniotic, first trimester: Secondary | ICD-10-CM | POA: Insufficient documentation

## 2015-07-09 DIAGNOSIS — O9989 Other specified diseases and conditions complicating pregnancy, childbirth and the puerperium: Secondary | ICD-10-CM | POA: Diagnosis not present

## 2015-07-09 DIAGNOSIS — O26899 Other specified pregnancy related conditions, unspecified trimester: Secondary | ICD-10-CM

## 2015-07-09 DIAGNOSIS — R109 Unspecified abdominal pain: Secondary | ICD-10-CM | POA: Insufficient documentation

## 2015-07-09 DIAGNOSIS — Z3A01 Less than 8 weeks gestation of pregnancy: Secondary | ICD-10-CM | POA: Diagnosis not present

## 2015-07-09 LAB — HCG, QUANTITATIVE, PREGNANCY: hCG, Beta Chain, Quant, S: 21975 m[IU]/mL — ABNORMAL HIGH (ref <5)

## 2015-07-09 NOTE — MAU Provider Note (Signed)
History     CSN: [id number]  Arrival date and time: 07/09/15 1242   None     Chief Complaint  Patient presents with  . Follow-up   HPI Carla Henderson is 25 y.o. G2P0010 28w0dweeks presents for follow up.  She reports left sided pain resolved but has returned rating pain 7/10.  Neg for vaginal bleeding.  Seen initially on 3/3 for abdominal pain and  +HPT.  On that visit BHCG 206. She did not return on 3/5 for follow up BHCG but did return 3/9 with recurrent abdominal pain.   On that date BHCG 2480 and U/S showed 573w1dUGS without embryo and YS.  She was a patient of [facility] and their CNM note on 3/3 states the patient took Letrozol in December.  The patient states she has transferred her care to the Clinic.      Past Medical History  Diagnosis Date  . UTI (lower urinary tract infection)   . Abnormal Pap smear   . Anemia   . PCOS (polycystic ovarian syndrome)   . Bronchitis   . Ovarian cyst   . Infertility, female     Past Surgical History  Procedure Laterality Date  . Wisdom tooth extraction    . Laparoscopic ovarian cystectomy Left 01/07/2015    Procedure: Attempted LAPAROSCOPIC OVARIAN CYSTECTOMY, Mini laparotomy with excision of left ovarian cyst;  Surgeon: PeMora BellmanMD;  [facility];  Service: Gynecology;  Laterality: Left;  Requested 01-26-15 @ 3:30p    Family History  Problem Relation Age of Onset  . Other Neg Hx   . Hypertension Paternal Grandmother   . Diabetes Paternal Grandfather     Social History  Substance Use Topics  . Smoking status: Never Smoker   . Smokeless tobacco: Never Used  . Alcohol Use: No     Comment: occassional     Allergies:  Allergies  Allergen Reactions  . Vicodin [Hydrocodone-Acetaminophen] Nausea And Vomiting    Pt states does not cause swelling    Prescriptions prior to admission  Medication Sig Dispense Refill Last Dose  . Docosahexaenoic Acid (DHA PO) Take 1 tablet by mouth daily.   07/01/2015 at Unknown time  .  Prenatal Vit-Fe Fumarate-FA (PRENATAL MULTIVITAMIN) TABS tablet Take 1 tablet by mouth daily at 12 noon.   07/01/2015 at Unknown time    Review of Systems  Gastrointestinal: Positive for abdominal pain. Negative for nausea and vomiting.  Genitourinary: Negative for dysuria, urgency and frequency.       Neg for vaginal bleeding.   Physical Exam   Blood pressure 127/83, pulse 90, temperature 98.2 F (36.8 C), temperature source Oral, resp. rate 18, last menstrual period 05/21/2015.  Physical Exam  Constitutional: She appears well-developed and well-nourished. No distress.  Genitourinary:  deferred  Psychiatric: She has a normal mood and affect. Her behavior is normal. Thought content normal.   Results for orders placed or performed during the hospital encounter of 07/09/15 (from the past 24 hour(s))  hCG, quantitative, pregnancy     Status: Abnormal   Collection Time: 07/09/15 12:52 PM  Result Value Ref Range   hCG, Beta Chain, Quant, S 21975 (H) <5 mIU/mL    [facility] Transvaginal  07/09/2015  CLINICAL DATA:  89ear old pregnant female presenting with intermittent left pelvic pain. Intrauterine pregnancy of uncertain viability on most recent obstetric scan. EDC by LMP: 02/25/2016, projecting to an expected gestational age of [redacted] weeks 0 days. EXAM: TRANSVAGINAL OB ULTRASOUND TECHNIQUE: Transvaginal ultrasound was  performed for complete evaluation of the gestation as well as the maternal uterus, adnexal regions, and pelvic cul-de-sac. COMPARISON:  07/01/2015 and 06/25/2015 obstetric scans. FINDINGS: There is a dichorionic diamniotic twin intrauterine gestation. Both intrauterine gestational sacs appear normal in size, shape and position. Both gestational sacs demonstrate an embryo and yolk sac. No perigestational bleeds. Presenting twin "AAA": CRL:  3.2 mm      5 weeks 6 days Embryonic heart rate: 99 bpm Nonpresenting twin "BBB": CRL:  3.3 mm      6 weeks 0 days Embryonic heart rate:  107 bpm The  maternal right ovary measures 4.4 x 3.7 x 3.6 cm and contains a simple 3.1 cm cyst likely representing a corpus luteum. The maternal left ovary measures 4.7 x 3.0 x 3.5 cm. There are no suspicious ovarian or adnexal masses. No uterine fibroids. Small volume nonspecific simple appearing free fluid in the right adnexa. IMPRESSION: 1. Living dichorionic diamniotic twin intrauterine gestations. 2. Presenting twin measures 5 weeks 6 days by crown-rump length. 3. Non presenting twin measures 6 weeks 0 days by crown-rump length. 4. Both embryos demonstrate low normal heart rates. Consider follow-up obstetric scan in 4 weeks to demonstrate appropriate fetal growth. 5. No suspicious maternal ovarian or adnexal findings. Electronically Signed   By: Ilona Sorrel M.D.   On: 07/09/2015 14:02   MAU Course  Procedures  MDM Discussed US findings with the patient.  She is thrilled.  Assessment and Plan  A: Viable intrauterine Di-Di twins at 72w6dand 639w0destation     Abdominal pain     Follow up U/S P:  Instructed to return for worsening sxs       Has appt scheduled for 4/25 in the clinic       Take prenatal vitamins daily  Robey Massmann,EVE M 07/09/2015, 1:58 PM

## 2015-07-09 NOTE — MAU Note (Signed)
Patient here for follow up BHCG having pain on left side went away and came back, rates pain 7/10, no vaginal bleeding.

## 2015-07-09 NOTE — Discharge Instructions (Signed)
Abdominal Pain During Pregnancy Belly (abdominal) pain is common during pregnancy. Most of the time, it is not a serious problem. Other times, it can be a sign that something is wrong with the pregnancy. Always tell your doctor if you have belly pain. HOME CARE Monitor your belly pain for any changes. The following actions may help you feel better:  Do not have sex (intercourse) or put anything in your vagina until you feel better.  Rest until your pain stops.  Drink clear fluids if you feel sick to your stomach (nauseous). Do not eat solid food until you feel better.  Only take medicine as told by your doctor.  Keep all doctor visits as told. GET HELP RIGHT AWAY IF:   You are bleeding, leaking fluid, or pieces of tissue come out of your vagina.  You have more pain or cramping.  You keep throwing up (vomiting).  You have pain when you pee (urinate) or have blood in your pee.  You have a fever.  You do not feel your baby moving as much.  You feel very weak or feel like passing out.  You have trouble breathing, with or without belly pain.  You have a very bad headache and belly pain.  You have fluid leaking from your vagina and belly pain.  You keep having watery poop (diarrhea).  Your belly pain does not go away after resting, or the pain gets worse. MAKE SURE YOU:   Understand these instructions.  Will watch your condition.  Will get help right away if you are not doing well or get worse.   This information is not intended to replace advice given to you by your health care provider. Make sure you discuss any questions you have with your health care provider.   Document Released: 03/29/2009 Document Revised: 12/11/2012 Document Reviewed: 11/07/2012 Elsevier Interactive Patient Education [employer].

## 2015-07-15 ENCOUNTER — Encounter (HOSPITAL_COMMUNITY): Payer: Self-pay | Admitting: Advanced Practice Midwife

## 2015-07-15 ENCOUNTER — Inpatient Hospital Stay (HOSPITAL_COMMUNITY): Payer: BLUE CROSS/BLUE SHIELD

## 2015-07-15 ENCOUNTER — Inpatient Hospital Stay (HOSPITAL_COMMUNITY)
Admission: AD | Admit: 2015-07-15 | Discharge: 2015-07-15 | Disposition: A | Discharge status: Home / Self Care | Payer: BLUE CROSS/BLUE SHIELD | Source: Ambulatory Visit | Attending: Obstetrics and Gynecology | Admitting: Obstetrics and Gynecology

## 2015-07-15 DIAGNOSIS — O209 Hemorrhage in early pregnancy, unspecified: Secondary | ICD-10-CM | POA: Diagnosis not present

## 2015-07-15 DIAGNOSIS — O219 Vomiting of pregnancy, unspecified: Secondary | ICD-10-CM | POA: Insufficient documentation

## 2015-07-15 DIAGNOSIS — N939 Abnormal uterine and vaginal bleeding, unspecified: Secondary | ICD-10-CM | POA: Diagnosis present

## 2015-07-15 DIAGNOSIS — Z885 Allergy status to narcotic agent status: Secondary | ICD-10-CM | POA: Diagnosis not present

## 2015-07-15 DIAGNOSIS — Z3A01 Less than 8 weeks gestation of pregnancy: Secondary | ICD-10-CM | POA: Insufficient documentation

## 2015-07-15 DIAGNOSIS — O418X10 Other specified disorders of amniotic fluid and membranes, first trimester, not applicable or unspecified: Secondary | ICD-10-CM

## 2015-07-15 DIAGNOSIS — O43891 Other placental disorders, first trimester: Secondary | ICD-10-CM

## 2015-07-15 DIAGNOSIS — O30041 Twin pregnancy, dichorionic/diamniotic, first trimester: Secondary | ICD-10-CM | POA: Insufficient documentation

## 2015-07-15 DIAGNOSIS — O468X1 Other antepartum hemorrhage, first trimester: Secondary | ICD-10-CM

## 2015-07-15 DIAGNOSIS — O4690 Antepartum hemorrhage, unspecified, unspecified trimester: Secondary | ICD-10-CM

## 2015-07-15 LAB — URINALYSIS, ROUTINE W REFLEX MICROSCOPIC
Bilirubin Urine: NEGATIVE
GLUCOSE, UA: NEGATIVE mg/dL
Ketones, ur: NEGATIVE mg/dL
Leukocytes, UA: NEGATIVE
Nitrite: NEGATIVE
Protein, ur: NEGATIVE mg/dL
SPECIFIC GRAVITY, URINE: 1.02 (ref 1.005–1.030)
pH: 5.5 (ref 5.0–8.0)

## 2015-07-15 LAB — URINE MICROSCOPIC-ADD ON

## 2015-07-15 MED ORDER — METOCLOPRAMIDE HCL 10 MG PO TABS: 10.0000 mg | ORAL_TABLET | Freq: Three times a day (TID) | ORAL | Status: DC

## 2015-07-15 NOTE — MAU Note (Addendum)
Pt presents to MAU with complaints of vaginal bleeding today when she wipes. Intercourse 2 days ago. Denies any abdominal cramping, states she feels bloated  Pt has a twin gestation

## 2015-07-15 NOTE — MAU Provider Note (Signed)
History     CSN: [id number]  Arrival date and time: 07/15/15 1534   First Provider Initiated Contact with Patient 07/15/15 1730      Chief Complaint  Patient presents with  . Vaginal Bleeding   HPI   Ms.Carla Henderson is a 25 y.o. female G2P0010 at 37w5ddi di twins presents to MAU with vaginal spotting. The spotting started today. It is described as brown in color, it was noted in the toilet at 1400. She wiped again after using the bathroom at 1500 at it was still there; still brown in color.   She denies pain at this time.   She is scheduled to have her first prenatal visit in April   OB History    Gravida Para Term Preterm AB TAB SAB Ectopic Multiple Living   2    1  1    0      Past Medical History  Diagnosis Date  . UTI (lower urinary tract infection)   . Abnormal Pap smear   . Anemia   . PCOS (polycystic ovarian syndrome)   . Bronchitis   . Ovarian cyst   . Infertility, female     Past Surgical History  Procedure Laterality Date  . Wisdom tooth extraction    . Laparoscopic ovarian cystectomy Left 01/07/2015    Procedure: Attempted LAPAROSCOPIC OVARIAN CYSTECTOMY, Mini laparotomy with excision of left ovarian cyst;  Surgeon: PMora Bellman MD;  [facility];  Service: Gynecology;  Laterality: Left;  Requested 01-26-15 @ 3:30p    Family History  Problem Relation Age of Onset  . Other Neg Hx   . Hypertension Paternal Grandmother   . Diabetes Paternal Grandfather     Social History  Substance Use Topics  . Smoking status: Never Smoker   . Smokeless tobacco: Never Used  . Alcohol Use: No     Comment: occassional     Allergies:  Allergies  Allergen Reactions  . Vicodin [Hydrocodone-Acetaminophen] Nausea And Vomiting    Pt states does not cause swelling    Prescriptions prior to admission  Medication Sig Dispense Refill Last Dose  . Docosahexaenoic Acid (DHA PO) Take 1 tablet by mouth daily.   07/01/2015 at Unknown time  . Prenatal Vit-Fe  Fumarate-FA (PRENATAL MULTIVITAMIN) TABS tablet Take 1 tablet by mouth daily at 12 noon.   07/01/2015 at Unknown time   Results for orders placed or performed during the hospital encounter of 07/15/15 (from the past 48 hour(s))  Urinalysis, [facility] reflex [facility]     Status: Abnormal   Collection Time: 07/15/15  4:00 PM  Result Value Ref Range   Color, Urine YELLOW YELLOW   APPearance CLEAR CLEAR   Specific Gravity, Urine 1.020 1.005 - 1.030   pH 5.5 5.0 - 8.0   Glucose, UA NEGATIVE NEGATIVE mg/dL   Hgb urine dipstick SMALL (A) NEGATIVE   Bilirubin Urine NEGATIVE NEGATIVE   Ketones, ur NEGATIVE NEGATIVE mg/dL   Protein, ur NEGATIVE NEGATIVE mg/dL   Nitrite NEGATIVE NEGATIVE   Leukocytes, UA NEGATIVE NEGATIVE  Urine microscopic-add on     Status: Abnormal   Collection Time: 07/15/15  4:00 PM  Result Value Ref Range   Squamous Epithelial / LPF 6-30 (A) NONE SEEN   WBC, UA 0-5 0 - 5 WBC/hpf   RBC / HPF 0-5 0 - 5 RBC/hpf   Bacteria, UA FEW (A) NONE SEEN   [place] Transvaginal  07/15/2015  CLINICAL DATA:  Vaginal bleeding.  Pregnancy.  EXAM: TWIN OBSTETRICAL ULTRASOUND <14 WKS COMPARISON:  07/09/2015 FINDINGS: TWIN 1 Intrauterine gestational sac: Visualized/normal in shape. Yolk sac:  Present Embryo:  Present Cardiac Activity: Present Heart Rate: 129 bpm CRL:   6.9  mm   6 w 5d                  [place] EDC: 03/04/2016 TWIN 2 Intrauterine gestational sac: Visualized/normal in shape. Yolk sac:  Present Embryo:  Present Cardiac Activity: Present Heart Rate: 138 bpm CRL:   7.2  mm   6 w 5d                  [place] EDC: 03/04/2016 Maternal uterus/adnexae: Small subchorionic hemorrhage located between the 2 gestations. Corpus luteum cyst of the right ovary ovaries are otherwise normal. No adnexal mass. No pelvic free fluid. IMPRESSION: 1. Small subchorionic hemorrhage located between the 2 gestations. 2. Twin live dichorionic diamniotic intrauterine pregnancies as described above. Electronically  Signed   By: Kathreen Devoid   On: 07/15/2015 18:34     Review of Systems  Constitutional: Negative for fever and chills.  Gastrointestinal: Positive for nausea and vomiting. Negative for heartburn and abdominal pain.  Genitourinary: Negative for dysuria.   Physical Exam   Blood pressure 125/73, pulse 85, temperature 98.5 F (36.9 C), resp. rate 18, last menstrual period 05/21/2015.  Physical Exam  Constitutional: She is oriented to person, place, and time. She appears well-developed and well-nourished. No distress.  HENT:  Head: Normocephalic.  Eyes: Pupils are equal, round, and reactive to light.  Genitourinary:  Speculum exam: Vagina - Small amount of creamy, brown discharge, no odor Cervix - No contact bleeding, no active bleeding  Bimanual exam: Cervix closed Uterus non tender, normal size Adnexa non tender, no masses bilaterally Chaperone present for exam.  Musculoskeletal: Normal range of motion.  Neurological: She is alert and oriented to person, place, and time.  Skin: Skin is warm. She is not diaphoretic.  Psychiatric: Her behavior is normal.    MAU Course  Procedures  None  MDM  A positive blood type   Assessment and Plan   A:    ICD-9-CM ICD-10-CM   1. Subchorionic hematoma in first trimester 656.73 O43.891   2. Vaginal bleeding during pregnancy, antepartum 641.93 O46.90   3. Nausea and vomiting in pregnancy 643.90 O21.9     P:  Discharge home in stable condition RX: Reglan Bleeding precautions Pelvic rest Keep OB appointment as scheduled   Lezlie Lye, NP 07/15/2015 7:43 PM

## 2015-07-15 NOTE — Discharge Instructions (Signed)
Subchorionic Hematoma A subchorionic hematoma is a gathering of blood between the outer wall of the placenta and the inner wall of the womb (uterus). The placenta is the organ that connects the fetus to the wall of the uterus. The placenta performs the feeding, breathing (oxygen to the fetus), and waste removal (excretory work) of the fetus.  Subchorionic hematoma is the most common abnormality found on a result from ultrasonography done during the first trimester or early second trimester of pregnancy. If there has been little or no vaginal bleeding, early small hematomas usually shrink on their own and do not affect your baby or pregnancy. The blood is gradually absorbed over 1-2 weeks. When bleeding starts later in pregnancy or the hematoma is larger or occurs in an older pregnant woman, the outcome may not be as good. Larger hematomas may get bigger, which increases the chances for miscarriage. Subchorionic hematoma also increases the risk of premature detachment of the placenta from the uterus, preterm (premature) labor, and stillbirth. HOME CARE INSTRUCTIONS  Stay on bed rest if your health care provider recommends this. Although bed rest will not prevent more bleeding or prevent a miscarriage, your health care provider may recommend bed rest until you are advised otherwise.  Avoid heavy lifting (more than 10 lb [4.5 kg]), exercise, sexual intercourse, or douching as directed by your health care provider.  Keep track of the number of pads you use each day and how soaked (saturated) they are. Write down this information.  Do not use tampons.  Keep all follow-up appointments as directed by your health care provider. Your health care provider may ask you to have follow-up blood tests or ultrasound tests or both. SEEK IMMEDIATE MEDICAL CARE IF:  You have severe cramps in your stomach, back, abdomen, or pelvis.  You have a fever.  You pass large clots or tissue. Save any tissue for your health  care provider to look at.  Your bleeding increases or you become lightheaded, feel weak, or have fainting episodes.   This information is not intended to replace advice given to you by your health care provider. Make sure you discuss any questions you have with your health care provider.   Document Released: 07/26/2006 Document Revised: 05/01/2014 Document Reviewed: 11/07/2012 Elsevier Interactive Patient Education [employer].

## 2015-08-09 ENCOUNTER — Encounter (HOSPITAL_COMMUNITY): Payer: Self-pay | Admitting: *Deleted

## 2015-08-09 ENCOUNTER — Inpatient Hospital Stay (HOSPITAL_COMMUNITY)
Admission: AD | Admit: 2015-08-09 | Discharge: 2015-08-09 | Disposition: A | Discharge status: Home / Self Care | Payer: BLUE CROSS/BLUE SHIELD | Source: Ambulatory Visit | Attending: Family Medicine | Admitting: Family Medicine

## 2015-08-09 DIAGNOSIS — O209 Hemorrhage in early pregnancy, unspecified: Secondary | ICD-10-CM | POA: Diagnosis not present

## 2015-08-09 DIAGNOSIS — O4691 Antepartum hemorrhage, unspecified, first trimester: Secondary | ICD-10-CM

## 2015-08-09 DIAGNOSIS — O30001 Twin pregnancy, unspecified number of placenta and unspecified number of amniotic sacs, first trimester: Secondary | ICD-10-CM | POA: Insufficient documentation

## 2015-08-09 DIAGNOSIS — Z3A10 10 weeks gestation of pregnancy: Secondary | ICD-10-CM | POA: Insufficient documentation

## 2015-08-09 LAB — URINALYSIS, ROUTINE W REFLEX MICROSCOPIC
BILIRUBIN URINE: NEGATIVE
Glucose, UA: NEGATIVE mg/dL
Hgb urine dipstick: NEGATIVE
KETONES UR: 15 mg/dL — AB
Leukocytes, UA: NEGATIVE
NITRITE: NEGATIVE
PROTEIN: NEGATIVE mg/dL
Specific Gravity, Urine: 1.02 (ref 1.005–1.030)
pH: 6 (ref 5.0–8.0)

## 2015-08-09 LAB — WET PREP, GENITAL
Clue Cells Wet Prep HPF POC: NONE SEEN
Sperm: NONE SEEN
TRICH WET PREP: NONE SEEN
YEAST WET PREP: NONE SEEN

## 2015-08-09 NOTE — MAU Note (Addendum)
Pt states she went to the restroom around 1300, saw blood with wiping, also blood in the toilet.  Occasionally has light cramping, none right now.

## 2015-08-09 NOTE — MAU Provider Note (Signed)
History   G1P0010 @ 10.2 wks with twin gestation in with episode of bleeding with urination and when she wiped.  CSN: [id number]  Arrival date & time 08/09/15  1304   First Provider Initiated Contact with Patient 08/09/15 1358      Chief Complaint  Patient presents with  . Vaginal Bleeding    HPI  Past Medical History  Diagnosis Date  . UTI (lower urinary tract infection)   . Abnormal Pap smear   . Anemia   . PCOS (polycystic ovarian syndrome)   . Bronchitis   . Ovarian cyst   . Infertility, female     Past Surgical History  Procedure Laterality Date  . Wisdom tooth extraction    . Laparoscopic ovarian cystectomy Left 01/07/2015    Procedure: Attempted LAPAROSCOPIC OVARIAN CYSTECTOMY, Mini laparotomy with excision of left ovarian cyst;  Surgeon: Mora Bellman, MD;  Location: [facility];  Service: Gynecology;  Laterality: Left;  Requested 01-26-15 @ 3:30p    Family History  Problem Relation Age of Onset  . Other Neg Hx   . Hypertension Paternal Grandmother   . Diabetes Paternal Grandfather     Social History  Substance Use Topics  . Smoking status: Never Smoker   . Smokeless tobacco: Never Used  . Alcohol Use: No     Comment: occassional     OB History    Gravida Para Term Preterm AB TAB SAB Ectopic Multiple Living   2    1  1    0      Review of Systems  Constitutional: Negative.   HENT: Negative.   Eyes: Negative.   Respiratory: Negative.   Cardiovascular: Negative.   Gastrointestinal: Negative.   Endocrine: Negative.   Genitourinary: Positive for vaginal bleeding.  Musculoskeletal: Negative.   Skin: Negative.   Allergic/Immunologic: Negative.   Neurological: Negative.   Hematological: Negative.   Psychiatric/Behavioral: Negative.     Allergies  Vicodin  Home Medications  No current outpatient prescriptions on file.  BP 139/81 mmHg  Pulse 110  Resp 18  Ht 5' 9"  (1.753 m)  Wt 224 lb 12.8 oz (101.969 kg)  BMI 33.18 kg/m2  LMP  05/21/2015  Physical Exam  Constitutional: She is oriented to person, place, and time. She appears well-developed and well-nourished.  HENT:  Head: Normocephalic.  Eyes: Pupils are equal, round, and reactive to light.  Neck: Normal range of motion. Neck supple.  Cardiovascular: Normal rate, regular rhythm, normal heart sounds and intact distal pulses.   Pulmonary/Chest: Effort normal and breath sounds normal.  Abdominal: Soft. Bowel sounds are normal.  Genitourinary: Vagina normal and uterus normal.  Musculoskeletal: Normal range of motion.  Neurological: She is alert and oriented to person, place, and time. She has normal reflexes.  Skin: Skin is warm and dry.  Psychiatric: She has a normal mood and affect. Her behavior is normal. Judgment and thought content normal.    MAU Course  Procedures (including critical care time)  Labs Reviewed  WET PREP, GENITAL  URINALYSIS, ROUTINE W REFLEX MICROSCOPIC (NOT AT [facility])  GC/CHLAMYDIA PROBE AMP ([place]) NOT AT [facility]   No results found.   No diagnosis found.    MDM  Bedside u/s shows viable twin IUP. Sterile spec exam cervix closed, no active bleeding noted. Wet prep neg except for  WBC's and cultures obtained and to lab.urine neg will d/c home

## 2015-08-09 NOTE — Discharge Instructions (Signed)
Vaginal Bleeding During Pregnancy, First Trimester A small amount of bleeding (spotting) from the vagina is common in early pregnancy. Sometimes the bleeding is normal and is not a problem, and sometimes it is a sign of something serious. Be sure to tell your doctor about any bleeding from your vagina right away. HOME CARE  Watch your condition for any changes.  Follow your doctor's instructions about how active you can be.  If you are on bed rest:  You may need to stay in bed and only get up to use the bathroom.  You may be allowed to do some activities.  If you need help, make plans for someone to help you.  Write down:  The number of pads you use each day.  How often you change pads.  How soaked (saturated) your pads are.  Do not use tampons.  Do not douche.  Do not have sex or orgasms until your doctor says it is okay.  If you pass any tissue from your vagina, save the tissue so you can show it to your doctor.  Only take medicines as told by your doctor.  Do not take aspirin because it can make you bleed.  Keep all follow-up visits as told by your doctor. GET HELP IF:   You bleed from your vagina.  You have cramps.  You have labor pains.  You have a fever that does not go away after you take medicine. GET HELP RIGHT AWAY IF:   You have very bad cramps in your back or belly (abdomen).  You pass large clots or tissue from your vagina.  You bleed more.  You feel light-headed or weak.  You pass out (faint).  You have chills.  You are leaking fluid or have a gush of fluid from your vagina.  You pass out while pooping (having a bowel movement). MAKE SURE YOU:  Understand these instructions.  Will watch your condition.  Will get help right away if you are not doing well or get worse.   This information is not intended to replace advice given to you by your health care provider. Make sure you discuss any questions you have with your health care  provider.   Document Released: 08/25/2013 Document Reviewed: 08/25/2013 Elsevier Interactive Patient Education [employer].

## 2015-08-10 LAB — GC/CHLAMYDIA PROBE AMP (~~LOC~~) NOT AT ARMC
Chlamydia: NEGATIVE
Neisseria Gonorrhea: NEGATIVE

## 2015-08-17 ENCOUNTER — Encounter: Payer: Self-pay | Admitting: Obstetrics and Gynecology

## 2015-08-17 ENCOUNTER — Ambulatory Visit (INDEPENDENT_AMBULATORY_CARE_PROVIDER_SITE_OTHER): Payer: BLUE CROSS/BLUE SHIELD | Admitting: Obstetrics and Gynecology

## 2015-08-17 VITALS — BP 132/78 | HR 112 | Wt 224.2 lb

## 2015-08-17 DIAGNOSIS — Z124 Encounter for screening for malignant neoplasm of cervix: Secondary | ICD-10-CM

## 2015-08-17 DIAGNOSIS — O30001 Twin pregnancy, unspecified number of placenta and unspecified number of amniotic sacs, first trimester: Secondary | ICD-10-CM | POA: Diagnosis not present

## 2015-08-17 DIAGNOSIS — E663 Overweight: Secondary | ICD-10-CM

## 2015-08-17 DIAGNOSIS — O0991 Supervision of high risk pregnancy, unspecified, first trimester: Secondary | ICD-10-CM

## 2015-08-17 DIAGNOSIS — O0990 Supervision of high risk pregnancy, unspecified, unspecified trimester: Secondary | ICD-10-CM | POA: Insufficient documentation

## 2015-08-17 LAB — POCT URINALYSIS DIP (DEVICE)
Bilirubin Urine: NEGATIVE
Glucose, UA: NEGATIVE mg/dL
Hgb urine dipstick: NEGATIVE
Ketones, ur: NEGATIVE mg/dL
Nitrite: NEGATIVE
Protein, ur: NEGATIVE mg/dL
Specific Gravity, Urine: 1.02 (ref 1.005–1.030)
Urobilinogen, UA: 0.2 mg/dL (ref 0.0–1.0)
pH: 7.5 (ref 5.0–8.0)

## 2015-08-17 MED ORDER — CONCEPT OB 130-92.4-1 MG PO CAPS: 1.0000 | ORAL_CAPSULE | Freq: Every day | ORAL | Status: DC

## 2015-08-17 NOTE — Progress Notes (Signed)
Subjective:    Carla Henderson is a G2P0010 53w3dbeing seen today for her first obstetrical visit.  Her obstetrical history is significant for SAB x1. Patient does intend to breast feed. Pregnancy history fully reviewed.  Patient reports nausea and vomiting. Has antiemetic RX, but not taking yet. [facility]   Filed Vitals:   08/17/15 1249 08/17/15 1307  BP: 141/87 132/78  Pulse: 112   Weight: 224 lb 3.2 oz (101.696 kg)     HISTORY: OB History  Gravida Para Term Preterm AB SAB TAB Ectopic Multiple Living  2    1 1     0    # Outcome Date GA Lbr Len/2nd Weight Sex Delivery Anes PTL Lv  2 Current           1 SAB 2014 815w0d          [facility]     Past Medical History  Diagnosis Date  . UTI (lower urinary tract infection)   . Abnormal Pap smear   . Anemia   . PCOS (polycystic ovarian syndrome)   . Bronchitis   . Ovarian cyst   . Infertility, female    Past Surgical History  Procedure Laterality Date  . Wisdom tooth extraction    . Laparoscopic ovarian cystectomy Left 01/07/2015    Procedure: Attempted LAPAROSCOPIC OVARIAN CYSTECTOMY, Mini laparotomy with excision of left ovarian cyst;  Surgeon: PeMora BellmanMD;  [facility];  Service: Gynecology;  Laterality: Left;  Requested 01-26-15 @ 3:30p   Family History  Problem Relation Age of Onset  . Other Neg Hx   . Hypertension Paternal Grandmother   . Diabetes Paternal Grandfather      Exam    Uterus:   14 wk size  Pelvic Exam:    Perineum: No Hemorrhoids, Normal Perineum   Vulva: normal, Bartholin's, Urethra, Skene's normal   Vagina:  normal mucosa, normal discharge       Cervix: no bleeding following Pap and no lesions   Adnexa: not evaluated   Bony Pelvis: average  System: Breast:  normal appearance, no masses or tenderness, Inspection negative, Normal to palpation without dominant masses   Skin: normal coloration and turgor, no rashes    Neurologic: oriented, grossly non-focal   Extremities: normal strength, tone, and muscle mass, no musculoskeletal defects noted   HEENT PERRLA and extra ocular movement intact   Mouth/Teeth mucous membranes moist, pharynx normal without lesions and dental hygiene good   Neck supple and no masses   Cardiovascular: regular rate and rhythm, no murmurs or gallops   Respiratory:  appears well, vitals normal, no respiratory distress, acyanotic, normal RR, ear and throat exam is normal, neck free of mass or lymphadenopathy, chest clear, no wheezing, crepitations, rhonchi, normal symmetric air entry   Abdomen: soft, non-tender; bowel sounds normal; no masses,  no organomegaly   Urinary: urethral meatus normal      Assessment:    Pregnancy: G2P0010 Patient Active Problem List   Diagnosis Date Noted  . Twin gestation in first trimester 08/17/2015  . Overweight 08/17/2015  Borderline BP> follow      Plan:     Initial labs drawn. Early GCT done. Prenatal vitamins. Problem list reviewed and updated. Genetic Screening discussed First Screen: requested and scheduled  Ultrasound discussed; fetal survey: requested.  Follow up in 4 weeks HRC 50% of 30 min visit spent on counseling and coordination of care.  Watch  BP  POE,DEIRDRE 08/17/2015

## 2015-08-17 NOTE — Patient Instructions (Signed)
First Trimester of Pregnancy The first trimester of pregnancy is from week 1 until the end of week 12 (months 1 through 3). A week after a sperm fertilizes an egg, the egg will implant on the wall of the uterus. This embryo will begin to develop into a baby. Genes from you and your partner are forming the baby. The female genes determine whether the baby is a boy or a girl. At 6-8 weeks, the eyes and face are formed, and the heartbeat can be seen on ultrasound. At the end of 12 weeks, all the baby's organs are formed.  Now that you are pregnant, you will want to do everything you can to have a healthy baby. Two of the most important things are to get good prenatal care and to follow your health care provider's instructions. Prenatal care is all the medical care you receive before the baby's birth. This care will help prevent, find, and treat any problems during the pregnancy and childbirth. BODY CHANGES Your body goes through many changes during pregnancy. The changes vary from woman to woman.   You may gain or lose a couple of pounds at first.  You may feel sick to your stomach (nauseous) and throw up (vomit). If the vomiting is uncontrollable, call your health care provider.  You may tire easily.  You may develop headaches that can be relieved by medicines approved by your health care provider.  You may urinate more often. Painful urination may mean you have a bladder infection.  You may develop heartburn as a result of your pregnancy.  You may develop constipation because certain hormones are causing the muscles that push waste through your intestines to slow down.  You may develop hemorrhoids or swollen, bulging veins (varicose veins).  Your breasts may begin to grow larger and become tender. Your nipples may stick out more, and the tissue that surrounds them (areola) may become darker.  Your gums may bleed and may be sensitive to brushing and flossing.  Dark spots or blotches (chloasma,  mask of pregnancy) may develop on your face. This will likely fade after the baby is born.  Your menstrual periods will stop.  You may have a loss of appetite.  You may develop cravings for certain kinds of food.  You may have changes in your emotions from day to day, such as being excited to be pregnant or being concerned that something may go wrong with the pregnancy and baby.  You may have more vivid and strange dreams.  You may have changes in your hair. These can include thickening of your hair, rapid growth, and changes in texture. Some women also have hair loss during or after pregnancy, or hair that feels dry or thin. Your hair will most likely return to normal after your baby is born. WHAT TO EXPECT AT YOUR PRENATAL VISITS During a routine prenatal visit:  You will be weighed to make sure you and the baby are growing normally.  Your blood pressure will be taken.  Your abdomen will be measured to track your baby's growth.  The fetal heartbeat will be listened to starting around week 10 or 12 of your pregnancy.  Test results from any previous visits will be discussed. Your health care provider may ask you:  How you are feeling.  If you are feeling the baby move.  If you have had any abnormal symptoms, such as leaking fluid, bleeding, severe headaches, or abdominal cramping.  If you are using any tobacco products,   including cigarettes, chewing tobacco, and electronic cigarettes.  If you have any questions. Other tests that may be performed during your first trimester include:  Blood tests to find your blood type and to check for the presence of any previous infections. They will also be used to check for low iron levels (anemia) and Rh antibodies. Later in the pregnancy, blood tests for diabetes will be done along with other tests if problems develop.  Urine tests to check for infections, diabetes, or protein in the urine.  An ultrasound to confirm the proper growth  and development of the baby.  An amniocentesis to check for possible genetic problems.  Fetal screens for spina bifida and Down syndrome.  You may need other tests to make sure you and the baby are doing well.  HIV (human immunodeficiency virus) testing. Routine prenatal testing includes screening for HIV, unless you choose not to have this test. HOME CARE INSTRUCTIONS  Medicines  Follow your health care provider's instructions regarding medicine use. Specific medicines may be either safe or unsafe to take during pregnancy.  Take your prenatal vitamins as directed.  If you develop constipation, try taking a stool softener if your health care provider approves. Diet  Eat regular, well-balanced meals. Choose a variety of foods, such as meat or vegetable-based protein, fish, milk and low-fat dairy products, vegetables, fruits, and whole grain breads and cereals. Your health care provider will help you determine the amount of weight gain that is right for you.  Avoid raw meat and uncooked cheese. These carry germs that can cause birth defects in the baby.  Eating four or five small meals rather than three large meals a day may help relieve nausea and vomiting. If you start to feel nauseous, eating a few soda crackers can be helpful. Drinking liquids between meals instead of during meals also seems to help nausea and vomiting.  If you develop constipation, eat more high-fiber foods, such as fresh vegetables or fruit and whole grains. Drink enough fluids to keep your urine clear or pale yellow. Activity and Exercise  Exercise only as directed by your health care provider. Exercising will help you:  Control your weight.  Stay in shape.  Be prepared for labor and delivery.  Experiencing pain or cramping in the lower abdomen or low back is a good sign that you should stop exercising. Check with your health care provider before continuing normal exercises.  Try to avoid standing for long  periods of time. Move your legs often if you must stand in one place for a long time.  Avoid heavy lifting.  Wear low-heeled shoes, and practice good posture.  You may continue to have sex unless your health care provider directs you otherwise. Relief of Pain or Discomfort  Wear a good support bra for breast tenderness.   Take warm sitz baths to soothe any pain or discomfort caused by hemorrhoids. Use hemorrhoid cream if your health care provider approves.   Rest with your legs elevated if you have leg cramps or low back pain.  If you develop varicose veins in your legs, wear support hose. Elevate your feet for 15 minutes, 3-4 times a day. Limit salt in your diet. Prenatal Care  Schedule your prenatal visits by the twelfth week of pregnancy. They are usually scheduled monthly at first, then more often in the last 2 months before delivery.  Write down your questions. Take them to your prenatal visits.  Keep all your prenatal visits as directed by your   health care provider. Safety  Wear your seat belt at all times when driving.  Make a list of emergency phone numbers, including numbers for family, friends, the hospital, and police and fire departments. General Tips  Ask your health care provider for a referral to a local prenatal education class. Begin classes no later than at the beginning of month 6 of your pregnancy.  Ask for help if you have counseling or nutritional needs during pregnancy. Your health care provider can offer advice or refer you to specialists for help with various needs.  Do not use hot tubs, steam rooms, or saunas.  Do not douche or use tampons or scented sanitary pads.  Do not cross your legs for long periods of time.  Avoid cat litter boxes and soil used by cats. These carry germs that can cause birth defects in the baby and possibly loss of the fetus by miscarriage or stillbirth.  Avoid all smoking, herbs, alcohol, and medicines not prescribed by  your health care provider. Chemicals in these affect the formation and growth of the baby.  Do not use any tobacco products, including cigarettes, chewing tobacco, and electronic cigarettes. If you need help quitting, ask your health care provider. You may receive counseling support and other resources to help you quit.  Schedule a dentist appointment. At home, brush your teeth with a soft toothbrush and be gentle when you floss. SEEK MEDICAL CARE IF:   You have dizziness.  You have mild pelvic cramps, pelvic pressure, or nagging pain in the abdominal area.  You have persistent nausea, vomiting, or diarrhea.  You have a bad smelling vaginal discharge.  You have pain with urination.  You notice increased swelling in your face, hands, legs, or ankles. SEEK IMMEDIATE MEDICAL CARE IF:   You have a fever.  You are leaking fluid from your vagina.  You have spotting or bleeding from your vagina.  You have severe abdominal cramping or pain.  You have rapid weight gain or loss.  You vomit blood or material that looks like coffee grounds.  You are exposed to German measles and have never had them.  You are exposed to fifth disease or chickenpox.  You develop a severe headache.  You have shortness of breath.  You have any kind of trauma, such as from a fall or a car accident.   This information is not intended to replace advice given to you by your health care provider. Make sure you discuss any questions you have with your health care provider.   Document Released: 04/04/2001 Document Revised: 05/01/2014 Document Reviewed: 02/18/2013 Elsevier Interactive Patient Education 2016 Elsevier Inc[employer]  

## 2015-08-17 NOTE — Progress Notes (Signed)
New ob packet given  Early glucola given due BMI > 30 First Trimester Screen scheduled for May 11th @ 1015.  Pt notified.  Declined flu vaccine

## 2015-08-18 LAB — PRENATAL PROFILE (SOLSTAS)
ANTIBODY SCREEN: NEGATIVE
BASOS PCT: 0 %
Basophils Absolute: 0 cells/uL (ref 0–200)
EOS ABS: 75 {cells}/uL (ref 15–500)
Eosinophils Relative: 1 %
HEMATOCRIT: 31.8 % — AB (ref 35.0–45.0)
HIV 1&2 Ab, 4th Generation: NONREACTIVE
Hemoglobin: 10.6 g/dL — ABNORMAL LOW (ref 11.7–15.5)
Hepatitis B Surface Ag: NEGATIVE
LYMPHS PCT: 18 %
Lymphs Abs: 1350 cells/uL (ref 850–3900)
MCH: 29.3 pg (ref 27.0–33.0)
MCHC: 33.3 g/dL (ref 32.0–36.0)
MCV: 87.8 fL (ref 80.0–100.0)
MONO ABS: 375 {cells}/uL (ref 200–950)
MONOS PCT: 5 %
MPV: 10.2 fL (ref 7.5–12.5)
NEUTROS PCT: 76 %
Neutro Abs: 5700 cells/uL (ref 1500–7800)
Platelets: 252 10*3/uL (ref 140–400)
RBC: 3.62 MIL/uL — AB (ref 3.80–5.10)
RDW: 15.3 % — ABNORMAL HIGH (ref 11.0–15.0)
RH TYPE: POSITIVE
Rubella: 4.8 Index — ABNORMAL HIGH (ref <0.90)
WBC: 7.5 10*3/uL (ref 3.8–10.8)

## 2015-08-18 LAB — GLUCOSE TOLERANCE, 1 HOUR (50G) W/O FASTING: GLUCOSE, 1 HR, GESTATIONAL: 159 mg/dL — AB (ref <140)

## 2015-08-19 LAB — PRESCRIPTION MONITORING PROFILE (19 PANEL)
Amphetamine/Meth: NEGATIVE ng/mL
BENZODIAZEPINE SCREEN, URINE: NEGATIVE ng/mL
Barbiturate Screen, Urine: NEGATIVE ng/mL
Buprenorphine, Urine: NEGATIVE ng/mL
COCAINE METABOLITES: NEGATIVE ng/mL
CREATININE, URINE: 123.35 mg/dL (ref >20.0)
Cannabinoid Scrn, Ur: NEGATIVE ng/mL
Carisoprodol, Urine: NEGATIVE ng/mL
ECSTASY: NEGATIVE ng/mL
FENTANYL URINE: NEGATIVE ng/mL
MEPERIDINE UR: NEGATIVE ng/mL
METHADONE SCREEN, URINE: NEGATIVE ng/mL
Methaqualone: NEGATIVE ng/mL
Nitrites, Initial: NEGATIVE ug/mL
OXYCODONE SCRN UR: NEGATIVE ng/mL
Opiate Screen, Urine: NEGATIVE ng/mL
PH URINE, INITIAL: 7.6 pH (ref 4.5–8.9)
PROPOXYPHENE: NEGATIVE ng/mL
Phencyclidine, Ur: NEGATIVE ng/mL
TRAMADOL UR: NEGATIVE ng/mL
Tapentadol, urine: NEGATIVE ng/mL
ZOLPIDEM, URINE: NEGATIVE ng/mL

## 2015-08-19 LAB — HEMOGLOBINOPATHY EVALUATION
HEMOGLOBIN OTHER: 0 %
HGB F QUANT: 0 % (ref 0.0–2.0)
HGB S QUANTITAION: 0 %
Hgb A2 Quant: 2.9 % (ref 2.2–3.2)
Hgb A: 97.1 % (ref 96.8–97.8)

## 2015-08-19 LAB — CYTOLOGY - PAP

## 2015-08-20 ENCOUNTER — Encounter (HOSPITAL_COMMUNITY): Payer: Self-pay | Admitting: Obstetrics and Gynecology

## 2015-08-21 ENCOUNTER — Encounter (HOSPITAL_COMMUNITY): Payer: Self-pay

## 2015-08-21 ENCOUNTER — Inpatient Hospital Stay (HOSPITAL_COMMUNITY)
Admission: AD | Admit: 2015-08-21 | Discharge: 2015-08-21 | Disposition: A | Discharge status: Home / Self Care | Payer: BLUE CROSS/BLUE SHIELD | Source: Ambulatory Visit | Attending: Obstetrics and Gynecology | Admitting: Obstetrics and Gynecology

## 2015-08-21 DIAGNOSIS — O30041 Twin pregnancy, dichorionic/diamniotic, first trimester: Secondary | ICD-10-CM | POA: Insufficient documentation

## 2015-08-21 DIAGNOSIS — G43909 Migraine, unspecified, not intractable, without status migrainosus: Secondary | ICD-10-CM | POA: Diagnosis not present

## 2015-08-21 DIAGNOSIS — O26891 Other specified pregnancy related conditions, first trimester: Secondary | ICD-10-CM | POA: Insufficient documentation

## 2015-08-21 DIAGNOSIS — O9989 Other specified diseases and conditions complicating pregnancy, childbirth and the puerperium: Secondary | ICD-10-CM | POA: Diagnosis not present

## 2015-08-21 DIAGNOSIS — Z3A12 12 weeks gestation of pregnancy: Secondary | ICD-10-CM | POA: Diagnosis not present

## 2015-08-21 DIAGNOSIS — G43809 Other migraine, not intractable, without status migrainosus: Secondary | ICD-10-CM

## 2015-08-21 LAB — URINALYSIS, ROUTINE W REFLEX MICROSCOPIC
Bilirubin Urine: NEGATIVE
Glucose, UA: NEGATIVE mg/dL
HGB URINE DIPSTICK: NEGATIVE
Ketones, ur: NEGATIVE mg/dL
Leukocytes, UA: NEGATIVE
Nitrite: NEGATIVE
PH: 5.5 (ref 5.0–8.0)
Protein, ur: NEGATIVE mg/dL
Specific Gravity, Urine: 1.015 (ref 1.005–1.030)

## 2015-08-21 LAB — WET PREP, GENITAL
Clue Cells Wet Prep HPF POC: NONE SEEN
Sperm: NONE SEEN
Trich, Wet Prep: NONE SEEN
Yeast Wet Prep HPF POC: NONE SEEN

## 2015-08-21 MED ORDER — TRAMADOL HCL 50 MG PO TABS
100.0000 mg | ORAL_TABLET | Freq: Once | ORAL | Status: AC
  Administered 2015-08-21: 100 mg via ORAL
  Filled 2015-08-21: qty 2

## 2015-08-21 MED ORDER — BUTALBITAL-APAP-CAFFEINE 50-325-40 MG PO TABS: 1.0000 | ORAL_TABLET | ORAL | Status: DC | PRN

## 2015-08-21 MED ORDER — PROMETHAZINE HCL 25 MG PO TABS
25.0000 mg | ORAL_TABLET | Freq: Once | ORAL | Status: AC
  Administered 2015-08-21: 25 mg via ORAL
  Filled 2015-08-21: qty 1

## 2015-08-21 NOTE — Discharge Instructions (Signed)
For Headaches:   Stay well hydrated, drink enough water so that your urine is clear, sometimes if you are dehydrated you can get headaches  Eat small frequent meals and snacks, sometimes if you are hungry you can get headaches  Sometimes you get headaches during pregnancy from the pregnancy hormones  You can try tylenol (1-2 regular strength 368m or 1-2 extra strength 5069m as directed on the box. The least amount of medication that works is best. Do not take more than 4,00078mf tylenol in a 24 hour period. Your fioricet also has tylenol in it.   Cool compresses (cool wet washcloth or ice pack) to area of head that is hurting  You can also try drinking a caffeinated drink to see if this will help  For Prevention of Headaches/Migraines:  CoQ10 100m32mree times daily  Vitamin B2 400mg84mly  Magnesium Oxide 400-600mg 34my  If You Get a Bad Headache/Migraine:  Benadryl 25mg  37mnesium Oxide  1 large Gatorade  2 extra strength Tylenol (1,000mg to30m- do not take if you have taken fioricet  1 cup coffee or Coke

## 2015-08-21 NOTE — MAU Provider Note (Signed)
History  Chief Complaint:  Headache  Carla Henderson is a 25 y.o. G2P0010 female at 62w0dpresenting w/ report of headache since 1600 yesterday.  Constant throbbing pain behind Rt eye, +photophobia, +phonophobia, some nausea, no vomiting. Has been taking 2 extra strength apap q 8hrs w/o relief- last taken @ 1800 tonight. Rates 9/10. Has been able to continue daily activities. Denies h/o migraines, states she had a headache like this ~4wks ago, eventually went away on its own. Some n/v of pregnancy, not taking any antiemetics at home- doesn't feel like she needs any. Denies weight loss, is gaining, and able to keep most foods/fluids down.  Also reports increased vag d/c, denies itching/odor/irritation.  Reports no fetal movement, contractions: none, vaginal bleeding: none, membranes: intact. Denies uti s/s.   [facility]  [facility] visit [facility]/11. Pregnancy complicated by twin gestation.  Obstetrical History: OB History    Gravida Para Term Preterm AB TAB SAB Ectopic Multiple Living   2    1  1    0      Past Medical History: Past Medical History  Diagnosis Date  . UTI (lower urinary tract infection)   . Abnormal Pap smear   . Anemia   . PCOS (polycystic ovarian syndrome)   . Bronchitis   . Ovarian cyst   . Infertility, female     Past Surgical History: Past Surgical History  Procedure Laterality Date  . Wisdom tooth extraction    . Laparoscopic ovarian cystectomy Left 01/07/2015    Procedure: Attempted LAPAROSCOPIC OVARIAN CYSTECTOMY, Mini laparotomy with excision of left ovarian cyst;  Surgeon: PMora Bellman MD;  [facility];  Service: Gynecology;  Laterality: Left;  Requested 01-26-15 @ 3:30p    Social History: Social History   Social History  . Marital Status: Single    Spouse Name: N/A  . Number of Children: N/A  . Years of Education: N/A   Social History Main Topics  . Smoking status: Never Smoker   . Smokeless tobacco: Never Used  . Alcohol Use: No   Comment: occassional   . Drug Use: No  . Sexual Activity: Yes    Birth Control/ Protection: None   Other Topics Concern  . None   Social History Narrative    Allergies: Allergies  Allergen Reactions  . Vicodin [Hydrocodone-Acetaminophen] Nausea And Vomiting    Pt states does not cause swelling    Prescriptions prior to admission  Medication Sig Dispense Refill Last Dose  . Prenat w/o A Vit-FeFum-FePo-FA (CONCEPT OB) 130-92.4-1 MG CAPS Take 1 capsule by mouth daily. 30 capsule 11 08/21/2015 at Unknown time  . Prenatal Vit-Fe Fumarate-FA (PRENATAL MULTIVITAMIN) TABS tablet Take 1 tablet by mouth daily at 12 noon.   Taking    Review of Systems  Pertinent pos/neg as indicated in HPI  Physical Exam  Blood pressure 122/80, pulse 85, temperature 97.9 F (36.6 C), temperature source Oral, resp. rate 16, last menstrual period 05/21/2015, SpO2 100 %. General appearance: alert, cooperative and no distress Lungs: clear to auscultation bilaterally, normal effort Heart: regular rate and rhythm Abdomen: gravid, soft, non-tender  Spec exam: cx visually closed, mod amt thin yellowish-white nonodorous d/c Cultures/Specimens: wet prep    Fetal monitoring: RN only able to obtain 1 FHR via doppler Informal bs u/s reveals both fetuses active w/ good FHR  MAU Course  Exam Spec exam w/ wet prep Phenergan po Tramadol po  2210: HA down to 7/10, vomited about 139ms ago, recommended IVF and migraine protocol-  pt declines, states she is beginning to feel better, just wants to go home and get in bed- requests rx to have at home in case migraines continue. Declines antiemetic.   Labs:  Results for orders placed or performed during the hospital encounter of 08/21/15 (from the past 24 hour(s))  Urinalysis, Routine w reflex microscopic (not at [facility])     Status: None   Collection Time: 08/21/15  8:20 PM  Result Value Ref Range   Color, Urine YELLOW YELLOW   APPearance CLEAR CLEAR   Specific  Gravity, Urine 1.015 1.005 - 1.030   pH 5.5 5.0 - 8.0   Glucose, UA NEGATIVE NEGATIVE mg/dL   Hgb urine dipstick NEGATIVE NEGATIVE   Bilirubin Urine NEGATIVE NEGATIVE   Ketones, ur NEGATIVE NEGATIVE mg/dL   Protein, ur NEGATIVE NEGATIVE mg/dL   Nitrite NEGATIVE NEGATIVE   Leukocytes, UA NEGATIVE NEGATIVE  Wet prep, genital     Status: Abnormal   Collection Time: 08/21/15  9:14 PM  Result Value Ref Range   Yeast Wet Prep HPF POC NONE SEEN NONE SEEN   Trich, Wet Prep NONE SEEN NONE SEEN   Clue Cells Wet Prep HPF POC NONE SEEN NONE SEEN   WBC, Wet Prep HPF POC MODERATE (A) NONE SEEN   Sperm NONE SEEN    Assessment and Plan  A:  51w0dSIUP  G2P0010  Migraine  Di-Di Twins P:  D/C home  Rx fioricet #20  Gave printed info on headache prevention and relief, fioricet only if needed  [facility]   BTawnya CrookCNM,WHNP-BC 4/29/201710:23 PM

## 2015-08-21 NOTE — MAU Note (Signed)
Headache started yesterday at 4 pm.  Very nauseated.  Tried tylenol and didn't help.  No bleeding. Increased discharge watery.

## 2015-08-23 ENCOUNTER — Telehealth: Payer: Self-pay

## 2015-08-23 NOTE — Telephone Encounter (Signed)
Please check 3 hr GTT before next visit  I have left patient a message to call us back so that we can schedule her 3 hour gtt

## 2015-08-24 ENCOUNTER — Encounter: Payer: Self-pay | Admitting: Family Medicine

## 2015-08-24 DIAGNOSIS — O30049 Twin pregnancy, dichorionic/diamniotic, unspecified trimester: Secondary | ICD-10-CM | POA: Insufficient documentation

## 2015-08-26 ENCOUNTER — Other Ambulatory Visit: Payer: BLUE CROSS/BLUE SHIELD

## 2015-08-26 DIAGNOSIS — R7309 Other abnormal glucose: Secondary | ICD-10-CM

## 2015-08-26 DIAGNOSIS — O99810 Abnormal glucose complicating pregnancy: Secondary | ICD-10-CM

## 2015-08-26 DIAGNOSIS — O0991 Supervision of high risk pregnancy, unspecified, first trimester: Secondary | ICD-10-CM

## 2015-08-27 LAB — GLUCOSE TOLERANCE, 3 HOURS
GLUCOSE, 1 HOUR-GESTATIONAL: 154 mg/dL (ref <190)
GLUCOSE, FASTING-GESTATIONAL: 95 mg/dL (ref 65–104)
Glucose Tolerance, 2 hour: 144 mg/dL (ref <165)
Glucose, GTT - 3 Hour: 170 mg/dL — ABNORMAL HIGH (ref <145)

## 2015-08-27 NOTE — Telephone Encounter (Signed)
Pt was seen on 08/26/2015

## 2015-08-29 DIAGNOSIS — O99810 Abnormal glucose complicating pregnancy: Secondary | ICD-10-CM | POA: Insufficient documentation

## 2015-08-30 ENCOUNTER — Telehealth: Payer: Self-pay

## 2015-08-30 NOTE — Telephone Encounter (Signed)
I have attempted to reach patient but there was no answer or voicemail to leave message. Please have her come in asap for DM counseling. Abnormal early GTT and her appointment isn't til the 25th.

## 2015-09-02 ENCOUNTER — Encounter (HOSPITAL_COMMUNITY): Payer: Self-pay

## 2015-09-02 ENCOUNTER — Ambulatory Visit (HOSPITAL_COMMUNITY)
Admission: RE | Admit: 2015-09-02 | Discharge: 2015-09-02 | Disposition: A | Discharge status: Home / Self Care | Payer: BLUE CROSS/BLUE SHIELD | Source: Ambulatory Visit | Attending: Obstetrics and Gynecology | Admitting: Obstetrics and Gynecology

## 2015-09-02 ENCOUNTER — Ambulatory Visit (HOSPITAL_COMMUNITY): Admission: RE | Admit: 2015-09-02 | Payer: BLUE CROSS/BLUE SHIELD | Source: Ambulatory Visit

## 2015-09-02 VITALS — BP 131/77 | HR 105 | Wt 230.2 lb

## 2015-09-02 DIAGNOSIS — O99211 Obesity complicating pregnancy, first trimester: Secondary | ICD-10-CM | POA: Diagnosis not present

## 2015-09-02 DIAGNOSIS — Z36 Encounter for antenatal screening of mother: Secondary | ICD-10-CM | POA: Insufficient documentation

## 2015-09-02 DIAGNOSIS — Z3A13 13 weeks gestation of pregnancy: Secondary | ICD-10-CM | POA: Insufficient documentation

## 2015-09-02 DIAGNOSIS — O30049 Twin pregnancy, dichorionic/diamniotic, unspecified trimester: Secondary | ICD-10-CM

## 2015-09-02 DIAGNOSIS — O0991 Supervision of high risk pregnancy, unspecified, first trimester: Secondary | ICD-10-CM

## 2015-09-02 DIAGNOSIS — O30041 Twin pregnancy, dichorionic/diamniotic, first trimester: Secondary | ICD-10-CM | POA: Insufficient documentation

## 2015-09-03 ENCOUNTER — Other Ambulatory Visit: Payer: Self-pay | Admitting: Obstetrics and Gynecology

## 2015-09-03 DIAGNOSIS — O0991 Supervision of high risk pregnancy, unspecified, first trimester: Secondary | ICD-10-CM

## 2015-09-06 NOTE — Telephone Encounter (Signed)
I attempted to call patient no voicemail to leave a message.

## 2015-09-13 ENCOUNTER — Ambulatory Visit (INDEPENDENT_AMBULATORY_CARE_PROVIDER_SITE_OTHER): Payer: Medicaid Other | Admitting: Obstetrics and Gynecology

## 2015-09-13 ENCOUNTER — Encounter: Payer: BLUE CROSS/BLUE SHIELD | Attending: Obstetrics and Gynecology | Admitting: Dietician

## 2015-09-13 ENCOUNTER — Ambulatory Visit: Payer: BLUE CROSS/BLUE SHIELD

## 2015-09-13 ENCOUNTER — Encounter: Payer: Self-pay | Admitting: Obstetrics and Gynecology

## 2015-09-13 VITALS — BP 138/83 | HR 120 | Wt 228.8 lb

## 2015-09-13 DIAGNOSIS — O0992 Supervision of high risk pregnancy, unspecified, second trimester: Secondary | ICD-10-CM

## 2015-09-13 DIAGNOSIS — Z029 Encounter for administrative examinations, unspecified: Secondary | ICD-10-CM | POA: Diagnosis not present

## 2015-09-13 DIAGNOSIS — O30049 Twin pregnancy, dichorionic/diamniotic, unspecified trimester: Secondary | ICD-10-CM

## 2015-09-13 DIAGNOSIS — E669 Obesity, unspecified: Secondary | ICD-10-CM | POA: Diagnosis not present

## 2015-09-13 DIAGNOSIS — O30042 Twin pregnancy, dichorionic/diamniotic, second trimester: Secondary | ICD-10-CM

## 2015-09-13 DIAGNOSIS — O99212 Obesity complicating pregnancy, second trimester: Secondary | ICD-10-CM | POA: Diagnosis not present

## 2015-09-13 DIAGNOSIS — O99810 Abnormal glucose complicating pregnancy: Secondary | ICD-10-CM | POA: Diagnosis not present

## 2015-09-13 DIAGNOSIS — E663 Overweight: Secondary | ICD-10-CM

## 2015-09-13 LAB — POCT URINALYSIS DIP (DEVICE)
BILIRUBIN URINE: NEGATIVE
GLUCOSE, UA: NEGATIVE mg/dL
Hgb urine dipstick: NEGATIVE
KETONES UR: NEGATIVE mg/dL
Leukocytes, UA: NEGATIVE
Nitrite: NEGATIVE
Protein, ur: NEGATIVE mg/dL
SPECIFIC GRAVITY, URINE: 1.02 (ref 1.005–1.030)
Urobilinogen, UA: 0.2 mg/dL (ref 0.0–1.0)
pH: 6 (ref 5.0–8.0)

## 2015-09-13 MED ORDER — GLUCOSE BLOOD VI STRP: ORAL_STRIP | Status: DC

## 2015-09-13 MED ORDER — ACCU-CHEK FASTCLIX LANCETS MISC: 1.0000 [IU] | Freq: Four times a day (QID) | Status: DC

## 2015-09-13 MED ORDER — ACCU-CHEK NANO SMARTVIEW W/DEVICE KIT: 1.0000 | PACK | Freq: Four times a day (QID) | Status: DC

## 2015-09-13 NOTE — Progress Notes (Signed)
Diabetes Education: 09/13/15: First pregnancy, expecting twins currently at 20 weeks.  + family hx for type 2 DM.  [place] [employer] is primary insurance. General review of GDM.  Review of need for daily walking/activity to help with decreasing blood glucose. Review of the meter and schedule for monitoring blood glucose. Instructed to monitor fasting and 2 hr post meal blood glucose. Review of s/s and RX of hypoglycemia. Brief review of the restricted CHO diet for GDM. Provided handout "Nutrition, Diabetes & Pregnancy.   Physician has sent in prescription for meter, strips and lancets.  To pick up at the [facility] on [address]. Maggie Dossie Swor, RN, RD

## 2015-09-13 NOTE — Progress Notes (Signed)
Subjective:  Carla Henderson is a 25 y.o. G2P0010 at 23w3dbeing seen today for ongoing prenatal care.  She is currently monitored for the following issues for this high-risk pregnancy and has Supervision of high risk pregnancy, antepartum; Overweight; Dichorionic diamniotic twin pregnancy, antepartum; and Pregnancy with abnormal glucose tolerance test (GTT) on her problem list.  Patient reports no complaints.  Contractions: Not present. Vag. Bleeding: None.   . Denies leaking of fluid.   The following portions of the patient's history were reviewed and updated as appropriate: allergies, current medications, past family history, past medical history, past social history, past surgical history and problem list. Problem list updated.  Objective:   Filed Vitals:   09/13/15 0829  BP: 138/83  Pulse: 120  Weight: 228 lb 12.8 oz (103.783 kg)    Fetal Status: Fetal Heart Rate (bpm): 157/150         General:  Alert, oriented and cooperative. Patient is in no acute distress.  Skin: Skin is warm and dry. No rash noted.   Cardiovascular: Normal heart rate noted  Respiratory: Normal respiratory effort, no problems with respiration noted  Abdomen: Soft, gravid, appropriate for gestational age. Pain/Pressure: Absent     Pelvic: Vag. Bleeding: None     Cervical exam deferred        Extremities: Normal range of motion.  Edema: Trace  Mental Status: Normal mood and affect. Normal behavior. Normal judgment and thought content.   Urinalysis: Urine Protein: Negative Urine Glucose: Negative  Assessment and Plan:  Pregnancy: G2P0010 at 12w3d1. Supervision of high risk pregnancy, antepartum, second trimester Patient is doing well. Unable to perform first trimester screen. Quad screen today - AFP, Quad Screen  2. Overweight   3. Dichorionic diamniotic twin pregnancy, antepartum Anatomy ultrasound scheduled - AFP, Quad Screen  4. Pregnancy with abnormal glucose tolerance test (GTT) Patient to  meet with diabetic educator today Meter and supplies prescribed  General obstetric precautions including but not limited to vaginal bleeding, contractions, leaking of fluid and fetal movement were reviewed in detail with the patient. Please refer to After Visit Summary for other counseling recommendations.  Return in about 2 weeks (around 09/27/2015).   PeMora BellmanMD

## 2015-09-13 NOTE — Progress Notes (Signed)
Quad Screen today  Educated pt on Skin to Skin

## 2015-09-13 NOTE — Progress Notes (Signed)
Home Medicaid Form Completed

## 2015-09-14 LAB — AFP, QUAD SCREEN
AFP: 49.6 ng/mL
Curr Gest Age: 15.4 weeks
HCG TOTAL: 37.84 [IU]/mL
INH: 173.9 pg/mL
Interpretation-AFP: NEGATIVE
MOM FOR AFP: 2.23
MOM FOR INH: 1.21
MoM for hCG: 1.04
OPEN SPINA BIFIDA: NEGATIVE
Osb Risk: 1:1100 {titer}
UE3 MOM: 2.26
uE3 Value: 1.46 ng/mL

## 2015-09-16 ENCOUNTER — Encounter: Payer: BLUE CROSS/BLUE SHIELD | Admitting: Obstetrics and Gynecology

## 2015-09-27 ENCOUNTER — Ambulatory Visit (INDEPENDENT_AMBULATORY_CARE_PROVIDER_SITE_OTHER): Payer: Medicaid Other | Admitting: Obstetrics and Gynecology

## 2015-09-27 ENCOUNTER — Encounter: Payer: Self-pay | Admitting: Obstetrics and Gynecology

## 2015-09-27 VITALS — BP 123/70 | HR 98 | Wt 226.0 lb

## 2015-09-27 DIAGNOSIS — O24410 Gestational diabetes mellitus in pregnancy, diet controlled: Secondary | ICD-10-CM

## 2015-09-27 DIAGNOSIS — O99810 Abnormal glucose complicating pregnancy: Secondary | ICD-10-CM

## 2015-09-27 DIAGNOSIS — O24419 Gestational diabetes mellitus in pregnancy, unspecified control: Secondary | ICD-10-CM | POA: Insufficient documentation

## 2015-09-27 DIAGNOSIS — O30042 Twin pregnancy, dichorionic/diamniotic, second trimester: Secondary | ICD-10-CM | POA: Diagnosis not present

## 2015-09-27 DIAGNOSIS — E669 Obesity, unspecified: Secondary | ICD-10-CM

## 2015-09-27 DIAGNOSIS — O99212 Obesity complicating pregnancy, second trimester: Secondary | ICD-10-CM | POA: Diagnosis not present

## 2015-09-27 DIAGNOSIS — E663 Overweight: Secondary | ICD-10-CM

## 2015-09-27 DIAGNOSIS — O0992 Supervision of high risk pregnancy, unspecified, second trimester: Secondary | ICD-10-CM

## 2015-09-27 DIAGNOSIS — O30049 Twin pregnancy, dichorionic/diamniotic, unspecified trimester: Secondary | ICD-10-CM

## 2015-09-27 LAB — POCT URINALYSIS DIP (DEVICE)
Bilirubin Urine: NEGATIVE
Glucose, UA: NEGATIVE mg/dL
HGB URINE DIPSTICK: NEGATIVE
KETONES UR: NEGATIVE mg/dL
Leukocytes, UA: NEGATIVE
Nitrite: NEGATIVE
PH: 6.5 (ref 5.0–8.0)
Protein, ur: NEGATIVE mg/dL
SPECIFIC GRAVITY, URINE: 1.02 (ref 1.005–1.030)
Urobilinogen, UA: 0.2 mg/dL (ref 0.0–1.0)

## 2015-09-27 NOTE — Progress Notes (Signed)
Subjective:  Carla Henderson is a 25 y.o. G2P0010 at 14w3dbeing seen today for ongoing prenatal care.  She is currently monitored for the following issues for this high-risk pregnancy and has Supervision of high risk pregnancy, antepartum; Overweight; Dichorionic diamniotic twin pregnancy, antepartum; Pregnancy with abnormal glucose tolerance test (GTT); and GDM (gestational diabetes mellitus) on her problem list.  Patient reports no complaints. The following portions of the patient's history were reviewed and updated as appropriate: allergies, current medications, past family history, past medical history, past social history, past surgical history and problem list. Problem list updated.  Objective:   Filed Vitals:   09/27/15 0759  BP: 123/70  Pulse: 98  Weight: 226 lb (102.513 kg)    Fetal Status:           General:  Alert, oriented and cooperative. Patient is in no acute distress.  Skin: Skin is warm and dry. No rash noted.   Cardiovascular: Normal heart rate noted  Respiratory: Normal respiratory effort, no problems with respiration noted  Abdomen: Soft, gravid, appropriate for gestational age. Pain/Pressure: Absent     Pelvic:  deferred  Extremities: Normal range of motion.  Edema: Trace  Mental Status: Normal mood and affect. Normal behavior. Normal judgment and thought content.   Urinalysis:      Assessment and Plan:  Pregnancy: G2P0010 at 146w3d1. Supervision of high risk pregnancy, antepartum, second trimester Routine care. Has anatomy scan later this week  2. Dichorionic diamniotic twin pregnancy, antepartum Recommended to start [id number] of folic acid and baby ASA qday   3. Overweight Routine care  4. Pregnancy with abnormal glucose tolerance test (GTT) Normal BS log today on diet control. Can schedule fetal echo at 21-23 weeks if MFM doesn't do that after anatomy scan later this week  5. Diet controlled gestational diabetes mellitus in second trimester See  above.   Preterm labor symptoms and general obstetric precautions including but not limited to vaginal bleeding, contractions, leaking of fluid and fetal movement were reviewed in detail with the patient. Please refer to After Visit Summary for other counseling recommendations.  Return in about 2 weeks (around 10/11/2015) for HROSpencersit.   ChaAletha HalimD

## 2015-10-01 ENCOUNTER — Encounter (HOSPITAL_COMMUNITY): Payer: Self-pay | Admitting: *Deleted

## 2015-10-01 ENCOUNTER — Inpatient Hospital Stay (HOSPITAL_COMMUNITY): Payer: Medicaid Other

## 2015-10-01 ENCOUNTER — Inpatient Hospital Stay (HOSPITAL_COMMUNITY)
Admission: AD | Admit: 2015-10-01 | Discharge: 2015-10-01 | DRG: 782 | Disposition: A | Discharge status: Other Acute Inpatient Hospital | Payer: Medicaid Other | Source: Ambulatory Visit | Attending: Obstetrics & Gynecology | Admitting: Obstetrics & Gynecology

## 2015-10-01 ENCOUNTER — Ambulatory Visit (HOSPITAL_COMMUNITY): Admission: RE | Admit: 2015-10-01 | Payer: BLUE CROSS/BLUE SHIELD | Source: Ambulatory Visit

## 2015-10-01 DIAGNOSIS — O42912 Preterm premature rupture of membranes, unspecified as to length of time between rupture and onset of labor, second trimester: Secondary | ICD-10-CM | POA: Diagnosis present

## 2015-10-01 DIAGNOSIS — Z3A18 18 weeks gestation of pregnancy: Secondary | ICD-10-CM

## 2015-10-01 DIAGNOSIS — O0992 Supervision of high risk pregnancy, unspecified, second trimester: Secondary | ICD-10-CM

## 2015-10-01 DIAGNOSIS — O039 Complete or unspecified spontaneous abortion without complication: Secondary | ICD-10-CM | POA: Diagnosis present

## 2015-10-01 DIAGNOSIS — O9989 Other specified diseases and conditions complicating pregnancy, childbirth and the puerperium: Secondary | ICD-10-CM | POA: Diagnosis not present

## 2015-10-01 DIAGNOSIS — O30049 Twin pregnancy, dichorionic/diamniotic, unspecified trimester: Secondary | ICD-10-CM

## 2015-10-01 DIAGNOSIS — Z833 Family history of diabetes mellitus: Secondary | ICD-10-CM

## 2015-10-01 DIAGNOSIS — Z8249 Family history of ischemic heart disease and other diseases of the circulatory system: Secondary | ICD-10-CM

## 2015-10-01 DIAGNOSIS — R102 Pelvic and perineal pain: Secondary | ICD-10-CM | POA: Diagnosis present

## 2015-10-01 DIAGNOSIS — O30043 Twin pregnancy, dichorionic/diamniotic, third trimester: Secondary | ICD-10-CM

## 2015-10-01 DIAGNOSIS — O30042 Twin pregnancy, dichorionic/diamniotic, second trimester: Secondary | ICD-10-CM | POA: Diagnosis present

## 2015-10-01 DIAGNOSIS — O3432 Maternal care for cervical incompetence, second trimester: Principal | ICD-10-CM | POA: Diagnosis present

## 2015-10-01 DIAGNOSIS — O2690 Pregnancy related conditions, unspecified, unspecified trimester: Secondary | ICD-10-CM

## 2015-10-01 LAB — COMPREHENSIVE METABOLIC PANEL
ALBUMIN: 3 g/dL — AB (ref 3.5–5.0)
ALT: 22 U/L (ref 14–54)
AST: 17 U/L (ref 15–41)
Alkaline Phosphatase: 38 U/L (ref 38–126)
Anion gap: 7 (ref 5–15)
BILIRUBIN TOTAL: 0.3 mg/dL (ref 0.3–1.2)
BUN: 5 mg/dL — AB (ref 6–20)
CO2: 21 mmol/L — ABNORMAL LOW (ref 22–32)
Calcium: 8.9 mg/dL (ref 8.9–10.3)
Chloride: 108 mmol/L (ref 101–111)
Creatinine, Ser: 0.51 mg/dL (ref 0.44–1.00)
GFR calc Af Amer: >60 mL/min (ref >60)
Glucose, Bld: 78 mg/dL (ref 65–99)
POTASSIUM: 3.4 mmol/L — AB (ref 3.5–5.1)
Sodium: 136 mmol/L (ref 135–145)
TOTAL PROTEIN: 6.2 g/dL — AB (ref 6.5–8.1)

## 2015-10-01 LAB — URINE MICROSCOPIC-ADD ON: WBC UA: NONE SEEN WBC/hpf (ref 0–5)

## 2015-10-01 LAB — CBC
HEMATOCRIT: 30 % — AB (ref 36.0–46.0)
Hemoglobin: 10.3 g/dL — ABNORMAL LOW (ref 12.0–15.0)
MCH: 29.9 pg (ref 26.0–34.0)
MCHC: 34.3 g/dL (ref 30.0–36.0)
MCV: 87 fL (ref 78.0–100.0)
PLATELETS: 227 10*3/uL (ref 150–400)
RBC: 3.45 MIL/uL — ABNORMAL LOW (ref 3.87–5.11)
RDW: 14.3 % (ref 11.5–15.5)
WBC: 7.8 10*3/uL (ref 4.0–10.5)

## 2015-10-01 LAB — URINALYSIS, ROUTINE W REFLEX MICROSCOPIC
Bilirubin Urine: NEGATIVE
GLUCOSE, UA: NEGATIVE mg/dL
KETONES UR: 15 mg/dL — AB
LEUKOCYTES UA: NEGATIVE
NITRITE: NEGATIVE
Protein, ur: NEGATIVE mg/dL
Specific Gravity, Urine: 1.01 (ref 1.005–1.030)
pH: 6 (ref 5.0–8.0)

## 2015-10-01 LAB — TSH: TSH: 1.226 u[IU]/mL (ref 0.350–4.500)

## 2015-10-01 LAB — TYPE AND SCREEN
ABO/RH(D): A POS
Antibody Screen: NEGATIVE

## 2015-10-01 LAB — GLUCOSE, CAPILLARY
Glucose-Capillary: 78 mg/dL (ref 65–99)
Glucose-Capillary: 72 mg/dL (ref 65–99)

## 2015-10-01 LAB — RPR: RPR Ser Ql: NONREACTIVE

## 2015-10-01 MED ORDER — FENTANYL CITRATE (PF) 100 MCG/2ML IJ SOLN: 50.0000 ug | INTRAMUSCULAR | Status: DC | PRN

## 2015-10-01 MED ORDER — LACTATED RINGERS IV SOLN
INTRAVENOUS | Status: DC
  Administered 2015-10-01: 03:00:00 via INTRAVENOUS

## 2015-10-01 MED ORDER — ONDANSETRON HCL 4 MG/2ML IJ SOLN: 4.0000 mg | Freq: Four times a day (QID) | INTRAMUSCULAR | Status: DC | PRN

## 2015-10-01 MED ORDER — LACTATED RINGERS IV SOLN: 500.0000 mL | INTRAVENOUS | Status: DC | PRN

## 2015-10-01 MED ORDER — OXYTOCIN BOLUS FROM INFUSION: 500.0000 mL | INTRAVENOUS | Status: DC

## 2015-10-01 MED ORDER — ACETAMINOPHEN 325 MG PO TABS: 650.0000 mg | ORAL_TABLET | ORAL | Status: DC | PRN

## 2015-10-01 MED ORDER — OXYCODONE-ACETAMINOPHEN 5-325 MG PO TABS: 1.0000 | ORAL_TABLET | ORAL | Status: DC | PRN

## 2015-10-01 MED ORDER — OXYCODONE-ACETAMINOPHEN 5-325 MG PO TABS: 2.0000 | ORAL_TABLET | ORAL | Status: DC | PRN

## 2015-10-01 MED ORDER — OXYTOCIN 40 UNITS IN LACTATED RINGERS INFUSION - SIMPLE MED: 2.5000 [IU]/h | INTRAVENOUS | Status: DC

## 2015-10-01 MED ORDER — ASPIRIN 81 MG PO CHEW
81.0000 mg | CHEWABLE_TABLET | Freq: Every day | ORAL | Status: DC
  Filled 2015-10-01 (×2): qty 1

## 2015-10-01 MED ORDER — LIDOCAINE HCL (PF) 1 % IJ SOLN
30.0000 mL | INTRAMUSCULAR | Status: DC | PRN
Start: 2015-10-01 — End: 2015-10-01

## 2015-10-01 MED ORDER — INDOMETHACIN 50 MG PO CAPS
50.0000 mg | ORAL_CAPSULE | Freq: Once | ORAL | Status: AC
  Administered 2015-10-01: 50 mg via ORAL
  Filled 2015-10-01 (×2): qty 1

## 2015-10-01 MED ORDER — SOD CITRATE-CITRIC ACID 500-334 MG/5ML PO SOLN: 30.0000 mL | ORAL | Status: DC | PRN

## 2015-10-01 NOTE — Progress Notes (Signed)
This is a late entry note from a visit this morning.  There was some confusion about Baby A's status when I entered the room, but I was able to listen and learn that Baby A was still alive and that they were very grateful and very hopeful.  Carla Henderson has a good support system from her mother and grandmother as well as her SO and family.    She did not state any other particular needs at this time other than hope and prayer.  [place], [place] Pager [phone] 3:46 PM    10/01/15 1500  Clinical Encounter Type  Visited With Patient;Patient and family together  Visit Type Spiritual support  Referral From Nurse  Spiritual Encounters  Spiritual Needs Emotional

## 2015-10-01 NOTE — MAU Provider Note (Signed)
History     CSN: [id number]  Arrival date and time: 10/01/15 0140   First Provider Initiated Contact with Patient 10/01/15 0211      Chief Complaint  Patient presents with  . Pelvic Pain   Pelvic Pain The patient's primary symptoms include pelvic pain. This is a new problem. The current episode started today. The problem occurs intermittently. The problem has been unchanged. Pain severity now: 7/10  The problem affects both sides. She is pregnant. Associated symptoms include abdominal pain. Pertinent negatives include no chills, constipation, diarrhea, dysuria, fever, frequency, nausea, urgency or vomiting. The vaginal discharge was normal. There has been no bleeding. The symptoms are aggravated by intercourse (had intercourse just prior to cramping starting ). She has tried nothing for the symptoms. She is sexually active.    Past Medical History  Diagnosis Date  . UTI (lower urinary tract infection)   . Abnormal Pap smear   . Anemia   . PCOS (polycystic ovarian syndrome)   . Bronchitis   . Ovarian cyst   . Infertility, female     Past Surgical History  Procedure Laterality Date  . Wisdom tooth extraction    . Laparoscopic ovarian cystectomy Left 01/07/2015    Procedure: Attempted LAPAROSCOPIC OVARIAN CYSTECTOMY, Mini laparotomy with excision of left ovarian cyst;  Surgeon: Mora Bellman, MD;  Location: [facility];  Service: Gynecology;  Laterality: Left;  Requested 01-26-15 @ 3:30p    Family History  Problem Relation Age of Onset  . Other Neg Hx   . Hypertension Paternal Grandmother   . Diabetes Paternal Grandfather     Social History  Substance Use Topics  . Smoking status: Never Smoker   . Smokeless tobacco: Never Used  . Alcohol Use: No     Comment: occassional     Allergies:  Allergies  Allergen Reactions  . Vicodin [Hydrocodone-Acetaminophen] Nausea And Vomiting    Pt states does not cause swelling    Prescriptions prior to admission  Medication Sig  Dispense Refill Last Dose  . ACCU-CHEK FASTCLIX LANCETS MISC 1 Units by Does not apply route 4 (four) times daily. 102 each 3 Taking  . acetaminophen (TYLENOL) 325 MG tablet Take 650 mg by mouth every 6 (six) hours as needed.   Taking  . Blood Glucose Monitoring Suppl (ACCU-CHEK NANO SMARTVIEW) w/Device KIT 1 Device by Does not apply route 4 (four) times daily. 1 kit 0 Taking  . glucose blood (ACCU-CHEK SMARTVIEW) test strip Please use 4 times daily 51 each 12 Taking  . Prenat w/o A Vit-FeFum-FePo-FA (CONCEPT OB) 130-92.4-1 MG CAPS Take 1 capsule by mouth daily. 30 capsule 11 Taking    Review of Systems  Constitutional: Negative for fever and chills.  Gastrointestinal: Positive for abdominal pain. Negative for nausea, vomiting, diarrhea and constipation.  Genitourinary: Positive for pelvic pain. Negative for dysuria, urgency and frequency.   Physical Exam   Blood pressure 121/74, pulse 103, temperature 98.3 F (36.8 C), temperature source Oral, resp. rate 18, height 5' 7"  (1.702 m), weight 103.307 kg (227 lb 12 oz), last menstrual period 05/21/2015.  Physical Exam  Nursing note and vitals reviewed. Constitutional: She is oriented to person, place, and time. She appears well-developed and well-nourished. No distress.  HENT:  Head: Normocephalic.  Cardiovascular: Normal rate.   Respiratory: Effort normal.  GI: Soft. There is no tenderness. There is no rebound.  Genitourinary:   External: no lesion Vagina: pooling of clear fluid  Cervix: difficult to visualize, baby A presenting apparently  vertex.  Uterus: AGA   Neurological: She is alert and oriented to person, place, and time.  Skin: Skin is warm and dry.  Psychiatric: She has a normal mood and affect.   FERN: positive  MAU Course  Procedures  MDM 0215: D/W Dr. Harolyn Rutherford, will admit to labor and delivery 0225: Manus Gunning updated   Assessment and Plan  Pre-viable PROM of baby A SAB Admit to labor and delivery  Mathis Bud 10/01/2015, 2:12 AM   Attestation of Attending Supervision of Advanced Practice Provider (PA/CNM/NP): Evaluation and management procedures were performed by the Advanced Practice Provider under my supervision and collaboration.  I have reviewed the Advanced Practice Provider's note and chart, and I agree with the management and plan.  Verita Schneiders, MD, [place] Attending [place], [facility]

## 2015-10-01 NOTE — Progress Notes (Signed)
Report given to [facility] RN; [facility] staff arrived at 1405 to transport patient to [facility]. Necessary paperwork printed for transport.
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
## 2015-10-01 NOTE — Progress Notes (Signed)
Report called and given to [employer], Agricultural consultant. [facility] called for transport.

## 2015-10-01 NOTE — H&P (Signed)
Author: Tresea Mall, CNM Service: Obstetrics Author Type: Certified Nurse Midwife   Filed: 10/01/2015 2:30 AM Note Time: 10/01/2015 2:12 AM Status: In Progress   Editor: Tresea Mall, CNM (Certified Nurse Midwife) Cosign Required: Yes   Expand All Collapse All    History     CSN: [id number]  Arrival date and time: 10/01/15 0140  First Provider Initiated Contact with Patient 10/01/15 0211    Chief Complaint  Patient presents with  . Pelvic Pain   Pelvic Pain The patient's primary symptoms include pelvic pain. This is a new problem. The current episode started today. The problem occurs intermittently. The problem has been unchanged. Pain severity now: 7/10 The problem affects both sides. She is pregnant. Associated symptoms include abdominal pain. Pertinent negatives include no chills, constipation, diarrhea, dysuria, fever, frequency, nausea, urgency or vomiting. The vaginal discharge was normal. There has been no bleeding. The symptoms are aggravated by intercourse (had intercourse just prior to cramping starting ). She has tried nothing for the symptoms. She is sexually active.    Past Medical History  Diagnosis Date  . UTI (lower urinary tract infection)   . Abnormal Pap smear   . Anemia   . PCOS (polycystic ovarian syndrome)   . Bronchitis   . Ovarian cyst   . Infertility, female     Past Surgical History  Procedure Laterality Date  . Wisdom tooth extraction    . Laparoscopic ovarian cystectomy Left 01/07/2015    Procedure: Attempted LAPAROSCOPIC OVARIAN CYSTECTOMY, Mini laparotomy with excision of left ovarian cyst; Surgeon: Mora Bellman, MD; Location: [facility]; Service: Gynecology; Laterality: Left; Requested 01-26-15 @ 3:30p    Family History  Problem Relation Age of Onset  . Other Neg Hx   . Hypertension Paternal Grandmother   . Diabetes Paternal Grandfather     Social  History  Substance Use Topics  . Smoking status: Never Smoker   . Smokeless tobacco: Never Used  . Alcohol Use: No     Comment: occassional     Allergies:  Allergies  Allergen Reactions  . Vicodin [Hydrocodone-Acetaminophen] Nausea And Vomiting    Pt states does not cause swelling    Prescriptions prior to admission  Medication Sig Dispense Refill Last Dose  . ACCU-CHEK FASTCLIX LANCETS MISC 1 Units by Does not apply route 4 (four) times daily. 102 each 3 Taking  . acetaminophen (TYLENOL) 325 MG tablet Take 650 mg by mouth every 6 (six) hours as needed.   Taking  . Blood Glucose Monitoring Suppl (ACCU-CHEK NANO SMARTVIEW) w/Device KIT 1 Device by Does not apply route 4 (four) times daily. 1 kit 0 Taking  . glucose blood (ACCU-CHEK SMARTVIEW) test strip Please use 4 times daily 51 each 12 Taking  . Prenat w/o A Vit-FeFum-FePo-FA (CONCEPT OB) 130-92.4-1 MG CAPS Take 1 capsule by mouth daily. 30 capsule 11 Taking    Review of Systems  Constitutional: Negative for fever and chills.  Gastrointestinal: Positive for abdominal pain. Negative for nausea, vomiting, diarrhea and constipation.  Genitourinary: Positive for pelvic pain. Negative for dysuria, urgency and frequency.   Physical Exam   Blood pressure 121/74, pulse 103, temperature 98.3 F (36.8 C), temperature source Oral, resp. rate 18, height _0  (1.702 m), weight 103.307 kg (227 lb 12 oz), last menstrual period 05/21/2015.  Physical Exam  Nursing note and vitals reviewed. Constitutional: She is oriented to person, place, and time. She appears well-developed and well-nourished. No distress.  HENT:  Head: Normocephalic.  Cardiovascular:  Normal rate.  Respiratory: Effort normal.  GI: Soft. There is no tenderness. There is no rebound.  Genitourinary:   External: no lesion Vagina: pooling of clear fluid  Cervix: difficult to visualize, baby A presenting  apparently vertex.  Uterus: AGA  Neurological: She is alert and oriented to person, place, and time.  Skin: Skin is warm and dry.  Psychiatric: She has a normal mood and affect.   FERN: positive  MAU Course  Procedures  MDM 0215: D/W Dr. Harolyn Rutherford, will admit to labor and delivery 0225: Manus Gunning updated   Assessment and Plan  Pre-viable PROM of baby A SAB Admit to labor and delivery  Mathis Bud

## 2015-10-01 NOTE — Progress Notes (Signed)
Pt complaining of Headache. RN completed Glucose Check @ 2426 per pts request.

## 2015-10-01 NOTE — Progress Notes (Signed)
Orders requested from CNM for fetal monitoring.  CNM stated no dopplering or continuous contraction monitoring at this time.

## 2015-10-01 NOTE — Discharge Instructions (Signed)
Cervical Insufficiency Cervical insufficiency is when the cervix is weak and starts to open (dilate) and thin (efface) before the pregnancy is at term and without labor starting. This is also called incompetent cervix. It can happen in the second or third trimester when the fetus starts putting pressure on the cervix. Cervical insufficiency can lead to a miscarriage, preterm premature rupture of the membranes (PPROM), or having the baby early (preterm birth).  RISK FACTORS You may be more likely to develop cervical insufficiency if:  You have a shorter cervix than normal.  Damage or injury occurred to your cervix from a past pregnancy or surgery.  You were born with a cervical defect.  You have had a procedure done on the cervix, such as cervical biopsy.  You have a history of cervical insufficiency.  You have a history of PPROM.  You have ended several past pregnancies through abortion.  You were exposed to the drug diethylstilbestrol (DES). SYMPTOMS Often times, women do not have any symptoms. Other times, women may only have mild symptoms that often start between week 14 through 20. The symptoms may last several days or weeks. These symptoms include:  Light spotting or bleeding from the vagina.  Pelvic pressure.  A change in vaginal discharge, such as discharge that changes from clear, white, or light yellow to pink or tan.  Back pain.  Abdominal pain or cramping. DIAGNOSIS Cervical insufficiency cannot be diagnosed before you become pregnant. Once you are pregnant, your health care provider will ask about your medical history and if you have had any problems in past pregnancies. Tell your health care provider about any procedures performed on your cervix or if you have a history of miscarriages or cervical insufficiency. If your health care provider thinks you are at high risk for cervical insufficiency or show signs of cervical insufficiency, he or she may:  Perform a pelvic  exam. This will check for:  The presence of the membranes (amniotic sac) coming out of the cervix.  Cervical abnormalities.  Cervical injuries.  The presence of contractions.  Perform an ultrasonography (commonly called ultrasound) to measure the length and thickness of the cervix. TREATMENT If you have been diagnosed with cervical insufficiency, your health care provider may recommend:  Limiting physical activity.  Bed rest at home or in the hospital.  Pelvic rest, which means no sexual intercourse or placing anything in the vagina.  Cerclage to sew the cervix closed and prevent it from opening too early. The stitches (sutures) are removed between weeks 36 and 38 to avoid problems during labor. Cerclage may be recommended during pregnancy if you have had a history of miscarriages or preterm births without a known cause. It may also be recommended if you have a short cervix that was identified by ultrasound or if your health care provider has found that your cervix has dilated before 24 weeks of pregnancy. Limiting physical activity and bed rest may or may not help prevent a preterm birth. WHEN SHOULD YOU SEEK IMMEDIATE MEDICAL CARE?  Seek immediate medical care if you show any symptoms of cervical insufficiency. You will need to go to the hospital to get checked immediately.   This information is not intended to replace advice given to you by your health care provider. Make sure you discuss any questions you have with your health care provider.   Document Released: 04/10/2005 Document Revised: 05/01/2014 Document Reviewed: 06/17/2012 Elsevier Interactive Patient Education [employer].

## 2015-10-01 NOTE — Progress Notes (Signed)
Patient voicing concerns over being unaware of plan of care and unknown status of fetuses. RN met with MD to discuss plan of care; ultrasound and speculum exam completed at bedside immediately. Patient verbalizes understanding of fetal status and plan of care and has no further questions at this time. Family (FOB, pts. Mother and pts grandmother) at bedside and also verbalize understanding. Will continue to monitor patient.

## 2015-10-01 NOTE — MAU Note (Signed)
PT  SAYS SHE STARTED HAVING  LOWER    CRAMPS  AT 2300   WHILE  LAYING  SOWN.    [facility]-   CLINIC.      NO BLEEDING.          LAST SEX-   AT 1030PM  TONIGHT.

## 2015-10-11 ENCOUNTER — Encounter: Payer: Medicaid Other | Admitting: Obstetrics and Gynecology

## 2015-10-16 ENCOUNTER — Encounter: Payer: Self-pay | Admitting: *Deleted

## 2015-10-27 ENCOUNTER — Encounter: Payer: Medicaid Other | Admitting: Obstetrics and Gynecology

## 2015-10-29 ENCOUNTER — Encounter: Payer: Self-pay | Admitting: *Deleted

## 2015-11-04 ENCOUNTER — Encounter: Payer: Medicaid Other | Admitting: Advanced Practice Midwife

## 2016-02-01 ENCOUNTER — Inpatient Hospital Stay (HOSPITAL_COMMUNITY)
Admission: AD | Admit: 2016-02-01 | Discharge: 2016-02-01 | Disposition: A | Discharge status: Home / Self Care | Payer: BLUE CROSS/BLUE SHIELD | Source: Ambulatory Visit | Attending: Obstetrics and Gynecology | Admitting: Obstetrics and Gynecology

## 2016-02-01 ENCOUNTER — Inpatient Hospital Stay (HOSPITAL_COMMUNITY): Payer: BLUE CROSS/BLUE SHIELD

## 2016-02-01 ENCOUNTER — Encounter (HOSPITAL_COMMUNITY): Payer: Self-pay

## 2016-02-01 DIAGNOSIS — R102 Pelvic and perineal pain: Secondary | ICD-10-CM

## 2016-02-01 DIAGNOSIS — N912 Amenorrhea, unspecified: Secondary | ICD-10-CM | POA: Insufficient documentation

## 2016-02-01 LAB — URINALYSIS, ROUTINE W REFLEX MICROSCOPIC
Bilirubin Urine: NEGATIVE
Glucose, UA: NEGATIVE mg/dL
Hgb urine dipstick: NEGATIVE
Ketones, ur: NEGATIVE mg/dL
NITRITE: NEGATIVE
PROTEIN: NEGATIVE mg/dL
Specific Gravity, Urine: 1.02 (ref 1.005–1.030)
pH: 6 (ref 5.0–8.0)

## 2016-02-01 LAB — URINE MICROSCOPIC-ADD ON

## 2016-02-01 LAB — POCT PREGNANCY, URINE: PREG TEST UR: NEGATIVE

## 2016-02-01 LAB — WET PREP, GENITAL
Clue Cells Wet Prep HPF POC: NONE SEEN
SPERM: NONE SEEN
Trich, Wet Prep: NONE SEEN
Yeast Wet Prep HPF POC: NONE SEEN

## 2016-02-01 MED ORDER — KETOROLAC TROMETHAMINE 30 MG/ML IJ SOLN
30.0000 mg | Freq: Once | INTRAMUSCULAR | Status: AC
  Administered 2016-02-01: 30 mg via INTRAMUSCULAR
  Filled 2016-02-01: qty 1

## 2016-02-01 NOTE — MAU Provider Note (Signed)
History     CSN: [id number]  Arrival date and time: 02/01/16 3762   None     Chief Complaint  Patient presents with  . Abdominal Pain   Non-pregnant female c/o stabbing lower pelvic pain, more on left. She denies fevers. She denies urinary sx. She reports vaginal itching x1 day but denies discharge. She used Monistat-1 cream with some relief. No hx of STIs. No new partners.     OB History    Gravida Para Term Preterm AB Living   2       1 0   SAB TAB Ectopic Multiple Live Births   1              Past Medical History:  Diagnosis Date  . Abnormal Pap smear   . Anemia   . Bronchitis   . Infertility, female   . Ovarian cyst   . PCOS (polycystic ovarian syndrome)   . UTI (lower urinary tract infection)     Past Surgical History:  Procedure Laterality Date  . CERVICAL CERCLAGE    . LAPAROSCOPIC OVARIAN CYSTECTOMY Left 01/07/2015   Procedure: Attempted LAPAROSCOPIC OVARIAN CYSTECTOMY, Mini laparotomy with excision of left ovarian cyst;  Surgeon: Mora Bellman, MD;  Location: [facility];  Service: Gynecology;  Laterality: Left;  Requested 01-26-15 @ 3:30p  . WISDOM TOOTH EXTRACTION      Family History  Problem Relation Age of Onset  . Hypertension Paternal Grandmother   . Diabetes Paternal Grandfather   . Other Neg Hx     Social History  Substance Use Topics  . Smoking status: Never Smoker  . Smokeless tobacco: Never Used  . Alcohol use No     Comment: occassional     Allergies:  Allergies  Allergen Reactions  . Vicodin [Hydrocodone-Acetaminophen] Nausea And Vomiting    Pt states does not cause swelling    Prescriptions Prior to Admission  Medication Sig Dispense Refill Last Dose  . letrozole (FEMARA) 2.5 MG tablet Take 2.5 mg by mouth. Start first day of period for 5 days  1 sept 8 at Unknown time  . Prenat w/o A Vit-FeFum-FePo-FA (CONCEPT OB) 130-92.4-1 MG CAPS Take 1 capsule by mouth daily. 30 capsule 11 02/01/2016 at Unknown time    Review of Systems   Constitutional: Negative.   Gastrointestinal: Positive for abdominal pain. Negative for constipation, diarrhea, nausea and vomiting.  Genitourinary: Negative.    Physical Exam   Blood pressure 136/88, pulse 104, temperature 98.1 F (36.7 C), temperature source Oral, resp. rate 17, height 5' 9"  (1.753 m), weight 96.4 kg (212 lb 8 oz), last menstrual period 12/31/2015, SpO2 99 %, unknown if currently breastfeeding.  Physical Exam  Constitutional: She is oriented to person, place, and time. She appears well-developed and well-nourished.  HENT:  Head: Normocephalic and atraumatic.  Neck: Normal range of motion. Neck supple.  Cardiovascular: Normal rate.   Respiratory: Effort normal.  GI: Soft. She exhibits no distension and no mass. There is tenderness (LLQ, mild). There is no rebound and no guarding.  Genitourinary:  Genitourinary Comments: External: no lesions Vagina: rugated, parous, thick clumpy white discharge Uterus: non enlarged, anteverted, non tender, no CMT Adnexae: no masses, mild tenderness left, no tenderness right   Musculoskeletal: Normal range of motion.  Neurological: She is alert and oriented to person, place, and time.  Skin: Skin is warm and dry.  Psychiatric: She has a normal mood and affect.   Results for orders placed or performed during the hospital  encounter of 02/01/16 (from the past 24 hour(s))  Urinalysis, Routine w reflex microscopic (not at [facility])     Status: Abnormal   Collection Time: 02/01/16  7:00 PM  Result Value Ref Range   Color, Urine YELLOW YELLOW   APPearance CLEAR CLEAR   Specific Gravity, Urine 1.020 1.005 - 1.030   pH 6.0 5.0 - 8.0   Glucose, UA NEGATIVE NEGATIVE mg/dL   Hgb urine dipstick NEGATIVE NEGATIVE   Bilirubin Urine NEGATIVE NEGATIVE   Ketones, ur NEGATIVE NEGATIVE mg/dL   Protein, ur NEGATIVE NEGATIVE mg/dL   Nitrite NEGATIVE NEGATIVE   Leukocytes, UA MODERATE (A) NEGATIVE  Urine microscopic-add on     Status: Abnormal    Collection Time: 02/01/16  7:00 PM  Result Value Ref Range   Squamous Epithelial / LPF 0-5 (A) NONE SEEN   WBC, UA 0-5 0 - 5 WBC/hpf   RBC / HPF 0-5 0 - 5 RBC/hpf   Bacteria, UA MANY (A) NONE SEEN  Pregnancy, urine POC     Status: None   Collection Time: 02/01/16  7:16 PM  Result Value Ref Range   Preg Test, Ur NEGATIVE NEGATIVE  Wet prep, genital     Status: Abnormal   Collection Time: 02/01/16  7:55 PM  Result Value Ref Range   Yeast Wet Prep HPF POC NONE SEEN NONE SEEN   Trich, Wet Prep NONE SEEN NONE SEEN   Clue Cells Wet Prep HPF POC NONE SEEN NONE SEEN   WBC, Wet Prep HPF POC FEW (A) NONE SEEN   Sperm NONE SEEN    US Transvaginal Non-ob  Result Date: 02/01/2016 CLINICAL DATA:  Left lower quadrant abdominal and pelvic pain for 6 days. History of left ovarian cyst removal. EXAM: TRANSABDOMINAL AND TRANSVAGINAL ULTRASOUND OF PELVIS DOPPLER ULTRASOUND OF OVARIES TECHNIQUE: Both transabdominal and transvaginal ultrasound examinations of the pelvis were performed. Transabdominal technique was performed for global imaging of the pelvis including uterus, ovaries, adnexal regions, and pelvic cul-de-sac. It was necessary to proceed with endovaginal exam following the transabdominal exam to visualize the uterus, endometrium, ovaries and adnexa. Color and duplex Doppler ultrasound was utilized to evaluate blood flow to the ovaries. COMPARISON:  Pelvic ultrasound 12/03/2014 FINDINGS: Uterus Measurements: 8.6 x 4.3 x 6.7 cm. No fibroids or other mass visualized. Endometrium Thickness: 11 mm.  No focal abnormality visualized. Right ovary Measurements: 4.5 x 2.5 x 2.9 cm. Normal appearance/no adnexal mass. There is normal blood flow. Left ovary Measurements: 5.0 x 3.3 x 3.2 cm. Normal appearance/no adnexal mass. There is normal blood flow. Pulsed Doppler evaluation of both ovaries demonstrates normal low-resistance arterial and venous waveforms. Other findings No abnormal free fluid. IMPRESSION: Normal  pelvic ultrasound with Doppler.  No ovarian torsion or cyst. Electronically Signed   By: Jeb Levering M.D.   On: 02/01/2016 21:16   US Pelvis Complete  Result Date: 02/01/2016 CLINICAL DATA:  Left lower quadrant abdominal and pelvic pain for 6 days. History of left ovarian cyst removal. EXAM: TRANSABDOMINAL AND TRANSVAGINAL ULTRASOUND OF PELVIS DOPPLER ULTRASOUND OF OVARIES TECHNIQUE: Both transabdominal and transvaginal ultrasound examinations of the pelvis were performed. Transabdominal technique was performed for global imaging of the pelvis including uterus, ovaries, adnexal regions, and pelvic cul-de-sac. It was necessary to proceed with endovaginal exam following the transabdominal exam to visualize the uterus, endometrium, ovaries and adnexa. Color and duplex Doppler ultrasound was utilized to evaluate blood flow to the ovaries. COMPARISON:  Pelvic ultrasound 12/03/2014 FINDINGS: Uterus Measurements: 8.6 x 4.3  x 6.7 cm. No fibroids or other mass visualized. Endometrium Thickness: 11 mm.  No focal abnormality visualized. Right ovary Measurements: 4.5 x 2.5 x 2.9 cm. Normal appearance/no adnexal mass. There is normal blood flow. Left ovary Measurements: 5.0 x 3.3 x 3.2 cm. Normal appearance/no adnexal mass. There is normal blood flow. Pulsed Doppler evaluation of both ovaries demonstrates normal low-resistance arterial and venous waveforms. Other findings No abnormal free fluid. IMPRESSION: Normal pelvic ultrasound with Doppler.  No ovarian torsion or cyst. Electronically Signed   By: Jeb Levering M.D.   On: 02/01/2016 21:16   [place] Art/ven Flow Abd Pelv Doppler  Result Date: 02/01/2016 CLINICAL DATA:  Left lower quadrant abdominal and pelvic pain for 6 days. History of left ovarian cyst removal. EXAM: TRANSABDOMINAL AND TRANSVAGINAL ULTRASOUND OF PELVIS DOPPLER ULTRASOUND OF OVARIES TECHNIQUE: Both transabdominal and transvaginal ultrasound examinations of the pelvis were performed.  Transabdominal technique was performed for global imaging of the pelvis including uterus, ovaries, adnexal regions, and pelvic cul-de-sac. It was necessary to proceed with endovaginal exam following the transabdominal exam to visualize the uterus, endometrium, ovaries and adnexa. Color and duplex Doppler ultrasound was utilized to evaluate blood flow to the ovaries. COMPARISON:  Pelvic ultrasound 12/03/2014 FINDINGS: Uterus Measurements: 8.6 x 4.3 x 6.7 cm. No fibroids or other mass visualized. Endometrium Thickness: 11 mm.  No focal abnormality visualized. Right ovary Measurements: 4.5 x 2.5 x 2.9 cm. Normal appearance/no adnexal mass. There is normal blood flow. Left ovary Measurements: 5.0 x 3.3 x 3.2 cm. Normal appearance/no adnexal mass. There is normal blood flow. Pulsed Doppler evaluation of both ovaries demonstrates normal low-resistance arterial and venous waveforms. Other findings No abnormal free fluid. IMPRESSION: Normal pelvic ultrasound with Doppler.  No ovarian torsion or cyst. Electronically Signed   By: Jeb Levering M.D.   On: 02/01/2016 21:16    MAU Course  Procedures Toradol 30 mg IM x1  MDM Labs and [place] ordered and reviewed. No evidence of acute pelvic process or infection. Some relief after Toradol. Stable for discharge home.  Assessment and Plan   1. Acute pelvic pain, female   2. Pelvic pain in female   3. Amenorrhea    Discharge home Ibuprofen 600 mg po q6 hrs prn Follow up with Gyn Provider as needed Return for worsening sx  Julianne Handler, CNM 02/01/2016, 7:46 PM

## 2016-02-01 NOTE — MAU Note (Signed)
Pt c/o lower abdominal pain in her left side that feels crampy like a period but more intense for the last 6 days. Pt states her last period was in September. Pt denies bleeding.

## 2016-02-02 LAB — GC/CHLAMYDIA PROBE AMP (~~LOC~~) NOT AT ARMC
CHLAMYDIA, DNA PROBE: NEGATIVE
NEISSERIA GONORRHEA: NEGATIVE

## 2016-02-03 LAB — URINE CULTURE: CULTURE: NO GROWTH

## 2016-07-21 ENCOUNTER — Encounter (HOSPITAL_COMMUNITY): Payer: Self-pay

## 2016-07-21 ENCOUNTER — Inpatient Hospital Stay (HOSPITAL_COMMUNITY)
Admission: AD | Admit: 2016-07-21 | Discharge: 2016-07-21 | Disposition: A | Discharge status: Home / Self Care | Payer: BLUE CROSS/BLUE SHIELD | Source: Ambulatory Visit | Attending: Obstetrics and Gynecology | Admitting: Obstetrics and Gynecology

## 2016-07-21 ENCOUNTER — Inpatient Hospital Stay (HOSPITAL_COMMUNITY): Payer: BLUE CROSS/BLUE SHIELD

## 2016-07-21 DIAGNOSIS — N83202 Unspecified ovarian cyst, left side: Secondary | ICD-10-CM | POA: Diagnosis not present

## 2016-07-21 DIAGNOSIS — R102 Pelvic and perineal pain: Secondary | ICD-10-CM | POA: Diagnosis not present

## 2016-07-21 LAB — CBC
HCT: 35.2 % — ABNORMAL LOW (ref 36.0–46.0)
Hemoglobin: 11.8 g/dL — ABNORMAL LOW (ref 12.0–15.0)
MCH: 29.4 pg (ref 26.0–34.0)
MCHC: 33.5 g/dL (ref 30.0–36.0)
MCV: 87.8 fL (ref 78.0–100.0)
Platelets: 246 10*3/uL (ref 150–400)
RBC: 4.01 MIL/uL (ref 3.87–5.11)
RDW: 13 % (ref 11.5–15.5)
WBC: 5.1 10*3/uL (ref 4.0–10.5)

## 2016-07-21 LAB — WET PREP, GENITAL
Clue Cells Wet Prep HPF POC: NONE SEEN
Sperm: NONE SEEN
Trich, Wet Prep: NONE SEEN
Yeast Wet Prep HPF POC: NONE SEEN

## 2016-07-21 LAB — URINALYSIS, ROUTINE W REFLEX MICROSCOPIC
BILIRUBIN URINE: NEGATIVE
GLUCOSE, UA: NEGATIVE mg/dL
HGB URINE DIPSTICK: NEGATIVE
Ketones, ur: NEGATIVE mg/dL
Leukocytes, UA: NEGATIVE
Nitrite: NEGATIVE
PROTEIN: NEGATIVE mg/dL
Specific Gravity, Urine: 1.024 (ref 1.005–1.030)
pH: 7 (ref 5.0–8.0)

## 2016-07-21 LAB — POCT PREGNANCY, URINE: Preg Test, Ur: NEGATIVE

## 2016-07-21 MED ORDER — IBUPROFEN 600 MG PO TABS: 600.0000 mg | ORAL_TABLET | Freq: Four times a day (QID) | ORAL | 0 refills | Status: DC | PRN

## 2016-07-21 NOTE — MAU Note (Signed)
Pt complains of right sided abdominal pain that started 4 days ago and has been consistant since than. She describes it as sharp stabbing pain.

## 2016-07-21 NOTE — MAU Provider Note (Signed)
History       Chief Complaint  Patient presents with  . Abdominal Pain   HPI  Pt states she is on her 2nd round of clomid and has had menstrual like cramps on the right side for 4 days. Pt is actively trying to conceive. She denies any fever.    OB History    Gravida Para Term Preterm AB Living   2       2 0   SAB TAB Ectopic Multiple Live Births   2              Past Medical History:  Diagnosis Date  . Abnormal Pap smear   . Anemia   . Bronchitis   . Infertility, female   . Ovarian cyst   . PCOS (polycystic ovarian syndrome)   . UTI (lower urinary tract infection)     Past Surgical History:  Procedure Laterality Date  . CERVICAL CERCLAGE    . LAPAROSCOPIC OVARIAN CYSTECTOMY Left 01/07/2015   Procedure: Attempted LAPAROSCOPIC OVARIAN CYSTECTOMY, Mini laparotomy with excision of left ovarian cyst;  Surgeon: Mora Bellman, MD;  Location: [facility];  Service: Gynecology;  Laterality: Left;  Requested 01-26-15 @ 3:30p  . WISDOM TOOTH EXTRACTION      Family History  Problem Relation Age of Onset  . Hypertension Paternal Grandmother   . Diabetes Paternal Grandfather   . Other Neg Hx     Social History  Substance Use Topics  . Smoking status: Never Smoker  . Smokeless tobacco: Never Used  . Alcohol use No     Comment: occassional     Allergies:  Allergies  Allergen Reactions  . Vicodin [Hydrocodone-Acetaminophen] Nausea And Vomiting    Pt states does not cause swelling    Prescriptions Prior to Admission  Medication Sig Dispense Refill Last Dose  . letrozole (FEMARA) 2.5 MG tablet Take 2.5 mg by mouth. Start first day of period for 5 days  1 sept 8 at Unknown time  . Prenat w/o A Vit-FeFum-FePo-FA (CONCEPT OB) 130-92.4-1 MG CAPS Take 1 capsule by mouth daily. 30 capsule 11 02/01/2016 at Unknown time    ROS Physical Exam   Blood pressure 121/85, pulse 84, temperature 97.2 F (36.2 C), temperature source Oral, resp. rate 18, height 5' 9"  (1.753 m), weight  216 lb (98 kg), last menstrual period 06/11/2016, SpO2 100 %, not currently breastfeeding.  Results for orders placed or performed during the hospital encounter of 07/21/16 (from the past 24 hour(s))  Urinalysis, Routine w reflex microscopic     Status: Abnormal   Collection Time: 07/21/16 12:20 PM  Result Value Ref Range   Color, Urine YELLOW YELLOW   APPearance HAZY (A) CLEAR   Specific Gravity, Urine 1.024 1.005 - 1.030   pH 7.0 5.0 - 8.0   Glucose, UA NEGATIVE NEGATIVE mg/dL   Hgb urine dipstick NEGATIVE NEGATIVE   Bilirubin Urine NEGATIVE NEGATIVE   Ketones, ur NEGATIVE NEGATIVE mg/dL   Protein, ur NEGATIVE NEGATIVE mg/dL   Nitrite NEGATIVE NEGATIVE   Leukocytes, UA NEGATIVE NEGATIVE  Pregnancy, urine POC     Status: None   Collection Time: 07/21/16 12:47 PM  Result Value Ref Range   Preg Test, Ur NEGATIVE NEGATIVE  CBC     Status: Abnormal   Collection Time: 07/21/16  1:11 PM  Result Value Ref Range   WBC 5.1 4.0 - 10.5 K/uL   RBC 4.01 3.87 - 5.11 MIL/uL   Hemoglobin 11.8 (L) 12.0 - 15.0 g/dL  HCT 35.2 (L) 36.0 - 46.0 %   MCV 87.8 78.0 - 100.0 fL   MCH 29.4 26.0 - 34.0 pg   MCHC 33.5 30.0 - 36.0 g/dL   RDW 13.0 11.5 - 15.5 %   Platelets 246 150 - 400 K/uL   US Transvaginal Non-ob  Result Date: 07/21/2016 CLINICAL DATA:  Pelvic pain, right side pain for 4 days EXAM: TRANSABDOMINAL AND TRANSVAGINAL ULTRASOUND OF PELVIS TECHNIQUE: Both transabdominal and transvaginal ultrasound examinations of the pelvis were performed. Transabdominal technique was performed for global imaging of the pelvis including uterus, ovaries, adnexal regions, and pelvic cul-de-sac. It was necessary to proceed with endovaginal exam following the transabdominal exam to visualize the ovaries. COMPARISON:  None FINDINGS: Uterus Measurements: 9.73 x 4.71 x 6.36. No fibroids or other mass visualized. Endometrium Thickness:  13 mm.  No focal abnormality visualized. Right ovary Measurements: 4.34 x 2.14 x 2.96  cm. Normal appearance/no adnexal mass. Left ovary Measurements: 5.04 x 4.46 x 4.81 cm. Simple appearing cyst measures 4.34 x 3.83 x 4.19 cm. Other findings No abnormal free fluid. IMPRESSION: 1. No acute findings. 2. Left ovary cyst measuring 4 cm. This is almost certainly benign, and no specific imaging follow up is recommended according to the Society of Radiologists in [place] Statement (D Clovis Riley et al. Management of Asymptomatic Ovarian and Other Adnexal Cysts Imaged at [place]: Society of Radiologists in [place] Statement 2010. Radiology 256 (Sept 2010): 161-096.). Electronically Signed   By: Kerby Moors M.D.   On: 07/21/2016 16:21   US Pelvis Complete  Result Date: 07/21/2016 CLINICAL DATA:  Pelvic pain, right side pain for 4 days EXAM: TRANSABDOMINAL AND TRANSVAGINAL ULTRASOUND OF PELVIS TECHNIQUE: Both transabdominal and transvaginal ultrasound examinations of the pelvis were performed. Transabdominal technique was performed for global imaging of the pelvis including uterus, ovaries, adnexal regions, and pelvic cul-de-sac. It was necessary to proceed with endovaginal exam following the transabdominal exam to visualize the ovaries. COMPARISON:  None FINDINGS: Uterus Measurements: 9.73 x 4.71 x 6.36. No fibroids or other mass visualized. Endometrium Thickness:  13 mm.  No focal abnormality visualized. Right ovary Measurements: 4.34 x 2.14 x 2.96 cm. Normal appearance/no adnexal mass. Left ovary Measurements: 5.04 x 4.46 x 4.81 cm. Simple appearing cyst measures 4.34 x 3.83 x 4.19 cm. Other findings No abnormal free fluid. IMPRESSION: 1. No acute findings. 2. Left ovary cyst measuring 4 cm. This is almost certainly benign, and no specific imaging follow up is recommended according to the Society of Radiologists in [place] Statement (D Clovis Riley et al. Management of Asymptomatic Ovarian and Other Adnexal Cysts Imaged at [place]: Society of  Radiologists in [place] Statement 2010. Radiology 256 (Sept 2010): 045-409.). Electronically Signed   By: Kerby Moors M.D.   On: 07/21/2016 16:21   Physical Exam Physical Examination:  General appearance - alert, well appearing, and in no distress and oriented to person, place, and time Physical Examination: Mental status - alert, oriented to person, place, and time Chest - clear to auscultation, no wheezes, rales or rhonchi, symmetric air entry Heart - normal rate, regular rhythm, normal S1, S2, no murmurs, rubs, clicks or gallops Abdomen - tenderness noted lower right side no rebound tenderness noted bowel sounds normal no bladder distension noted Pelvic - normal external genitalia, vulva, vagina, cervix, uterus and adnexa Neurological - alert, oriented, normal speech, no focal findings or movement disorder noted Extremities - peripheral pulses normal, no pedal edema, no clubbing or  cyanosis Skin - normal coloration and turgor, no rashes, no suspicious skin lesions noted ED Course  Assessment: Pelvic Pain Left Ovarian Cyst   Plan: Pelvic Ultrasound- wnl CBC Reveiwed POC with Dr Mancel Bale and pt should follow up with Dr Alesia Richards next week,  Larey Days CNM, MSN 07/21/2016 1:47 PM

## 2016-07-21 NOTE — MAU Note (Signed)
Pt signed paper discharge papers.

## 2016-07-21 NOTE — Discharge Instructions (Signed)
Pelvic Pain, Female Pelvic pain is pain in your lower belly (abdomen), below your belly button and between your hips. The pain may start suddenly (acute), keep coming back (recurring), or last a long time (chronic). Pelvic pain that lasts longer than six months is considered chronic. There are many causes of pelvic pain. Sometimes the cause of your pelvic pain is not known. Follow these instructions at home:  Take over-the-counter and prescription medicines only as told by your doctor.  Rest as told by your doctor.  Do not have sex it if hurts.  Keep a journal of your pelvic pain. Write down:  When the pain started.  Where the pain is located.  What seems to make the pain better or worse, such as food or your menstrual cycle.  Any symptoms you have along with the pain.  Keep all follow-up visits as told by your doctor. This is important. Contact a doctor if:  Medicine does not help your pain.  Your pain comes back.  You have new symptoms.  You have unusual vaginal discharge or bleeding.  You have a fever or chills.  You are having a hard time pooping (constipation).  You have blood in your pee (urine) or poop (stool).  Your pee smells bad.  You feel weak or lightheaded. Get help right away if:  You have sudden pain that is very bad.  Your pain continues to get worse.  You have very bad pain and also have any of the following symptoms:  A fever.  Feeling stick to your stomach (nausea).  Throwing up (vomiting).  Being very sweaty.  You pass out (lose consciousness). This information is not intended to replace advice given to you by your health care provider. Make sure you discuss any questions you have with your health care provider. Document Released: 09/27/2007 Document Revised: 05/05/2015 Document Reviewed: 01/29/2015 Elsevier Interactive Patient Education  2017 [employer].

## 2016-07-24 LAB — GC/CHLAMYDIA PROBE AMP (~~LOC~~) NOT AT ARMC
Chlamydia: NEGATIVE
Neisseria Gonorrhea: NEGATIVE

## 2016-11-01 ENCOUNTER — Encounter (HOSPITAL_COMMUNITY): Payer: Self-pay | Admitting: *Deleted

## 2016-11-01 ENCOUNTER — Inpatient Hospital Stay (HOSPITAL_COMMUNITY)
Admission: AD | Admit: 2016-11-01 | Discharge: 2016-11-01 | Disposition: A | Discharge status: Home / Self Care | Payer: BLUE CROSS/BLUE SHIELD | Source: Ambulatory Visit | Attending: Obstetrics and Gynecology | Admitting: Obstetrics and Gynecology

## 2016-11-01 ENCOUNTER — Inpatient Hospital Stay (HOSPITAL_COMMUNITY): Payer: BLUE CROSS/BLUE SHIELD

## 2016-11-01 DIAGNOSIS — R1032 Left lower quadrant pain: Secondary | ICD-10-CM | POA: Insufficient documentation

## 2016-11-01 DIAGNOSIS — N83201 Unspecified ovarian cyst, right side: Secondary | ICD-10-CM | POA: Insufficient documentation

## 2016-11-01 DIAGNOSIS — N83202 Unspecified ovarian cyst, left side: Secondary | ICD-10-CM | POA: Diagnosis not present

## 2016-11-01 DIAGNOSIS — O09299 Supervision of pregnancy with other poor reproductive or obstetric history, unspecified trimester: Secondary | ICD-10-CM

## 2016-11-01 DIAGNOSIS — Z3401 Encounter for supervision of normal first pregnancy, first trimester: Secondary | ICD-10-CM | POA: Insufficient documentation

## 2016-11-01 DIAGNOSIS — Z8632 Personal history of gestational diabetes: Secondary | ICD-10-CM | POA: Diagnosis not present

## 2016-11-01 DIAGNOSIS — N979 Female infertility, unspecified: Secondary | ICD-10-CM | POA: Diagnosis present

## 2016-11-01 DIAGNOSIS — Q51810 Arcuate uterus: Secondary | ICD-10-CM

## 2016-11-01 LAB — CBC
HEMATOCRIT: 33.6 % — AB (ref 36.0–46.0)
HEMOGLOBIN: 11.6 g/dL — AB (ref 12.0–15.0)
MCH: 30.5 pg (ref 26.0–34.0)
MCHC: 34.5 g/dL (ref 30.0–36.0)
MCV: 88.4 fL (ref 78.0–100.0)
Platelets: 271 10*3/uL (ref 150–400)
RBC: 3.8 MIL/uL — ABNORMAL LOW (ref 3.87–5.11)
RDW: 13.4 % (ref 11.5–15.5)
WBC: 7.2 10*3/uL (ref 4.0–10.5)

## 2016-11-01 LAB — WET PREP, GENITAL
Clue Cells Wet Prep HPF POC: NONE SEEN
Sperm: NONE SEEN
Trich, Wet Prep: NONE SEEN
Yeast Wet Prep HPF POC: NONE SEEN

## 2016-11-01 LAB — HCG, QUANTITATIVE, PREGNANCY: hCG, Beta Chain, Quant, S: 28 m[IU]/mL — ABNORMAL HIGH (ref <5)

## 2016-11-01 NOTE — Discharge Instructions (Signed)
Human Chorionic Gonadotropin Test Human chorionic gonadotropin (hCG) is a hormone produced during pregnancy by the cells that form the placenta. The placenta is the organ that grows inside your womb (uterus) to nourish a developing baby. When you are pregnant, hCG starts to appear in your blood about 11 days after conception. It continues to go up for the first 8-11 weeks of pregnancy. Your hCG level can be measured with several different types of tests. You may have:  A urine test. ? hCG is eliminated from your body by your kidneys, so a urine test is one way to check for this hormone. ? A urine test only shows whether there is hCG in your urine. It does not measure how much. ? You may have a urine test to find out whether you are pregnant. ? A home pregnancy test detects whether there is hCG in your urine.  A qualitative blood test. ? Like the urine test, this blood test only shows whether there is hCG in your blood. It does not measure how much. ? You may have this type of blood test to find out whether you are pregnant.  A quantitative blood test. ? This type of blood test measures the amount of hCG in your blood. ? You may have this type of test to diagnose an abnormal pregnancy or determine whether you are at risk of, or have had, a failed pregnancy (miscarriage).  How do I prepare for this test? For the urine test:  Limit your fluid intake before the urine test as directed by your health care provider.  Collect the sample the first time you urinate in the morning.  Let your health care provider know if you have blood in your urine. This may interfere with the test result.  Some medicines may interfere with the urine and blood tests. Let your health care provider know about all the medicines you are taking. No additional preparation is required for the blood test. What do the results mean? It is your responsibility to obtain your test results. Ask the lab or department performing  the test when and how you will get your results. Talk to your health care provider if you have any questions about your test results. The results of the hCG urine test and the qualitative hCG blood test are either positive or negative. The results of the quantitative hCG blood test are reported as a number. hCG is measured in international units per liter (IU/L). Meaning of Negative Test Results A negative result on a urine or qualitative blood test could mean that you are not pregnant. It could also mean the test was done too early to detect hCG. If you still have other signs of pregnancy, the test should be repeated. Meaning of Positive Test Results A positive result on the urine or qualitative blood tests means you are most likely pregnant. Your health care provider may confirm your pregnancy with an imaging study of the inside of your uterus at 5-6 weeks (ultrasound). Range of Normal Values Ranges for normal values for the quantitative hCG blood test may vary among different labs and hospitals. You should always check with your health care provider after having lab work or other tests done to discuss whether your values are considered within normal limits.  Less than 5 IU/L means it is most likely you are not pregnant.  Greater than 25 IU/L means it is most likely you are pregnant.  Meaning of Results Outside Normal Value Ranges If your hCG  level on the quantitative test is not what would be expected, you may have the test again. It may also be important for your health care provider to know whether your hCG level goes up or down over time. Common causes of results outside the normal range include:  Being pregnant with twins (hCG level is higher than expected).  Having an ectopic pregnancy (hCG rises more slowly than expected).  Miscarriage (hCG level falls).  Abnormal growths in the womb (hCG level is higher than expected).  Talk with your health care provider to discuss your results,  treatment options, and if necessary, the need for more tests. Talk with your health care provider if you have any questions about your results. This information is not intended to replace advice given to you by your health care provider. Make sure you discuss any questions you have with your health care provider. Document Released: 05/12/2004 Document Revised: 12/15/2015 Document Reviewed: 07/15/2013 Elsevier Interactive Patient Education  [employer].

## 2016-11-01 NOTE — MAU Note (Signed)
Pt states she was doing infertility treatments, took clomid during her last period.  Pos HPT x 3 yesterday, one today was also positive.  LLQ pain started last night around 0100, pain is worse today.  Denies bleeding.

## 2016-11-01 NOTE — MAU Provider Note (Signed)
History   26 yo G3P0020 presented unannounced with report of LLQ abdominal pain since 1 am and positive home UPT x 4 over last 24 hours.  Describes LLQ pain as "sharp", and intermittent.  Also reports low back pain, more dull in nature.  Denies vaginal bleeding, dysuria, N/V.  Does note thin watery vaginal d/c.    Has been followed by Dr. Alesia Richards, last visit 09/26/16, with US showing slightly arcuate uterus and right ovary with 3.7 cm hemorrhagic cyst.  Reports cycles irregular, q 35-38 days.   History remarkable for female infertility, on Clomid, current dose 150 mg on day 3-7 of her cycle (last course started on 10/06/16), and Metformin 850 mg daily.  Had been on Femara at the time of a twin conception in 2017, and through 07/2016, then stopped per Dr. Fonnie Jarvis instructions.    Had loss of twin pregnancy in 2017, with PROM and delivery of Twin A at 18 weeks, with death soon after.  Transferred to [facility] for rescue cerclage with Twin A placenta in situ, but PROM and delivery of Twin B, with death soon after, at 20 weeks.  Hx of early dx of gestational diabetes in that pregnancy, around 15 weeks.  A+ type noted from previous pregnancy  Patient Active Problem List   Diagnosis Date Noted  . H/O incompetent cervix, currently pregnant--hx loss of 18-20 week twins 2017, cerclage then 11/01/2016  . Infertility, female 11/01/2016  . History of gestational diabetes 11/01/2016  . 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 Overweight 08/17/2015    Chief Complaint  Patient presents with  . Abdominal Pain   HPI:  See above  OB History    Gravida Para Term Preterm AB Living   2       2 0   SAB TAB Ectopic Multiple Live Births   2            2014--SAB 2017--Twin delivery, one at 18 weeks, one at 20 weeks, with PROM both twins  Past Medical History:  Diagnosis Date  . Abnormal Pap smear   . Anemia   . Bronchitis   . Infertility, female   . Ovarian cyst   . PCOS (polycystic ovarian syndrome)   . UTI  (lower urinary tract infection)     Past Surgical History:  Procedure Laterality Date  . CERVICAL CERCLAGE    . LAPAROSCOPIC OVARIAN CYSTECTOMY Left 01/07/2015   Procedure: Attempted LAPAROSCOPIC OVARIAN CYSTECTOMY, Mini laparotomy with excision of left ovarian cyst;  Surgeon: Mora Bellman, MD;  Location: [facility];  Service: Gynecology;  Laterality: Left;  Requested 01-26-15 @ 3:30p  . WISDOM TOOTH EXTRACTION      Family History  Problem Relation Age of Onset  . Hypertension Paternal Grandmother   . Diabetes Paternal Grandfather   . Other Neg Hx     Social History  Substance Use Topics  . Smoking status: Never Smoker  . Smokeless tobacco: Never Used  . Alcohol use No     Comment: occassional     Allergies:  Allergies  Allergen Reactions  . Vicodin [Hydrocodone-Acetaminophen] Nausea And Vomiting    Pt states does not cause swelling    Prescriptions Prior to Admission  Medication Sig Dispense Refill Last Dose  . clomiPHENE (CLOMID) 50 MG tablet Take 1 tablet by mouth daily.  0   . ibuprofen (ADVIL,MOTRIN) 600 MG tablet Take 1 tablet (600 mg total) by mouth every 6 (six) hours as needed for moderate pain or cramping. 30 tablet 0   .  Prenat w/o A Vit-FeFum-FePo-FA (CONCEPT OB) 130-92.4-1 MG CAPS Take 1 capsule by mouth daily. 30 capsule 11 07/21/2016 at Unknown time    ROS:  LLQ pain, mild low back pain Physical Exam   Blood pressure 126/82, pulse 94, temperature 98.3 F (36.8 C), temperature source Oral, resp. rate 18, height 5' 9"  (1.753 m), weight 98.9 kg (218 lb), last menstrual period 10/04/2016.  Results for orders placed or performed during the hospital encounter of 11/01/16 (from the past 24 hour(s))  hCG, quantitative, pregnancy     Status: Abnormal   Collection Time: 11/01/16  5:56 PM  Result Value Ref Range   hCG, Beta Chain, Quant, S 28 (H) <5 mIU/mL  CBC     Status: Abnormal   Collection Time: 11/01/16  5:56 PM  Result Value Ref Range   WBC 7.2 4.0 - 10.5  K/uL   RBC 3.80 (L) 3.87 - 5.11 MIL/uL   Hemoglobin 11.6 (L) 12.0 - 15.0 g/dL   HCT 33.6 (L) 36.0 - 46.0 %   MCV 88.4 78.0 - 100.0 fL   MCH 30.5 26.0 - 34.0 pg   MCHC 34.5 30.0 - 36.0 g/dL   RDW 13.4 11.5 - 15.5 %   Platelets 271 150 - 400 K/uL  Wet prep, genital     Status: Abnormal   Collection Time: 11/01/16  6:20 PM  Result Value Ref Range   Yeast Wet Prep HPF POC NONE SEEN NONE SEEN   Trich, Wet Prep NONE SEEN NONE SEEN   Clue Cells Wet Prep HPF POC NONE SEEN NONE SEEN   WBC, Wet Prep HPF POC FEW (A) NONE SEEN   Sperm NONE SEEN      Physical Exam In NAD Chest clear Heart RRR without murmur Abd soft, NT, no rebound or guarding Pelvic--thin white vaginal d/c, cervix closed, long, NT.  Uterus small, NT, mild tenderness in right adnexal area. Ext WNL  [place]:  FINDINGS: Intrauterine gestational sac: None visualized  Yolk sac:  Not visualized  Embryo:  Not visualized  Cardiac Activity:  Heart Rate:   bpm  MSD:   mm    w     d  CRL:    mm    w    d                  [place] EDC:  Subchorionic hemorrhage:  None visualized.  Maternal uterus/adnexae: 4 cm cyst in the right ovary. Thin internal septations noted. Multiple cysts within the left ovary, the largest measuring up to 4.4 cm.  IMPRESSION: No intrauterine pregnancy visualized. Differential considerations would include early intrauterine pregnancy too early to visualize, spontaneous abortion, or occult ectopic pregnancy. Recommend close clinical followup and serial quantitative beta HCGs and ultrasounds.  Multiple left ovarian cysts, measuring up to 4.4 cm. 4 cm cyst in the right ovary.  ED Course  Assessment: Early pregnancy Hx infertility Hx incompetent cervix, with PPROM of twins at 18-20 weeks in 2017 Hx right hemorrhagic cyst 09/26/16 Hx early GDM with prior pregnancy  Plan: [facility] Serum progesterone GC/chlamydia Wet prep  Donnel Saxon CNM, MSN 11/01/2016 6:01 PM   Addendum: Consulted with  Dr. Cletis Media: Check CBC, Hgb A1C Order pelvic US now despite pending QHCG.  Donnel Saxon, CNM 11/01/16 6:44p  Addendum (2015) -[place] results as above -Discussed results with patient -Offered and declined tylenol for abdominal pain -Informed of bHcg level and need for follow up for bHCG -Discussed process with patient and informed that may take  several weeks before bHCG levels are at optimal level for [place] visualization. Patient verbalized understanding. Questions and concerns addressed -Message sent to [place], via [place], for contact and scheduling of follow up labs (quants) weekly until high enough to be seen on [place] -Bleeding precautions -Pain management via medication and nonpharmacologic methods Encouraged to call if any questions or concerns arise prior to next scheduled office visit.  -Discharged to home in stable condition  Maryann Conners MSN, CNM 11/01/2016 10:08 PM

## 2016-11-02 DIAGNOSIS — N981 Hyperstimulation of ovaries: Secondary | ICD-10-CM | POA: Insufficient documentation

## 2016-11-02 LAB — GC/CHLAMYDIA PROBE AMP (~~LOC~~) NOT AT ARMC
CHLAMYDIA, DNA PROBE: NEGATIVE
Neisseria Gonorrhea: NEGATIVE

## 2016-11-02 LAB — HEMOGLOBIN A1C
Hgb A1c MFr Bld: 5.8 % — ABNORMAL HIGH (ref 4.8–5.6)
MEAN PLASMA GLUCOSE: 120 mg/dL

## 2016-11-02 LAB — PROGESTERONE: PROGESTERONE: 67.4 ng/mL

## 2016-11-27 ENCOUNTER — Inpatient Hospital Stay (HOSPITAL_COMMUNITY)
Admission: AD | Admit: 2016-11-27 | Discharge: 2016-11-27 | Disposition: A | Discharge status: Home / Self Care | Payer: BLUE CROSS/BLUE SHIELD | Source: Ambulatory Visit | Attending: Obstetrics and Gynecology | Admitting: Obstetrics and Gynecology

## 2016-11-27 ENCOUNTER — Encounter (HOSPITAL_COMMUNITY): Payer: Self-pay | Admitting: *Deleted

## 2016-11-27 DIAGNOSIS — O26891 Other specified pregnancy related conditions, first trimester: Secondary | ICD-10-CM | POA: Diagnosis present

## 2016-11-27 DIAGNOSIS — Z3A01 Less than 8 weeks gestation of pregnancy: Secondary | ICD-10-CM | POA: Insufficient documentation

## 2016-11-27 DIAGNOSIS — O021 Missed abortion: Secondary | ICD-10-CM | POA: Diagnosis not present

## 2016-11-27 LAB — URINALYSIS, ROUTINE W REFLEX MICROSCOPIC
Bilirubin Urine: NEGATIVE
Glucose, UA: NEGATIVE mg/dL
Hgb urine dipstick: NEGATIVE
Ketones, ur: NEGATIVE mg/dL
Leukocytes, UA: NEGATIVE
NITRITE: NEGATIVE
PROTEIN: NEGATIVE mg/dL
Specific Gravity, Urine: 1.012 (ref 1.005–1.030)
pH: 5 (ref 5.0–8.0)

## 2016-11-27 LAB — POCT PREGNANCY, URINE: PREG TEST UR: POSITIVE — AB

## 2016-11-27 NOTE — MAU Note (Signed)
Chief Complaint: no FHR   First Provider Initiated Contact with Patient 11/27/16 1923     SUBJECTIVE HPI: Carla Henderson is a 26 y.o. G3P0020 at 64w5dwho presents to Maternity Admissions reporting was seen by Dr. YKerin Pernaoffice for evaluation of on ovaries.  During [place] was noted to have a fetus with no FHT.  Was told to return on Friday.  Pt and husband had questions so presented to MAU.  Pt has an obstetrically hx of loss of twins before 20 weeks due to PROM and cervical incompetence.  Pt has medical of PCOS and was on Metformin and Clomid 1557mfor this pregnancy. Pt denies bleeding or cramping.     Past Medical History:  Diagnosis Date  . Abnormal Pap smear   . Anemia   . Bronchitis   . Infertility, female   . Ovarian cyst   . PCOS (polycystic ovarian syndrome)   . UTI (lower urinary tract infection)    OB History  Gravida Para Term Preterm AB Living  3       2 0  SAB TAB Ectopic Multiple Live Births  2            # Outcome Date GA Lbr Len/2nd Weight Sex Delivery Anes PTL Lv  3 Current           2 SAB 2017             Birth Comments: twin gestation, a- SAB @ 19 weeks, B-SAB @21  weeks  1 SAB 2014 [id number]         [facility]     Past Surgical History:  Procedure Laterality Date  . CERVICAL CERCLAGE    . LAPAROSCOPIC OVARIAN CYSTECTOMY Left 01/07/2015   Procedure: Attempted LAPAROSCOPIC OVARIAN CYSTECTOMY, Mini laparotomy with excision of left ovarian cyst;  Surgeon: PegMora BellmanD;  Location[facility];  Service: Gynecology;  Laterality: Left;  Requested 01-26-15 @ 3:30p  . WISDOM TOOTH EXTRACTION     Social History   Social History  . Marital status: Single    Spouse name: N/A  . Number of children: N/A  . Years of education: N/A   Occupational History  . Not on file.   Social History Main Topics  . Smoking status: Never Smoker  . Smokeless tobacco: Never Used  . Alcohol use No     Comment: occassional   . Drug use: No   . Sexual activity: Yes    Birth control/ protection: None   Other Topics Concern  . Not on file   Social History Narrative  . No narrative on file   Family History  Problem Relation Age of Onset  . Hypertension Paternal Grandmother   . Diabetes Paternal Grandfather   . Other Neg Hx    No current facility-administered medications on file prior to encounter.    Current Outpatient Prescriptions on File Prior to Encounter  Medication Sig Dispense Refill  . metFORMIN (GLUCOPHAGE) 850 MG tablet Take 1 tablet by mouth daily before supper.     . PRiley Nearingo A Vit-FeFum-FePo-FA (CONCEPT OB) 130-92.4-1 MG CAPS Take 1 capsule by mouth daily. (Patient not taking: Reported on 11/01/2016) 30 capsule 11   Allergies  Allergen Reactions  . Vicodin [Hydrocodone-Acetaminophen] Nausea And Vomiting    Pt states does not cause swelling    I have reviewed patient's Past Medical Hx, Surgical Hx, Family Hx, Social Hx, medications and allergies.   Review of  Systems  Constitutional: Negative.   HENT: Negative.   Eyes: Negative.   Respiratory: Negative.   Cardiovascular: Negative.   Gastrointestinal: Negative.   Endocrine: Negative.   Genitourinary: Negative.   Musculoskeletal: Negative.   Skin: Negative.     OBJECTIVE Patient Vitals for the past 24 hrs:  BP Temp Temp src Pulse Resp  11/27/16 1727 131/90 98.2 F (36.8 C) Oral (!) 107 18   Constitutional: Well-developed, well-nourished female in no acute distress.  Cardiovascular: normal rate Respiratory: normal rate and effort.  GI: Abd soft, non-tender, gravid appropriate for gestational age. MS: Extremities nontender, no edema, normal ROM Neurologic: Alert and oriented x 4.  GU: Deferred LAB RESULTS Results for orders placed or performed during the hospital encounter of 11/27/16 (from the past 24 hour(s))  Urinalysis, Routine w reflex microscopic     Status: Abnormal   Collection Time: 11/27/16  6:30 PM  Result Value Ref Range    Color, Urine YELLOW YELLOW   APPearance HAZY (A) CLEAR   Specific Gravity, Urine 1.012 1.005 - 1.030   pH 5.0 5.0 - 8.0   Glucose, UA NEGATIVE NEGATIVE mg/dL   Hgb urine dipstick NEGATIVE NEGATIVE   Bilirubin Urine NEGATIVE NEGATIVE   Ketones, ur NEGATIVE NEGATIVE mg/dL   Protein, ur NEGATIVE NEGATIVE mg/dL   Nitrite NEGATIVE NEGATIVE   Leukocytes, UA NEGATIVE NEGATIVE  Pregnancy, urine POC     Status: Abnormal   Collection Time: 11/27/16  6:37 PM  Result Value Ref Range   Preg Test, Ur POSITIVE (A) NEGATIVE    IMAGING  MAU COURSE Orders Placed This Encounter  Procedures  . Urinalysis, Routine w reflex microscopic  . Pregnancy, urine POC   No orders of the defined types were placed in this encounter.   MDM UA UPT ASSESSMENT Missed Abortion  PLAN Discharge home in stable condition.  Discussed with pt and partner that without [place] report it is hard to give a final diagnosis.  Discussed that at 7 weeks it would be normal to see a heart beat.  If this is a missed abortion she has the option of expectant management, chemical induction vs d and c.  All procedures explained and questions answered.  Pt offered to follow up on Friday with infertility MD or to follow up with our office.  We will need to obtain records from the [place] today and than make a poc. Bleeding precautions given.  Pt will make an appt [facility] for this week.      Starla Link, CNM 11/27/2016  7:44 PM

## 2016-11-27 NOTE — Progress Notes (Signed)
Will call Irene Shipper, CNM on call at 1900 to see patient.  Noni Saupe, NP was going to see patient, but Dr. Mancel Bale wanted her to wait and let the midwife see her when she starts her shift at 1900.

## 2016-11-27 NOTE — MAU Note (Signed)
Pt had U/S @ Dr. Charlett Lango office today, did not see a heart beat.  Pt denies pain, has an occasional "twinge."  No vaginal bleeding.

## 2017-02-06 ENCOUNTER — Encounter (HOSPITAL_COMMUNITY): Payer: Self-pay | Admitting: Emergency Medicine

## 2017-02-06 ENCOUNTER — Emergency Department (HOSPITAL_COMMUNITY)
Admission: EM | Admit: 2017-02-06 | Discharge: 2017-02-07 | Disposition: A | Discharge status: Home / Self Care | Payer: BLUE CROSS/BLUE SHIELD | Attending: Emergency Medicine | Admitting: Emergency Medicine

## 2017-02-06 DIAGNOSIS — Z5321 Procedure and treatment not carried out due to patient leaving prior to being seen by health care provider: Secondary | ICD-10-CM | POA: Diagnosis not present

## 2017-02-06 DIAGNOSIS — R109 Unspecified abdominal pain: Secondary | ICD-10-CM | POA: Diagnosis present

## 2017-02-06 DIAGNOSIS — M549 Dorsalgia, unspecified: Secondary | ICD-10-CM | POA: Diagnosis not present

## 2017-02-06 LAB — COMPREHENSIVE METABOLIC PANEL
ALT: 41 U/L (ref 14–54)
AST: 36 U/L (ref 15–41)
Albumin: 4 g/dL (ref 3.5–5.0)
Alkaline Phosphatase: 46 U/L (ref 38–126)
Anion gap: 10 (ref 5–15)
BUN: 11 mg/dL (ref 6–20)
CHLORIDE: 104 mmol/L (ref 101–111)
CO2: 23 mmol/L (ref 22–32)
CREATININE: 0.77 mg/dL (ref 0.44–1.00)
Calcium: 9.4 mg/dL (ref 8.9–10.3)
GFR calc non Af Amer: >60 mL/min (ref >60)
Glucose, Bld: 118 mg/dL — ABNORMAL HIGH (ref 65–99)
POTASSIUM: 4 mmol/L (ref 3.5–5.1)
SODIUM: 137 mmol/L (ref 135–145)
Total Bilirubin: 0.8 mg/dL (ref 0.3–1.2)
Total Protein: 7.6 g/dL (ref 6.5–8.1)

## 2017-02-06 LAB — URINALYSIS, ROUTINE W REFLEX MICROSCOPIC
BILIRUBIN URINE: NEGATIVE
Glucose, UA: NEGATIVE mg/dL
Hgb urine dipstick: NEGATIVE
KETONES UR: 80 mg/dL — AB
Leukocytes, UA: NEGATIVE
Nitrite: NEGATIVE
PH: 9 — AB (ref 5.0–8.0)
PROTEIN: 100 mg/dL — AB
Specific Gravity, Urine: 1.03 (ref 1.005–1.030)

## 2017-02-06 LAB — CBC
HEMATOCRIT: 32.3 % — AB (ref 36.0–46.0)
Hemoglobin: 10.6 g/dL — ABNORMAL LOW (ref 12.0–15.0)
MCH: 27.2 pg (ref 26.0–34.0)
MCHC: 32.8 g/dL (ref 30.0–36.0)
MCV: 83 fL (ref 78.0–100.0)
PLATELETS: 306 10*3/uL (ref 150–400)
RBC: 3.89 MIL/uL (ref 3.87–5.11)
RDW: 13.4 % (ref 11.5–15.5)
WBC: 7.4 10*3/uL (ref 4.0–10.5)

## 2017-02-06 LAB — LIPASE, BLOOD: LIPASE: 31 U/L (ref 11–51)

## 2017-02-06 LAB — I-STAT BETA HCG BLOOD, ED (MC, WL, AP ONLY)

## 2017-02-06 MED ORDER — ONDANSETRON 4 MG PO TBDP
4.0000 mg | ORAL_TABLET | Freq: Once | ORAL | Status: AC | PRN
  Administered 2017-02-06: 4 mg via ORAL

## 2017-02-06 MED ORDER — ONDANSETRON 4 MG PO TBDP
ORAL_TABLET | ORAL | Status: AC
  Filled 2017-02-06: qty 1

## 2017-02-06 NOTE — ED Notes (Signed)
Called for vitals x3, no answer.

## 2017-02-06 NOTE — ED Triage Notes (Signed)
Patient with abdominal pain and lower back pain.  Patient states that it started earlier today.  Patient with nausea and vomiting since 230pm.  Patient with history of PCOS.  Recent pregnancy with miscarriage.

## 2017-02-07 NOTE — ED Notes (Signed)
Pt called for room assignment. No answer no response x3.

## 2017-02-28 ENCOUNTER — Encounter (HOSPITAL_COMMUNITY): Payer: Self-pay | Admitting: Emergency Medicine

## 2017-02-28 ENCOUNTER — Observation Stay (HOSPITAL_COMMUNITY)
Admission: EM | Admit: 2017-02-28 | Discharge: 2017-03-02 | Disposition: A | Discharge status: Home / Self Care | Payer: BLUE CROSS/BLUE SHIELD | Attending: Surgery | Admitting: Surgery

## 2017-02-28 ENCOUNTER — Other Ambulatory Visit: Payer: Self-pay

## 2017-02-28 DIAGNOSIS — Z79899 Other long term (current) drug therapy: Secondary | ICD-10-CM | POA: Diagnosis not present

## 2017-02-28 DIAGNOSIS — E282 Polycystic ovarian syndrome: Secondary | ICD-10-CM | POA: Diagnosis not present

## 2017-02-28 DIAGNOSIS — K829 Disease of gallbladder, unspecified: Secondary | ICD-10-CM

## 2017-02-28 DIAGNOSIS — K7581 Nonalcoholic steatohepatitis (NASH): Secondary | ICD-10-CM | POA: Insufficient documentation

## 2017-02-28 DIAGNOSIS — Z8632 Personal history of gestational diabetes: Secondary | ICD-10-CM | POA: Diagnosis present

## 2017-02-28 DIAGNOSIS — K8000 Calculus of gallbladder with acute cholecystitis without obstruction: Secondary | ICD-10-CM

## 2017-02-28 DIAGNOSIS — Z7984 Long term (current) use of oral hypoglycemic drugs: Secondary | ICD-10-CM | POA: Diagnosis not present

## 2017-02-28 DIAGNOSIS — K8066 Calculus of gallbladder and bile duct with acute and chronic cholecystitis without obstruction: Principal | ICD-10-CM | POA: Insufficient documentation

## 2017-02-28 DIAGNOSIS — D509 Iron deficiency anemia, unspecified: Secondary | ICD-10-CM | POA: Diagnosis not present

## 2017-02-28 DIAGNOSIS — Z885 Allergy status to narcotic agent status: Secondary | ICD-10-CM | POA: Diagnosis not present

## 2017-02-28 DIAGNOSIS — R7989 Other specified abnormal findings of blood chemistry: Secondary | ICD-10-CM

## 2017-02-28 DIAGNOSIS — K732 Chronic active hepatitis, not elsewhere classified: Secondary | ICD-10-CM | POA: Diagnosis not present

## 2017-02-28 DIAGNOSIS — K8020 Calculus of gallbladder without cholecystitis without obstruction: Secondary | ICD-10-CM | POA: Diagnosis present

## 2017-02-28 DIAGNOSIS — R945 Abnormal results of liver function studies: Secondary | ICD-10-CM

## 2017-02-28 LAB — SURGICAL PCR SCREEN
MRSA, PCR: NEGATIVE
STAPHYLOCOCCUS AUREUS: NEGATIVE

## 2017-02-28 LAB — COMPREHENSIVE METABOLIC PANEL
ALBUMIN: 3.9 g/dL (ref 3.5–5.0)
ALT: 277 U/L — ABNORMAL HIGH (ref 14–54)
AST: 315 U/L — AB (ref 15–41)
Alkaline Phosphatase: 100 U/L (ref 38–126)
Anion gap: 8 (ref 5–15)
BUN: 10 mg/dL (ref 6–20)
CHLORIDE: 110 mmol/L (ref 101–111)
CO2: 24 mmol/L (ref 22–32)
Calcium: 9 mg/dL (ref 8.9–10.3)
Creatinine, Ser: 0.81 mg/dL (ref 0.44–1.00)
GFR calc Af Amer: >60 mL/min (ref >60)
GFR calc non Af Amer: >60 mL/min (ref >60)
GLUCOSE: 99 mg/dL (ref 65–99)
POTASSIUM: 4.2 mmol/L (ref 3.5–5.1)
SODIUM: 142 mmol/L (ref 135–145)
Total Bilirubin: 1.2 mg/dL (ref 0.3–1.2)
Total Protein: 7 g/dL (ref 6.5–8.1)

## 2017-02-28 LAB — I-STAT BETA HCG BLOOD, ED (MC, WL, AP ONLY): I-stat hCG, quantitative: <5 m[IU]/mL (ref <5)

## 2017-02-28 LAB — URINALYSIS, ROUTINE W REFLEX MICROSCOPIC
Bilirubin Urine: NEGATIVE
Glucose, UA: NEGATIVE mg/dL
HGB URINE DIPSTICK: NEGATIVE
Ketones, ur: NEGATIVE mg/dL
Leukocytes, UA: NEGATIVE
Nitrite: NEGATIVE
Protein, ur: NEGATIVE mg/dL
SPECIFIC GRAVITY, URINE: 1.024 (ref 1.005–1.030)
pH: 5 (ref 5.0–8.0)

## 2017-02-28 LAB — CBC
HEMATOCRIT: 32.3 % — AB (ref 36.0–46.0)
Hemoglobin: 10.4 g/dL — ABNORMAL LOW (ref 12.0–15.0)
MCH: 26.7 pg (ref 26.0–34.0)
MCHC: 32.2 g/dL (ref 30.0–36.0)
MCV: 82.8 fL (ref 78.0–100.0)
Platelets: 255 10*3/uL (ref 150–400)
RBC: 3.9 MIL/uL (ref 3.87–5.11)
RDW: 14.8 % (ref 11.5–15.5)
WBC: 4.6 10*3/uL (ref 4.0–10.5)

## 2017-02-28 LAB — LIPASE, BLOOD: LIPASE: 34 U/L (ref 11–51)

## 2017-02-28 MED ORDER — ACETAMINOPHEN 325 MG PO TABS: 650.0000 mg | ORAL_TABLET | Freq: Four times a day (QID) | ORAL | Status: DC | PRN

## 2017-02-28 MED ORDER — FENTANYL CITRATE (PF) 100 MCG/2ML IJ SOLN
50.0000 ug | Freq: Once | INTRAMUSCULAR | Status: AC
  Administered 2017-02-28: 50 ug via INTRAVENOUS
  Filled 2017-02-28: qty 2

## 2017-02-28 MED ORDER — ONDANSETRON 4 MG PO TBDP: 4.0000 mg | ORAL_TABLET | Freq: Four times a day (QID) | ORAL | Status: DC | PRN

## 2017-02-28 MED ORDER — ACETAMINOPHEN 650 MG RE SUPP: 650.0000 mg | Freq: Four times a day (QID) | RECTAL | Status: DC | PRN

## 2017-02-28 MED ORDER — OXYCODONE HCL 5 MG PO TABS
5.0000 mg | ORAL_TABLET | ORAL | Status: DC | PRN
  Administered 2017-03-02: 10 mg via ORAL
  Filled 2017-02-28: qty 2

## 2017-02-28 MED ORDER — LACTATED RINGERS IV SOLN
INTRAVENOUS | Status: DC
  Administered 2017-02-28 – 2017-03-01 (×2): via INTRAVENOUS
  Administered 2017-03-01: 75 mL via INTRAVENOUS
  Administered 2017-03-01: 12:00:00 via INTRAVENOUS

## 2017-02-28 MED ORDER — DEXTROSE 5 % IV SOLN
2.0000 g | INTRAVENOUS | Status: DC
  Administered 2017-02-28 – 2017-03-01 (×3): 2 g via INTRAVENOUS
  Filled 2017-02-28 (×3): qty 2

## 2017-02-28 MED ORDER — DIPHENHYDRAMINE HCL 50 MG/ML IJ SOLN: 25.0000 mg | Freq: Four times a day (QID) | INTRAMUSCULAR | Status: DC | PRN

## 2017-02-28 MED ORDER — DIPHENHYDRAMINE HCL 25 MG PO CAPS: 25.0000 mg | ORAL_CAPSULE | Freq: Four times a day (QID) | ORAL | Status: DC | PRN

## 2017-02-28 MED ORDER — ONDANSETRON HCL 4 MG/2ML IJ SOLN: 4.0000 mg | Freq: Four times a day (QID) | INTRAMUSCULAR | Status: DC | PRN

## 2017-02-28 MED ORDER — HYDRALAZINE HCL 20 MG/ML IJ SOLN: 10.0000 mg | INTRAMUSCULAR | Status: DC | PRN

## 2017-02-28 MED ORDER — SODIUM CHLORIDE 0.9 % IV BOLUS (SEPSIS)
1000.0000 mL | Freq: Once | INTRAVENOUS | Status: AC
  Administered 2017-02-28: 1000 mL via INTRAVENOUS

## 2017-02-28 MED ORDER — MORPHINE SULFATE (PF) 2 MG/ML IV SOLN
2.0000 mg | INTRAVENOUS | Status: DC | PRN
  Administered 2017-03-01: 2 mg via INTRAVENOUS
  Filled 2017-02-28: qty 1

## 2017-02-28 MED ORDER — ONDANSETRON 4 MG PO TBDP
4.0000 mg | ORAL_TABLET | Freq: Once | ORAL | Status: AC | PRN
  Administered 2017-02-28: 4 mg via ORAL
  Filled 2017-02-28: qty 1

## 2017-02-28 NOTE — ED Notes (Signed)
RN starting a line and collecting bloodwork at this time.

## 2017-02-28 NOTE — ED Notes (Signed)
Pt ambulated to restroom without assistance and is getting a urine sample.

## 2017-02-28 NOTE — ED Triage Notes (Signed)
Patient c/o gallbladder pain. Reports been having abd pains for while and went to [facility] on Monday and was sent for ultrasound which showed gallbladder issues. Patient reports that she was told that she was going to be referred to [facility] but pain too bad per patient to wait to see them. Patient having nausea and reports vomited this morning/

## 2017-02-28 NOTE — ED Notes (Signed)
ED TO INPATIENT HANDOFF REPORT  Name/Age/Gender Carla Henderson 26 y.o. female  Code Status Code Status History    Date Active Date Inactive Code Status Order ID Comments User Context   10/01/2015 02:46 10/01/2015 17:33 Full Code [id number]  Tresea Mall, CNM Inpatient   01/07/2015 17:19 01/08/2015 13:40 Full Code [id number]  Mora Bellman, MD Inpatient      Home/SNF/Other Home  Chief Complaint Gallbladder issues  Level of Care/Admitting Diagnosis ED Disposition    ED Disposition Condition [facility] Area: [facility] [[id number]]  Level of Care: Med-Surg [16]  Diagnosis: Symptomatic cholelithiasis [213086]  Admitting Physician: [place], [place]  Attending Physician: [facility], MD [[id number]]  Bed request comments: 5W  PT Class (Do Not Modify): Observation [104]  PT Acc Code (Do Not Modify): Observation [10022]       Medical History Past Medical History:  Diagnosis Date  . Abnormal Pap smear   . Anemia   . Bronchitis   . Infertility, female   . Ovarian cyst   . PCOS (polycystic ovarian syndrome)   . UTI (lower urinary tract infection)     Allergies Allergies  Allergen Reactions  . Vicodin [Hydrocodone-Acetaminophen] Nausea And Vomiting    Pt states does not cause swelling    IV Location/Drains/Wounds Patient Lines/Drains/Airways Status   Active Line/Drains/Airways    Name:   Placement date:   Placement time:   Site:   Days:   Peripheral IV 02/28/17 Right Antecubital   02/28/17    1138    Antecubital   less than 1   Incision (Closed) 01/07/15 Perineum Other (Comment)   01/07/15    1443     783   Incision (Closed) 01/07/15 Abdomen Other (Comment)   01/07/15    1509     783   Incision - 2 Ports Abdomen 1: Umbilicus 2: Left;Lateral   01/07/15    1506     783          Labs/Imaging Results for orders placed or performed during the hospital encounter of 02/28/17 (from the past 48 hour(s))  Lipase, blood     Status: None   Collection  Time: 02/28/17 11:34 AM  Result Value Ref Range   Lipase 34 11 - 51 U/L  Comprehensive metabolic panel     Status: Abnormal   Collection Time: 02/28/17 11:34 AM  Result Value Ref Range   Sodium 142 135 - 145 mmol/L   Potassium 4.2 3.5 - 5.1 mmol/L   Chloride 110 101 - 111 mmol/L   CO2 24 22 - 32 mmol/L   Glucose, Bld 99 65 - 99 mg/dL   BUN 10 6 - 20 mg/dL   Creatinine, Ser 0.81 0.44 - 1.00 mg/dL   Calcium 9.0 8.9 - 10.3 mg/dL   Total Protein 7.0 6.5 - 8.1 g/dL   Albumin 3.9 3.5 - 5.0 g/dL   AST 315 (H) 15 - 41 U/L   ALT 277 (H) 14 - 54 U/L   Alkaline Phosphatase 100 38 - 126 U/L   Total Bilirubin 1.2 0.3 - 1.2 mg/dL   GFR calc non Af Amer >60 >60 mL/min   GFR calc Af Amer >60 >60 mL/min    Comment: (NOTE) The eGFR has been calculated using the CKD EPI equation. This calculation has not been validated in all clinical situations. eGFR's persistently <60 mL/min signify possible Chronic Kidney Disease.    Anion gap 8 5 - 15  CBC  Status: Abnormal   Collection Time: 02/28/17 11:34 AM  Result Value Ref Range   WBC 4.6 4.0 - 10.5 K/uL   RBC 3.90 3.87 - 5.11 MIL/uL   Hemoglobin 10.4 (L) 12.0 - 15.0 g/dL   HCT 32.3 (L) 36.0 - 46.0 %   MCV 82.8 78.0 - 100.0 fL   MCH 26.7 26.0 - 34.0 pg   MCHC 32.2 30.0 - 36.0 g/dL   RDW 14.8 11.5 - 15.5 %   Platelets 255 150 - 400 K/uL  Urinalysis, Routine w reflex microscopic     Status: None   Collection Time: 02/28/17 11:44 AM  Result Value Ref Range   Color, Urine YELLOW YELLOW   APPearance CLEAR CLEAR   Specific Gravity, Urine 1.024 1.005 - 1.030   pH 5.0 5.0 - 8.0   Glucose, UA NEGATIVE NEGATIVE mg/dL   Hgb urine dipstick NEGATIVE NEGATIVE   Bilirubin Urine NEGATIVE NEGATIVE   Ketones, ur NEGATIVE NEGATIVE mg/dL   Protein, ur NEGATIVE NEGATIVE mg/dL   Nitrite NEGATIVE NEGATIVE   Leukocytes, UA NEGATIVE NEGATIVE  I-Stat beta hCG blood, ED     Status: None   Collection Time: 02/28/17 12:01 PM  Result Value Ref Range   I-stat  hCG, quantitative <5.0 <5 mIU/mL   Comment 3            Comment:   GEST. AGE      CONC.  (mIU/mL)   <=1 WEEK        5 - 50     2 WEEKS       50 - 500     3 WEEKS       100 - 10,000     4 WEEKS     1,000 - 30,000        FEMALE AND NON-PREGNANT FEMALE:     LESS THAN 5 mIU/mL    No results found.  Pending Labs [employer] (From admission, onward)   Start     Ordered   Signed and Held  HIV antibody (Routine Testing)  Once,   R     Signed and Held   Signed and Held  Comprehensive metabolic panel  Tomorrow morning,   R     Signed and Held   Signed and Held  CBC  Tomorrow morning,   R     Signed and Held      Vitals/Pain Today's Vitals   02/28/17 1425 02/28/17 1629 02/28/17 1700 02/28/17 1715  BP: 107/86 120/67    Pulse: 63 60    Resp: 14 16    Temp:  98 F (36.7 C)    TempSrc:  Oral    SpO2: 99% 100%    Height:      PainSc: _0 Isolation Precautions No active isolations  Medications Medications  cefTRIAXone (ROCEPHIN) 2 g in dextrose 5 % 50 mL IVPB (0 g Intravenous Stopped 02/28/17 1704)  ondansetron (ZOFRAN-ODT) disintegrating tablet 4 mg (4 mg Oral Given 02/28/17 1042)  fentaNYL (SUBLIMAZE) injection 50 mcg (50 mcg Intravenous Given 02/28/17 1213)  sodium chloride 0.9 % bolus 1,000 mL (0 mLs Intravenous Stopped 02/28/17 1426)  fentaNYL (SUBLIMAZE) injection 50 mcg (50 mcg Intravenous Given 02/28/17 1701)    Mobility ambulatory

## 2017-02-28 NOTE — H&P (Signed)
[facility] Consult/Admission Note  Carla Henderson 1991/04/03  [id number].    Requesting MD: Dr. Tomi Bamberger Chief Complaint/Reason for Consult: Symptomatic cholelithiasis  HPI:   Patient is a 26 year old female with a history of PCOS, anemia on iron supplementation who presented to the emergency department with complaints of right upper quadrant abdominal pain since this morning. Patient states she's had multiple episodes of this type pain first beginning roughly 2 months ago. She was seen at [facility] ED on October 16 with normal labs. She presented to her primary care provider at [facility] on Monday with similar symptoms who performed an ultrasound. Patient states pain returned this morning roughly around 9 AM. Pain is in the right upper quadrant epigastric region and radiates around to her back, constant, severe, morphine received in the ED made it better. Associated nausea and vomiting. Patient denies fever, chills, diarrhea, blood in her vomit.  Ultrasound report from [facility] showed multiple small gallstones are present without gallbladder wall thickening or pericholecystic fluid. Common bile duct measures 0.3 cm. No intrahepatic biliary dilatation.  Labs: AST 315, ALT 277, hemoglobin 10.4 Patient is afebrile, WBC WNL  ROS:  Review of Systems  Constitutional: Negative for chills, diaphoresis and fever.  HENT: Negative for sore throat.   Respiratory: Negative for cough and shortness of breath.   Cardiovascular: Negative for chest pain.  Gastrointestinal: Positive for abdominal pain, nausea and vomiting. Negative for blood in stool, constipation and diarrhea.  Genitourinary: Negative for dysuria and hematuria.  Skin: Negative for rash.  Neurological: Negative for dizziness and loss of consciousness.  All other systems reviewed and are negative.    Family History  Problem Relation Age of Onset  . Hypertension Paternal Grandmother   . Diabetes Paternal  Grandfather   . Other Neg Hx     Past Medical History:  Diagnosis Date  . Abnormal Pap smear   . Anemia   . Bronchitis   . Infertility, female   . Ovarian cyst   . PCOS (polycystic ovarian syndrome)   . UTI (lower urinary tract infection)     Past Surgical History:  Procedure Laterality Date  . CERVICAL CERCLAGE    . WISDOM TOOTH EXTRACTION      Social History:  reports that  has never smoked. she has never used smokeless tobacco. She reports that she does not drink alcohol or use drugs.  Allergies:  Allergies  Allergen Reactions  . Vicodin [Hydrocodone-Acetaminophen] Nausea And Vomiting    Pt states does not cause swelling     (Not in a hospital admission)  Blood pressure 123/83, pulse 62, temperature 98.4 F (36.9 C), temperature source Oral, resp. rate (!) 22, height 5' 9"  (1.753 m), last menstrual period 01/30/2017, SpO2 100 %, unknown if currently breastfeeding.  Physical Exam  Constitutional: She is oriented to person, place, and time and well-developed, well-nourished, and in no distress. Vital signs are normal. No distress.  HENT:  Head: Normocephalic and atraumatic.  Nose: Nose normal.  Mouth/Throat: Oropharynx is clear and moist. No oropharyngeal exudate.  Eyes: Conjunctivae are normal. Pupils are equal, round, and reactive to light. Right eye exhibits no discharge. Left eye exhibits no discharge. No scleral icterus.  Neck: Normal range of motion. Neck supple. No tracheal deviation present. No thyromegaly present.  Cardiovascular: Normal rate, regular rhythm, normal heart sounds and intact distal pulses. Exam reveals no gallop and no friction rub.  No murmur heard. Pulses:      Radial pulses are 2+  on the right side, and 2+ on the left side.  Pulmonary/Chest: Effort normal and breath sounds normal. No respiratory distress. She has no decreased breath sounds. She has no wheezes. She has no rhonchi. She has no rales.  Abdominal: Soft. Normal appearance and  bowel sounds are normal. She exhibits no distension and no mass. There is no hepatosplenomegaly. There is tenderness in the right upper quadrant. There is no rebound, no guarding and negative Murphy's sign. No hernia.    Well healed previous scar in suprapubic region from abdominal L ovarian cyst removal  Musculoskeletal: Normal range of motion. She exhibits no edema or deformity.  Lymphadenopathy:    She has no cervical adenopathy.  Neurological: She is alert and oriented to person, place, and time.  Skin: Skin is warm and dry. No rash noted. She is not diaphoretic.  Psychiatric: Mood and affect normal.  Nursing note and vitals reviewed.   Results for orders placed or performed during the hospital encounter of 02/28/17 (from the past 48 hour(s))  Lipase, blood     Status: None   Collection Time: 02/28/17 11:34 AM  Result Value Ref Range   Lipase 34 11 - 51 U/L  Comprehensive metabolic panel     Status: Abnormal   Collection Time: 02/28/17 11:34 AM  Result Value Ref Range   Sodium 142 135 - 145 mmol/L   Potassium 4.2 3.5 - 5.1 mmol/L   Chloride 110 101 - 111 mmol/L   CO2 24 22 - 32 mmol/L   Glucose, Bld 99 65 - 99 mg/dL   BUN 10 6 - 20 mg/dL   Creatinine, Ser 0.81 0.44 - 1.00 mg/dL   Calcium 9.0 8.9 - 10.3 mg/dL   Total Protein 7.0 6.5 - 8.1 g/dL   Albumin 3.9 3.5 - 5.0 g/dL   AST 315 (H) 15 - 41 U/L   ALT 277 (H) 14 - 54 U/L   Alkaline Phosphatase 100 38 - 126 U/L   Total Bilirubin 1.2 0.3 - 1.2 mg/dL   GFR calc non Af Amer >60 >60 mL/min   GFR calc Af Amer >60 >60 mL/min    Comment: (NOTE) The eGFR has been calculated using the CKD EPI equation. This calculation has not been validated in all clinical situations. eGFR's persistently <60 mL/min signify possible Chronic Kidney Disease.    Anion gap 8 5 - 15  CBC     Status: Abnormal   Collection Time: 02/28/17 11:34 AM  Result Value Ref Range   WBC 4.6 4.0 - 10.5 K/uL   RBC 3.90 3.87 - 5.11 MIL/uL   Hemoglobin 10.4 (L)  12.0 - 15.0 g/dL   HCT 32.3 (L) 36.0 - 46.0 %   MCV 82.8 78.0 - 100.0 fL   MCH 26.7 26.0 - 34.0 pg   MCHC 32.2 30.0 - 36.0 g/dL   RDW 14.8 11.5 - 15.5 %   Platelets 255 150 - 400 K/uL  Urinalysis, Routine w reflex microscopic     Status: None   Collection Time: 02/28/17 11:44 AM  Result Value Ref Range   Color, Urine YELLOW YELLOW   APPearance CLEAR CLEAR   Specific Gravity, Urine 1.024 1.005 - 1.030   pH 5.0 5.0 - 8.0   Glucose, UA NEGATIVE NEGATIVE mg/dL   Hgb urine dipstick NEGATIVE NEGATIVE   Bilirubin Urine NEGATIVE NEGATIVE   Ketones, ur NEGATIVE NEGATIVE mg/dL   Protein, ur NEGATIVE NEGATIVE mg/dL   Nitrite NEGATIVE NEGATIVE   Leukocytes, UA NEGATIVE NEGATIVE  I-Stat beta hCG blood, ED     Status: None   Collection Time: 02/28/17 12:01 PM  Result Value Ref Range   I-stat hCG, quantitative <5.0 <5 mIU/mL   Comment 3            Comment:   GEST. AGE      CONC.  (mIU/mL)   <=1 WEEK        5 - 50     2 WEEKS       50 - 500     3 WEEKS       100 - 10,000     4 WEEKS     1,000 - 30,000        FEMALE AND NON-PREGNANT FEMALE:     LESS THAN 5 mIU/mL    No results found.    Assessment/Plan  PCOS Iron deficiency anemia  Biliary colic - Possible CBD stone with elevated LFTs, T bili is normal, will monitor, CMP in AM - Will admit patient and take her to the OR tomorrow for laparoscopic cholecystectomy with IOC - NPO at midnight  Thank you for the consult  Kalman Drape, [facility] Surgery 02/28/2017, 2:05 PM Pager: [phone] Consults: [phone] Mon-Fri 7:00 am-4:30 pm Sat-Sun 7:00 am-11:30 am

## 2017-02-28 NOTE — ED Provider Notes (Signed)
[facility] DEPT Provider Note   CSN: [id number] Arrival date & time: 02/28/17  1018     History   Chief Complaint Chief Complaint  Patient presents with  . Abdominal Pain    HPI Carla Henderson is a 26 y.o. female past medical history of PCO S, presenting to the ED with persistent intermittent right upper quadrant abdominal pain that radiates to her back since Monday.  Patient states she was seen at [facility] who did an ultrasound of her abdomen.  She states the results are in today, however she does not know them yet.  She states they suggested the problems with her gallbladder.  She states they were to a surgeon, however she has been having too much pain.  Is not been taking anything for pain.  She is only been taking the ciprofloxacin which she reports was prescribed by the [facility] clinic.  She reports associated nausea and nonbloody nonbilious emesis.  Denies vaginal bleeding or discharge, fevers or chills, chest pain, urinary sx, or other complaints. No Previous history of abdominal surgery.  The history is provided by the patient.    Past Medical History:  Diagnosis Date  . Abnormal Pap smear   . Anemia   . Bronchitis   . Infertility, female   . Ovarian cyst   . PCOS (polycystic ovarian syndrome)   . UTI (lower urinary tract infection)     Patient Active Problem List   Diagnosis Date Noted  . H/O incompetent cervix, currently pregnant--hx loss of 18-20 week twins 2017, cerclage then 11/01/2016  . Infertility, female 11/01/2016  . History of gestational diabetes 11/01/2016  . 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 Overweight 08/17/2015    Past Surgical History:  Procedure Laterality Date  . CERVICAL CERCLAGE    . WISDOM TOOTH EXTRACTION      OB History    Gravida Para Term Preterm AB Living   3       2 0   SAB TAB Ectopic Multiple Live Births   2               Home Medications    Prior to Admission medications     Medication Sig Start Date End Date Taking? Authorizing Provider  acetaminophen (TYLENOL) 500 MG tablet Take 1,000 mg daily as needed by mouth.   Yes [provider]  ciprofloxacin (CIPRO) 500 MG tablet Take 500 mg 2 (two) times daily by mouth. 02/26/17  Yes [provider]  clomiPHENE (CLOMID) 50 MG tablet Take 150 mg by mouth. Take 3 tablets by mouth on days 3 to 7 of cycle.   Yes [provider]  ibuprofen (ADVIL,MOTRIN) 200 MG tablet Take 400 mg daily as needed by mouth.   Yes [provider]  letrozole (FEMARA) 2.5 MG tablet Take 2.5 mg by mouth. Take 2 tablets by mouth on days 3 to 7 of cycle 12/08/16  Yes [provider]  metFORMIN (GLUCOPHAGE) 850 MG tablet Take 1 tablet by mouth daily before supper.    Yes [provider]  promethazine (PHENERGAN) 25 MG tablet Take 25 mg 2 (two) times daily by mouth. 02/26/17  Yes [provider]  Prenat w/o A Vit-FeFum-FePo-FA (CONCEPT OB) 130-92.4-1 MG CAPS Take 1 capsule by mouth daily. Patient not taking: Reported on 11/01/2016 08/17/15   Lorene Dy, CNM    Family History Family History  Problem Relation Age of Onset  . Hypertension Paternal Grandmother   . Diabetes Paternal  Grandfather   . Other Neg Hx     Social History Social History   Tobacco Use  . Smoking status: Never Smoker  . Smokeless tobacco: Never Used  Substance Use Topics  . Alcohol use: No    Comment: occassional   . Drug use: No     Allergies   Vicodin [hydrocodone-acetaminophen]   Review of Systems Review of Systems  Constitutional: Negative for chills and fever.  Respiratory: Negative for shortness of breath.   Cardiovascular: Negative for chest pain.  Gastrointestinal: Positive for abdominal pain, nausea and vomiting. Negative for constipation and diarrhea.  Genitourinary: Negative for dysuria, frequency, vaginal bleeding and vaginal discharge.  All other systems reviewed and are  negative.    Physical Exam Updated Vital Signs BP 107/86 (BP Location: Left Arm)   Pulse 63   Temp 98.4 F (36.9 C) (Oral)   Resp 14   Ht 5' 9"  (1.753 m)   LMP 01/30/2017   SpO2 99%   BMI 32.19 kg/m   Physical Exam  Constitutional: She appears well-developed and well-nourished.  Non-toxic appearance.  HENT:  Head: Normocephalic and atraumatic.  Mouth/Throat: Oropharynx is clear and moist.  Eyes: Conjunctivae are normal.  Cardiovascular: Normal rate, regular rhythm, normal heart sounds and intact distal pulses.  Pulmonary/Chest: Effort normal and breath sounds normal.  Abdominal: Soft. Normal appearance and bowel sounds are normal. She exhibits no distension and no mass. There is tenderness in the right upper quadrant and epigastric area. There is positive Murphy's sign. There is no rigidity and no guarding.  Neurological: She is alert.  Skin: Skin is warm.  Psychiatric: She has a normal mood and affect. Her behavior is normal.  Nursing note and vitals reviewed.    ED Treatments / Results  Labs (all labs ordered are listed, but only abnormal results are displayed) Labs Reviewed  COMPREHENSIVE METABOLIC PANEL - Abnormal; Notable for the following components:      Result Value   AST 315 (*)    ALT 277 (*)    All other components within normal limits  CBC - Abnormal; Notable for the following components:   Hemoglobin 10.4 (*)    HCT 32.3 (*)    All other components within normal limits  LIPASE, BLOOD  URINALYSIS, ROUTINE W REFLEX MICROSCOPIC  I-STAT BETA HCG BLOOD, ED ([facility], [facility], [facility] ONLY)    EKG  EKG Interpretation None       Radiology No results found.  Procedures Procedures (including critical care time)  Medications Ordered in ED Medications  ondansetron (ZOFRAN-ODT) disintegrating tablet 4 mg (4 mg Oral Given 02/28/17 1042)  fentaNYL (SUBLIMAZE) injection 50 mcg (50 mcg Intravenous Given 02/28/17 1213)  sodium chloride 0.9 % bolus 1,000 mL (0 mLs  Intravenous Stopped 02/28/17 1426)     Initial Impression / Assessment and Plan / ED Course  I have reviewed the triage vital signs and the nursing notes.  Pertinent labs & imaging results that were available during my care of the patient were reviewed by me and considered in my medical decision making (see chart for details).  Clinical Course as of Feb 28 1526  Wed Feb 28, 2017  1256 Spoke with Claiborne Billings with general surgery, surgery to see patient.  [JR]  1402 General surgery to admit under Dr. Johney Maine.  [JR]    Clinical Course User Index [JR] Robinson, [place] N, PA-C    Presenting with multiple days of right upper quadrant abdominal pain with nausea and vomiting. Per U/S results  from [facility], exam showing: "Multiple small gallstones, no gallbladder wall thickening or pericholecystic fluid. Common bile duct measuring 0.3cm. No intrahepatic biliary dilatation. 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  White count within normal limits.  Liver enzymes elevated with AST 315 and ALT 277. Hemodynamically stable, afebrile. Symptoms managed with fentanyl and zofran. General surgery consulted, Jackson Latino, PA-C saw patient and recommends admission for cholecystectomy.  Admitted under Dr. Johney Maine.  Patient discussed with Dr. Dorie Rank.  The patient appears reasonably stabilized for admission considering the current resources, flow, and capabilities available in the ED at this time, and I doubt any other [facility] requiring further screening and/or treatment in the ED prior to admission.  Final Clinical Impressions(s) / ED Diagnoses   Final diagnoses:  Symptomatic cholelithiasis    ED Discharge Orders    None       Robinson, [place] N, PA-C 02/28/17 1528    Dorie Rank, MD 03/01/17 [phone]

## 2017-03-01 ENCOUNTER — Observation Stay (HOSPITAL_COMMUNITY): Payer: BLUE CROSS/BLUE SHIELD | Admitting: Certified Registered Nurse Anesthetist

## 2017-03-01 ENCOUNTER — Encounter (HOSPITAL_COMMUNITY): Payer: Self-pay | Admitting: Certified Registered Nurse Anesthetist

## 2017-03-01 ENCOUNTER — Encounter (HOSPITAL_COMMUNITY)
Admission: EM | Disposition: A | Discharge status: Home / Self Care | Payer: Self-pay | Source: Home / Self Care | Attending: Emergency Medicine

## 2017-03-01 ENCOUNTER — Observation Stay (HOSPITAL_COMMUNITY): Payer: BLUE CROSS/BLUE SHIELD

## 2017-03-01 DIAGNOSIS — R7989 Other specified abnormal findings of blood chemistry: Secondary | ICD-10-CM

## 2017-03-01 DIAGNOSIS — K7581 Nonalcoholic steatohepatitis (NASH): Secondary | ICD-10-CM

## 2017-03-01 DIAGNOSIS — E282 Polycystic ovarian syndrome: Secondary | ICD-10-CM | POA: Diagnosis present

## 2017-03-01 DIAGNOSIS — K8000 Calculus of gallbladder with acute cholecystitis without obstruction: Secondary | ICD-10-CM

## 2017-03-01 DIAGNOSIS — R945 Abnormal results of liver function studies: Secondary | ICD-10-CM

## 2017-03-01 HISTORY — PX: LAPAROSCOPIC CHOLECYSTECTOMY SINGLE SITE WITH INTRAOPERATIVE CHOLANGIOGRAM: SHX6538

## 2017-03-01 LAB — COMPREHENSIVE METABOLIC PANEL
ALT: 628 U/L — ABNORMAL HIGH (ref 14–54)
ANION GAP: 6 (ref 5–15)
AST: 632 U/L — AB (ref 15–41)
Albumin: 3.2 g/dL — ABNORMAL LOW (ref 3.5–5.0)
Alkaline Phosphatase: 141 U/L — ABNORMAL HIGH (ref 38–126)
BUN: 11 mg/dL (ref 6–20)
CHLORIDE: 111 mmol/L (ref 101–111)
CO2: 24 mmol/L (ref 22–32)
Calcium: 8.3 mg/dL — ABNORMAL LOW (ref 8.9–10.3)
Creatinine, Ser: 0.74 mg/dL (ref 0.44–1.00)
Glucose, Bld: 95 mg/dL (ref 65–99)
POTASSIUM: 3.8 mmol/L (ref 3.5–5.1)
Sodium: 141 mmol/L (ref 135–145)
TOTAL PROTEIN: 5.9 g/dL — AB (ref 6.5–8.1)
Total Bilirubin: 1.3 mg/dL — ABNORMAL HIGH (ref 0.3–1.2)

## 2017-03-01 LAB — CBC
HEMATOCRIT: 31.1 % — AB (ref 36.0–46.0)
HEMOGLOBIN: 9.8 g/dL — AB (ref 12.0–15.0)
MCH: 26.1 pg (ref 26.0–34.0)
MCHC: 31.5 g/dL (ref 30.0–36.0)
MCV: 82.9 fL (ref 78.0–100.0)
Platelets: 240 10*3/uL (ref 150–400)
RBC: 3.75 MIL/uL — AB (ref 3.87–5.11)
RDW: 15 % (ref 11.5–15.5)
WBC: 2.9 10*3/uL — AB (ref 4.0–10.5)

## 2017-03-01 LAB — HIV ANTIBODY (ROUTINE TESTING W REFLEX): HIV SCREEN 4TH GENERATION: NONREACTIVE

## 2017-03-01 SURGERY — LAPAROSCOPIC CHOLECYSTECTOMY SINGLE SITE WITH INTRAOPERATIVE CHOLANGIOGRAM
Anesthesia: General

## 2017-03-01 MED ORDER — PROCHLORPERAZINE EDISYLATE 5 MG/ML IJ SOLN: 5.0000 mg | INTRAMUSCULAR | Status: DC | PRN

## 2017-03-01 MED ORDER — MAGIC MOUTHWASH
15.0000 mL | Freq: Four times a day (QID) | ORAL | Status: DC | PRN
  Filled 2017-03-01: qty 15

## 2017-03-01 MED ORDER — FENTANYL CITRATE (PF) 250 MCG/5ML IJ SOLN
INTRAMUSCULAR | Status: DC | PRN
  Administered 2017-03-01: 100 ug via INTRAVENOUS
  Administered 2017-03-01: 150 ug via INTRAVENOUS

## 2017-03-01 MED ORDER — MIDAZOLAM HCL 5 MG/5ML IJ SOLN
INTRAMUSCULAR | Status: DC | PRN
  Administered 2017-03-01: 2 mg via INTRAVENOUS

## 2017-03-01 MED ORDER — METHOCARBAMOL 500 MG PO TABS
1000.0000 mg | ORAL_TABLET | Freq: Four times a day (QID) | ORAL | Status: DC | PRN
  Administered 2017-03-02: 1000 mg via ORAL
  Filled 2017-03-01: qty 2

## 2017-03-01 MED ORDER — KETOROLAC TROMETHAMINE 30 MG/ML IJ SOLN
INTRAMUSCULAR | Status: DC | PRN
  Administered 2017-03-01: 30 mg via INTRAVENOUS

## 2017-03-01 MED ORDER — PROPOFOL 10 MG/ML IV BOLUS
INTRAVENOUS | Status: DC | PRN
  Administered 2017-03-01: 150 mg via INTRAVENOUS

## 2017-03-01 MED ORDER — SUGAMMADEX SODIUM 200 MG/2ML IV SOLN
INTRAVENOUS | Status: AC
  Filled 2017-03-01: qty 2

## 2017-03-01 MED ORDER — PSYLLIUM 95 % PO PACK
1.0000 | PACK | Freq: Every day | ORAL | Status: DC
  Administered 2017-03-01 – 2017-03-02 (×2): 1 via ORAL
  Filled 2017-03-01 (×2): qty 1

## 2017-03-01 MED ORDER — ONDANSETRON HCL 4 MG/2ML IJ SOLN: 4.0000 mg | Freq: Once | INTRAMUSCULAR | Status: DC | PRN

## 2017-03-01 MED ORDER — BUPIVACAINE-EPINEPHRINE 0.25% -1:200000 IJ SOLN
INTRAMUSCULAR | Status: DC | PRN
  Administered 2017-03-01: 50 mL

## 2017-03-01 MED ORDER — BISMUTH SUBSALICYLATE 262 MG/15ML PO SUSP
30.0000 mL | Freq: Three times a day (TID) | ORAL | Status: DC | PRN
  Filled 2017-03-01: qty 236

## 2017-03-01 MED ORDER — DEXAMETHASONE SODIUM PHOSPHATE 10 MG/ML IJ SOLN
INTRAMUSCULAR | Status: DC | PRN
  Administered 2017-03-01: 10 mg via INTRAVENOUS

## 2017-03-01 MED ORDER — PROPOFOL 10 MG/ML IV BOLUS
INTRAVENOUS | Status: AC
  Filled 2017-03-01: qty 20

## 2017-03-01 MED ORDER — HYDROMORPHONE HCL 1 MG/ML IJ SOLN
INTRAMUSCULAR | Status: AC
  Filled 2017-03-01: qty 2

## 2017-03-01 MED ORDER — GABAPENTIN 300 MG PO CAPS
300.0000 mg | ORAL_CAPSULE | Freq: Every day | ORAL | Status: DC
  Administered 2017-03-01: 300 mg via ORAL
  Filled 2017-03-01: qty 1

## 2017-03-01 MED ORDER — MIDAZOLAM HCL 2 MG/2ML IJ SOLN
INTRAMUSCULAR | Status: AC
  Filled 2017-03-01: qty 2

## 2017-03-01 MED ORDER — SUGAMMADEX SODIUM 200 MG/2ML IV SOLN
INTRAVENOUS | Status: DC | PRN
  Administered 2017-03-01: 200 mg via INTRAVENOUS

## 2017-03-01 MED ORDER — ROCURONIUM BROMIDE 50 MG/5ML IV SOSY
PREFILLED_SYRINGE | INTRAVENOUS | Status: AC
  Filled 2017-03-01: qty 5

## 2017-03-01 MED ORDER — SCOPOLAMINE 1 MG/3DAYS TD PT72
MEDICATED_PATCH | TRANSDERMAL | Status: DC | PRN
  Administered 2017-03-01: 1 via TRANSDERMAL

## 2017-03-01 MED ORDER — PHENOL 1.4 % MT LIQD
1.0000 | OROMUCOSAL | Status: DC | PRN
  Filled 2017-03-01: qty 177

## 2017-03-01 MED ORDER — 0.9 % SODIUM CHLORIDE (POUR BTL) OPTIME
TOPICAL | Status: DC | PRN
  Administered 2017-03-01: 1000 mL

## 2017-03-01 MED ORDER — LACTATED RINGERS IV BOLUS (SEPSIS): 1000.0000 mL | Freq: Three times a day (TID) | INTRAVENOUS | Status: DC | PRN

## 2017-03-01 MED ORDER — LIDOCAINE 2% (20 MG/ML) 5 ML SYRINGE
INTRAMUSCULAR | Status: AC
  Filled 2017-03-01: qty 5

## 2017-03-01 MED ORDER — SCOPOLAMINE 1 MG/3DAYS TD PT72
MEDICATED_PATCH | TRANSDERMAL | Status: AC
  Filled 2017-03-01: qty 1

## 2017-03-01 MED ORDER — SODIUM CHLORIDE 0.9 % IV SOLN: 250.0000 mL | INTRAVENOUS | Status: DC | PRN

## 2017-03-01 MED ORDER — MEPERIDINE HCL 50 MG/ML IJ SOLN: 6.2500 mg | INTRAMUSCULAR | Status: DC | PRN

## 2017-03-01 MED ORDER — ONDANSETRON HCL 4 MG/2ML IJ SOLN
4.0000 mg | Freq: Four times a day (QID) | INTRAMUSCULAR | Status: DC | PRN
  Filled 2017-03-01: qty 2

## 2017-03-01 MED ORDER — HYDROCORTISONE 1 % EX CREA
1.0000 "application " | TOPICAL_CREAM | Freq: Three times a day (TID) | CUTANEOUS | Status: DC | PRN
  Filled 2017-03-01: qty 28

## 2017-03-01 MED ORDER — LIP MEDEX EX OINT
1.0000 "application " | TOPICAL_OINTMENT | Freq: Two times a day (BID) | CUTANEOUS | Status: DC
  Administered 2017-03-02: 1 via TOPICAL
  Filled 2017-03-01: qty 7

## 2017-03-01 MED ORDER — IOPAMIDOL (ISOVUE-300) INJECTION 61%
INTRAVENOUS | Status: AC
  Filled 2017-03-01: qty 50

## 2017-03-01 MED ORDER — SODIUM CHLORIDE 0.9 % IV SOLN
8.0000 mg | Freq: Four times a day (QID) | INTRAVENOUS | Status: DC | PRN
  Filled 2017-03-01: qty 4

## 2017-03-01 MED ORDER — GUAIFENESIN-DM 100-10 MG/5ML PO SYRP: 10.0000 mL | ORAL_SOLUTION | ORAL | Status: DC | PRN

## 2017-03-01 MED ORDER — BUPIVACAINE-EPINEPHRINE 0.5% -1:200000 IJ SOLN
INTRAMUSCULAR | Status: AC
  Filled 2017-03-01: qty 1

## 2017-03-01 MED ORDER — LIDOCAINE 2% (20 MG/ML) 5 ML SYRINGE
INTRAMUSCULAR | Status: DC | PRN
  Administered 2017-03-01: 80 mg via INTRAVENOUS

## 2017-03-01 MED ORDER — ROCURONIUM BROMIDE 10 MG/ML (PF) SYRINGE
PREFILLED_SYRINGE | INTRAVENOUS | Status: DC | PRN
  Administered 2017-03-01: 20 mg via INTRAVENOUS
  Administered 2017-03-01: 50 mg via INTRAVENOUS

## 2017-03-01 MED ORDER — SODIUM CHLORIDE 0.9 % IV SOLN
25.0000 mg | Freq: Four times a day (QID) | INTRAVENOUS | Status: DC | PRN
  Filled 2017-03-01: qty 1

## 2017-03-01 MED ORDER — ALUM & MAG HYDROXIDE-SIMETH 200-200-20 MG/5ML PO SUSP: 30.0000 mL | Freq: Four times a day (QID) | ORAL | Status: DC | PRN

## 2017-03-01 MED ORDER — IBUPROFEN 200 MG PO TABS
400.0000 mg | ORAL_TABLET | Freq: Four times a day (QID) | ORAL | Status: DC | PRN
  Administered 2017-03-02: 400 mg via ORAL
  Filled 2017-03-01: qty 2

## 2017-03-01 MED ORDER — IOPAMIDOL (ISOVUE-300) INJECTION 61%
INTRAVENOUS | Status: DC | PRN
  Administered 2017-03-01: 5.5 mL

## 2017-03-01 MED ORDER — DEXAMETHASONE SODIUM PHOSPHATE 10 MG/ML IJ SOLN
INTRAMUSCULAR | Status: AC
  Filled 2017-03-01: qty 1

## 2017-03-01 MED ORDER — MENTHOL 3 MG MT LOZG: 1.0000 | LOZENGE | OROMUCOSAL | Status: DC | PRN

## 2017-03-01 MED ORDER — HYDROCORTISONE 2.5 % RE CREA
1.0000 "application " | TOPICAL_CREAM | Freq: Four times a day (QID) | RECTAL | Status: DC | PRN
  Filled 2017-03-01: qty 28.35

## 2017-03-01 MED ORDER — HYDROMORPHONE HCL 1 MG/ML IJ SOLN
0.2500 mg | INTRAMUSCULAR | Status: DC | PRN
  Administered 2017-03-01 (×4): 0.5 mg via INTRAVENOUS

## 2017-03-01 MED ORDER — LIDOCAINE 2% (20 MG/ML) 5 ML SYRINGE
INTRAMUSCULAR | Status: AC
  Filled 2017-03-01: qty 10

## 2017-03-01 MED ORDER — METHOCARBAMOL 1000 MG/10ML IJ SOLN
1000.0000 mg | Freq: Four times a day (QID) | INTRAVENOUS | Status: DC | PRN
  Filled 2017-03-01: qty 10

## 2017-03-01 MED ORDER — ONDANSETRON HCL 4 MG/2ML IJ SOLN
INTRAMUSCULAR | Status: AC
  Filled 2017-03-01: qty 2

## 2017-03-01 MED ORDER — METOCLOPRAMIDE HCL 5 MG/ML IJ SOLN
5.0000 mg | Freq: Four times a day (QID) | INTRAMUSCULAR | Status: DC | PRN
  Administered 2017-03-01: 10 mg via INTRAVENOUS
  Filled 2017-03-01: qty 2

## 2017-03-01 MED ORDER — TRAMADOL HCL 50 MG PO TABS: 50.0000 mg | ORAL_TABLET | Freq: Four times a day (QID) | ORAL | 0 refills | Status: DC | PRN

## 2017-03-01 MED ORDER — BISACODYL 10 MG RE SUPP: 10.0000 mg | Freq: Two times a day (BID) | RECTAL | Status: DC | PRN

## 2017-03-01 MED ORDER — ONDANSETRON HCL 4 MG/2ML IJ SOLN
INTRAMUSCULAR | Status: DC | PRN
  Administered 2017-03-01: 4 mg via INTRAVENOUS

## 2017-03-01 MED ORDER — FENTANYL CITRATE (PF) 250 MCG/5ML IJ SOLN
INTRAMUSCULAR | Status: AC
  Filled 2017-03-01: qty 5

## 2017-03-01 MED ORDER — LACTATED RINGERS IR SOLN
Status: DC | PRN
  Administered 2017-03-01: 2000 mL

## 2017-03-01 MED ORDER — SACCHAROMYCES BOULARDII 250 MG PO CAPS
250.0000 mg | ORAL_CAPSULE | Freq: Two times a day (BID) | ORAL | Status: DC
  Administered 2017-03-01 – 2017-03-02 (×2): 250 mg via ORAL
  Filled 2017-03-01 (×3): qty 1

## 2017-03-01 MED ORDER — SODIUM CHLORIDE 0.9% FLUSH
3.0000 mL | Freq: Two times a day (BID) | INTRAVENOUS | Status: DC
  Administered 2017-03-02: 3 mL via INTRAVENOUS

## 2017-03-01 MED ORDER — LIDOCAINE 2% (20 MG/ML) 5 ML SYRINGE
INTRAMUSCULAR | Status: DC | PRN
  Administered 2017-03-01: 1.5 mg/kg/h via INTRAVENOUS

## 2017-03-01 MED ORDER — SODIUM CHLORIDE 0.9% FLUSH: 3.0000 mL | INTRAVENOUS | Status: DC | PRN

## 2017-03-01 MED ORDER — HYDROMORPHONE HCL 1 MG/ML IJ SOLN
0.5000 mg | INTRAMUSCULAR | Status: DC | PRN
  Administered 2017-03-01 – 2017-03-02 (×5): 1 mg via INTRAVENOUS
  Filled 2017-03-01 (×5): qty 1

## 2017-03-01 MED ORDER — KETOROLAC TROMETHAMINE 30 MG/ML IJ SOLN
INTRAMUSCULAR | Status: AC
  Filled 2017-03-01: qty 1

## 2017-03-01 MED ORDER — CEFTRIAXONE SODIUM 2 G IJ SOLR
INTRAMUSCULAR | Status: AC
  Filled 2017-03-01: qty 2

## 2017-03-01 SURGICAL SUPPLY — 47 items
APPLIER CLIP 5 13 M/L LIGAMAX5 (MISCELLANEOUS) ×2
APR CLP MED LRG 5 ANG JAW (MISCELLANEOUS) ×1
BAG SPEC RTRVL 10 TROC 200 (ENDOMECHANICALS) ×1
BAG SPEC RTRVL LRG 6X4 10 (ENDOMECHANICALS)
CABLE HIGH FREQUENCY MONO STRZ (ELECTRODE) ×2 IMPLANT
CHLORAPREP W/TINT 26ML (MISCELLANEOUS) ×2 IMPLANT
CLIP APPLIE 5 13 M/L LIGAMAX5 (MISCELLANEOUS) ×1 IMPLANT
COVER MAYO STAND STRL (DRAPES) ×2 IMPLANT
COVER SURGICAL LIGHT HANDLE (MISCELLANEOUS) ×2 IMPLANT
DECANTER SPIKE VIAL GLASS SM (MISCELLANEOUS) ×2 IMPLANT
DRAIN CHANNEL 19F RND (DRAIN) IMPLANT
DRAPE C-ARM 42X120 X-RAY (DRAPES) ×2 IMPLANT
DRAPE WARM FLUID 44X44 (DRAPE) ×2 IMPLANT
DRSG TEGADERM 2-3/8X2-3/4 SM (GAUZE/BANDAGES/DRESSINGS) ×2 IMPLANT
DRSG TEGADERM 4X4.75 (GAUZE/BANDAGES/DRESSINGS) ×2 IMPLANT
ELECT REM PT RETURN 15FT ADLT (MISCELLANEOUS) ×2 IMPLANT
ENDOLOOP SUT PDS II  0 18 (SUTURE)
ENDOLOOP SUT PDS II 0 18 (SUTURE) IMPLANT
EVACUATOR SILICONE 100CC (DRAIN) IMPLANT
GAUZE SPONGE 2X2 8PLY STRL LF (GAUZE/BANDAGES/DRESSINGS) ×1 IMPLANT
GLOVE ECLIPSE 8.0 STRL XLNG CF (GLOVE) ×2 IMPLANT
GLOVE INDICATOR 8.0 STRL GRN (GLOVE) ×2 IMPLANT
GOWN STRL REUS W/TWL XL LVL3 (GOWN DISPOSABLE) ×4 IMPLANT
IRRIG SUCT STRYKERFLOW 2 WTIP (MISCELLANEOUS) ×2
IRRIGATION SUCT STRKRFLW 2 WTP (MISCELLANEOUS) ×1 IMPLANT
KIT BASIN OR (CUSTOM PROCEDURE TRAY) ×2 IMPLANT
NDL BIOPSY 14X6 SOFT TISS (NEEDLE) IMPLANT
NEEDLE BIOPSY 14X6 SOFT TISS (NEEDLE) ×2 IMPLANT
PAD POSITIONING PINK XL (MISCELLANEOUS) ×2 IMPLANT
PAD TELFA 2X3 NADH STRL (GAUZE/BANDAGES/DRESSINGS) ×2 IMPLANT
POSITIONER SURGICAL ARM (MISCELLANEOUS) IMPLANT
POUCH RETRIEVAL ECOSAC 10 (ENDOMECHANICALS) ×1 IMPLANT
POUCH RETRIEVAL ECOSAC 10MM (ENDOMECHANICALS) ×1
POUCH SPECIMEN RETRIEVAL 10MM (ENDOMECHANICALS) IMPLANT
SCISSORS LAP 5X35 DISP (ENDOMECHANICALS) ×2 IMPLANT
SET CHOLANGIOGRAPH MIX (MISCELLANEOUS) ×2 IMPLANT
SHEARS HARMONIC ACE PLUS 36CM (ENDOMECHANICALS) ×2 IMPLANT
SPONGE GAUZE 2X2 STER 10/PKG (GAUZE/BANDAGES/DRESSINGS) ×1
SUT MNCRL AB 4-0 PS2 18 (SUTURE) ×2 IMPLANT
SUT PDS AB 1 CT1 27 (SUTURE) ×4 IMPLANT
SYR 20CC LL (SYRINGE) ×2 IMPLANT
TOWEL OR 17X26 10 PK STRL BLUE (TOWEL DISPOSABLE) ×2 IMPLANT
TOWEL OR NON WOVEN STRL DISP B (DISPOSABLE) ×2 IMPLANT
TRAY LAPAROSCOPIC (CUSTOM PROCEDURE TRAY) ×2 IMPLANT
TROCAR BLADELESS OPT 5 100 (ENDOMECHANICALS) ×2 IMPLANT
TROCAR BLADELESS OPT 5 150 (ENDOMECHANICALS) ×2 IMPLANT
TUBING INSUF HEATED (TUBING) ×2 IMPLANT

## 2017-03-01 NOTE — Discharge Instructions (Signed)
LAPAROSCOPIC SURGERY: POST OP INSTRUCTIONS  ######################################################################  EAT Gradually transition to a high fiber diet with a fiber supplement over the next few weeks after discharge.  Start with a pureed / full liquid diet (see below)  WALK Walk an hour a day.  Control your pain to do that.    CONTROL PAIN Control pain so that you can walk, sleep, tolerate sneezing/coughing, go up/down stairs.  HAVE A BOWEL MOVEMENT DAILY Keep your bowels regular to avoid problems.  OK to try a laxative to override constipation.  OK to use an antidairrheal to slow down diarrhea.  Call if not better after 2 tries  CALL IF YOU HAVE PROBLEMS/CONCERNS Call if you are still struggling despite following these instructions. Call if you have concerns not answered by these instructions  ######################################################################    1. DIET: Follow a light bland diet the first 24 hours after arrival home, such as soup, liquids, crackers, etc.  Be sure to include lots of fluids daily.  Avoid fast food or heavy meals as your are more likely to get nauseated.  Eat a low fat the next few days after surgery.   2. Take your usually prescribed home medications unless otherwise directed. 3. PAIN CONTROL: a. Pain is best controlled by a usual combination of three different methods TOGETHER: i. Ice/Heat ii. Over the counter pain medication iii. Prescription pain medication b. Most patients will experience some swelling and bruising around the incisions.  Ice packs or heating pads (30-60 minutes up to 6 times a day) will help. Use ice for the first few days to help decrease swelling and bruising, then switch to heat to help relax tight/sore spots and speed recovery.  Some people prefer to use ice alone, heat alone, alternating between ice & heat.  Experiment to what works for you.  Swelling and bruising can take several weeks to resolve.   c. It is  helpful to take an over-the-counter pain medication regularly for the first few weeks.  Choose one of the following that works best for you: i. Naproxen (Aleve, etc)  Two 222m tabs twice a day ii. Ibuprofen (Advil, etc) Three 2068mtabs four times a day (every meal & bedtime) iii. Acetaminophen (Tylenol, etc) 500-65055mour times a day (every meal & bedtime) d. A  prescription for pain medication (such as oxycodone, hydrocodone, etc) should be given to you upon discharge.  Take your pain medication as prescribed.  i. If you are having problems/concerns with the prescription medicine (does not control pain, nausea, vomiting, rash, itching, etc), please call us [phone] see if we need to switch you to a different pain medicine that will work better for you and/or control your side effect better. ii. If you need a refill on your pain medication, please contact your pharmacy.  They will contact our office to request authorization. Prescriptions will not be filled after 5 pm or on week-ends. 4. Avoid getting constipated.  Between the surgery and the pain medications, it is common to experience some constipation.  Increasing fluid intake and taking a fiber supplement (such as Metamucil, Citrucel, FiberCon, MiraLax, etc) 1-2 times a day regularly will usually help prevent this problem from occurring.  A mild laxative (prune juice, Milk of Magnesia, MiraLax, etc) should be taken according to package directions if there are no bowel movements after 48 hours.   5. Watch out for diarrhea.  If you have many loose bowel movements, simplify your diet to bland foods & liquids for  a few days.  Stop any stool softeners and decrease your fiber supplement.  Switching to mild anti-diarrheal medications (Kayopectate, Pepto Bismol) can help.  If this worsens or does not improve, please call us. 6. Wash / shower every day.  You may shower over the dressings as they are waterproof.  Continue to shower over incision(s)  after the dressing is off. 7. Remove your waterproof bandages 5 days after surgery.  You may leave the incision open to air.  You may replace a dressing/Band-Aid to cover the incision for comfort if you wish.  8. ACTIVITIES as tolerated:   a. You may resume regular (light) daily activities beginning the next day--such as daily self-care, walking, climbing stairs--gradually increasing activities as tolerated.  If you can walk 30 minutes without difficulty, it is safe to try more intense activity such as jogging, treadmill, bicycling, low-impact aerobics, swimming, etc. b. Save the most intensive and strenuous activity for last such as sit-ups, heavy lifting, contact sports, etc  Refrain from any heavy lifting or straining until you are off narcotics for pain control.   c. DO NOT PUSH THROUGH PAIN.  Let pain be your guide: If it hurts to do something, don't do it.  Pain is your body warning you to avoid that activity for another week until the pain goes down. d. You may drive when you are no longer taking prescription pain medication, you can comfortably wear a seatbelt, and you can safely maneuver your car and apply brakes. e. Dennis Bast may have sexual intercourse when it is comfortable.  9. FOLLOW UP in our office a. Please call [facility] at [phone] to set up an appointment to see your surgeon in the office for a follow-up appointment approximately 2-3 weeks after your surgery. b. Make sure that you call for this appointment the day you arrive home to insure a convenient appointment time. 10. IF YOU HAVE DISABILITY OR FAMILY LEAVE FORMS, BRING THEM TO THE OFFICE FOR PROCESSING.  DO NOT GIVE THEM TO YOUR DOCTOR.   WHEN TO CALL us [phone]: 1. Poor pain control 2. Reactions / problems with new medications (rash/itching, nausea, etc)  3. Fever over 101.5 F (38.5 C) 4. Inability to urinate 5. Nausea and/or vomiting 6. Worsening swelling or bruising 7. Continued bleeding from incision. 8. Increased  pain, redness, or drainage from the incision   The clinic staff is available to answer your questions during regular business hours (8:30am-5pm).  Please dont hesitate to call and ask to speak to one of our nurses for clinical concerns.   If you have a medical emergency, go to the nearest emergency room or call 911.  A surgeon from [facility] is always on call at the [facility], [address], [place], [place], [place]  [address] ? MAIN: [phone] ? TOLL FREE: [phone] ?  FAX [phone] [email or web address]    Cholecystitis Cholecystitis is inflammation of the gallbladder. It is often called a gallbladder attack. The gallbladder is a pear-shaped organ that lies beneath the liver on the right side of the body. The gallbladder stores bile, which is a fluid that helps the body to digest fats. If bile builds up in your gallbladder, your gallbladder becomes inflamed. This condition may occur suddenly (be acute). Repeat episodes of acute cholecystitis or prolonged episodes may lead to a long-term (chronic) condition. Cholecystitis is serious and it requires treatment. What are the causes? The most common cause of this  condition is gallstones. Gallstones can block the tube (duct) that carries bile out of your gallbladder. This causes bile to build up. Other causes of this condition include:  Damage to the gallbladder due to a decrease in blood flow.  Infections in the bile ducts.  Scars or kinks in the bile ducts.  Tumors in the liver, pancreas, or gallbladder.  What increases the risk? This condition is more likely to develop in:  People who have sickle cell disease.  People who take birth control pills or use estrogen.  People who have alcoholic liver disease.  People who have liver cirrhosis.  People who have their nutrition delivered through a vein (parenteral nutrition).  People who do not eat or drink  (do fasting) for a long period of time.  People who are obese.  People who have rapid weight loss.  People who are pregnant.  People who have increased triglyceride levels.  People who have pancreatitis.  What are the signs or symptoms? Symptoms of this condition include:  Abdominal pain, especially in the upper right area of the abdomen.  Abdominal tenderness or bloating.  Nausea.  Vomiting.  Fever.  Chills.  Yellowing of the skin and the whites of the eyes (jaundice).  How is this diagnosed? This condition is diagnosed with a medical history and physical exam. You may also have other tests, including:  Imaging tests, such as: ? An ultrasound of the gallbladder. ? A CT scan of the abdomen. ? A gallbladder nuclear scan (HIDA scan). This scan allows your health care provider to see the bile moving from your liver to your gallbladder and to your small intestine. ? MRI.  Blood tests, such as: ? A complete blood count, because the white blood cell count may be higher than normal. ? Liver function tests, because some levels may be higher than normal with certain types of gallstones.  How is this treated? Treatment may include:  Fasting for a certain amount of time.  IV fluids.  Medicine to treat pain or vomiting.  Antibiotic medicine.  Surgery to remove your gallbladder (cholecystectomy). This may happen immediately or at a later time.  Follow these instructions at home: Home care will depend on your treatment. In general:  Take over-the-counter and prescription medicines only as told by your health care provider.  If you were prescribed an antibiotic medicine, take it as told by your health care provider. Do not stop taking the antibiotic even if you start to feel better.  Follow instructions from your health care provider about what to eat or drink. When you are allowed to eat, avoid eating or drinking anything that triggers your symptoms.  Keep all  follow-up visits as told by your health care provider. This is important.  Contact a health care provider if:  Your pain is not controlled with medicine.  You have a fever. Get help right away if:  Your pain moves to another part of your abdomen or to your back.  You continue to have symptoms or you develop new symptoms even with treatment. This information is not intended to replace advice given to you by your health care provider. Make sure you discuss any questions you have with your health care provider. Document Released: 04/10/2005 Document Revised: 08/19/2015 Document Reviewed: 07/22/2014 Elsevier Interactive Patient Education  2017 [employer].

## 2017-03-01 NOTE — Anesthesia Preprocedure Evaluation (Signed)
Anesthesia Evaluation  Patient identified by MRN, date of birth, ID band Patient awake    Reviewed: Allergy & Precautions, NPO status , Patient's Chart, lab work & pertinent test results  Airway Mallampati: I  TM Distance: >3 FB Neck ROM: Full    Dental   Pulmonary    Pulmonary exam normal        Cardiovascular Normal cardiovascular exam     Neuro/Psych    GI/Hepatic   Endo/Other    Renal/GU      Musculoskeletal   Abdominal   Peds  Hematology   Anesthesia Other Findings   Reproductive/Obstetrics                             Anesthesia Physical Anesthesia Plan  ASA: II  Anesthesia Plan: General   Post-op Pain Management:    Induction: Intravenous  PONV Risk Score and Plan: 2 and Dexamethasone and Ondansetron  Airway Management Planned: Oral ETT  Additional Equipment:   Intra-op Plan:   Post-operative Plan: Extubation in OR  Informed Consent: I have reviewed the patients History and Physical, chart, labs and discussed the procedure including the risks, benefits and alternatives for the proposed anesthesia with the patient or authorized representative who has indicated his/her understanding and acceptance.     Plan Discussed with: CRNA and Surgeon  Anesthesia Plan Comments:         Anesthesia Quick Evaluation

## 2017-03-01 NOTE — Transfer of Care (Signed)
Immediate Anesthesia Transfer of Care Note  Patient: Carla Henderson  Procedure(s) Performed: LAPAROSCOPIC CHOLECYSTECTOMY SINGLE SITE WITH INTRAOPERATIVE CHOLANGIOGRAM (N/A )  Patient Location: PACU  Anesthesia Type:General  Level of Consciousness: awake, alert  and oriented  Airway & Oxygen Therapy: Patient Spontanous Breathing and Patient connected to face mask oxygen  Post-op Assessment: Report given to RN and Post -op Vital signs reviewed and stable  Post vital signs: Reviewed and stable  Last Vitals:  Vitals:   03/01/17 0556 03/01/17 1043  BP: 125/88 122/89  Pulse: 61 63  Resp: 16 16  Temp: 37 C 37.1 C  SpO2: 100% 100%    Last Pain:  Vitals:   03/01/17 1043  TempSrc: Oral  PainSc:       Patients Stated Pain Goal: 2 (14/48/18 5631)  Complications: No apparent anesthesia complications

## 2017-03-01 NOTE — Anesthesia Procedure Notes (Signed)
Procedure Name: Intubation Date/Time: 03/01/2017 12:45 PM Performed by: [place] Shakiyla Kook C, CRNA Pre-anesthesia Checklist: Patient identified, Emergency Drugs available, Suction available and Patient being monitored Patient Re-evaluated:Patient Re-evaluated prior to induction Oxygen Delivery Method: Circle system utilized Preoxygenation: Pre-oxygenation with 100% oxygen Induction Type: IV induction Ventilation: Mask ventilation without difficulty Laryngoscope Size: Mac and 3 Grade View: Grade I Tube type: Oral Tube size: 7.0 mm Number of attempts: 1 Airway Equipment and Method: Stylet and Oral airway Placement Confirmation: ETT inserted through vocal cords under direct vision,  positive ETCO2 and breath sounds checked- equal and bilateral Secured at: 20 cm Tube secured with: Tape Dental Injury: Teeth and Oropharynx as per pre-operative assessment

## 2017-03-01 NOTE — Anesthesia Postprocedure Evaluation (Signed)
Anesthesia Post Note  Patient: Carla Henderson  Procedure(s) Performed: LAPAROSCOPIC CHOLECYSTECTOMY SINGLE SITE WITH INTRAOPERATIVE CHOLANGIOGRAM (N/A )     Patient location during evaluation: PACU Anesthesia Type: General Level of consciousness: awake and alert Pain management: pain level controlled Vital Signs Assessment: post-procedure vital signs reviewed and stable Respiratory status: spontaneous breathing, nonlabored ventilation, respiratory function stable and patient connected to nasal cannula oxygen Cardiovascular status: blood pressure returned to baseline and stable Postop Assessment: no apparent nausea or vomiting Anesthetic complications: no    Last Vitals:  Vitals:   03/01/17 1500 03/01/17 1515  BP: 128/79 123/79  Pulse: 88 87  Resp: 15 17  Temp:  36.4 C  SpO2: 100% 100%    Last Pain:  Vitals:   03/01/17 1515  TempSrc:   PainSc: [place] DAVID

## 2017-03-01 NOTE — Progress Notes (Signed)
[facility]  [address] [place], [place] [address] Phone: [phone]  FAX: [phone]      Carla Henderson [id number] November 27, 1990  CARE TEAM:  [facility]P: [facility]  Outpatient Care Team: Patient Care Team: [facility], [place] as PCP - General  Inpatient Treatment Team: Treatment Team: Attending Provider: Edison Pace, Md, MD; Rounding Team: Edison Pace, Md, MD; Technician: Etheleen Sia, [place]   Problem List:   Active Problems:   Symptomatic cholelithiasis   Day of Surgery  02/28/2017 - 03/01/2017  Procedure(s): LAPAROSCOPIC CHOLECYSTECTOMY SINGLE SITE WITH INTRAOPERATIVE CHOLANGIOGRAM   Assessment  Classic attack of biliary colic with persistently elevated liver function tests suspicious for choledocholithiasis  Plan:  Laproscopic cholecystectomy with Intra-Op cholangiogram.  The anatomy & physiology of hepatobiliary & pancreatic function was discussed.  The pathophysiology of gallbladder dysfunction was discussed.  Natural history risks without surgery was discussed.   I feel the risks of no intervention will lead to serious problems that outweigh the operative risks; therefore, I recommended cholecystectomy to remove the pathology.  I explained laparoscopic techniques with possible need for an open approach.  Probable cholangiogram to evaluate the bilary tract was explained as well.    Risks such as bleeding, infection, abscess, leak, injury to other organs, need for repair of tissues / organs, need for further treatment, stroke, heart attack, death, and other risks were discussed.  I noted a good likelihood this will help address the problem.  Possibility that this will not correct all abdominal symptoms was explained.  Goals of post-operative recovery were discussed as well.  We will work to minimize complications.  An educational handout further explaining the pathology and treatment options was given as well.  Questions  were answered.  The patient expresses understanding & wishes to proceed with surgery.   -VTE prophylaxis- SCDs, etc -mobilize as tolerated to help recovery  20 minutes spent in review, evaluation, examination, counseling, and coordination of care.  More than 50% of that time was spent in counseling.  Adin Hector, M.D., F.A.C.S. Gastrointestinal and Minimally Invasive Surgery [facility] [address], [place], [place] [address] [phone] Main / Paging   03/01/2017    Subjective: (Chief complaint)  Still persistent pain but feels a little better this morning.  Ready for surgery.  Objective:  Vital signs:  Vitals:   02/28/17 2022 02/28/17 2023 03/01/17 0556 03/01/17 1043  BP:  121/83 125/88 122/89  Pulse:  61 61 63  Resp:  16 16 16   Temp:  98.3 F (36.8 C) 98.6 F (37 C) 98.7 F (37.1 C)  TempSrc:  Oral Oral Oral  SpO2:  100% 100% 100%  Weight: 84.3 kg (185 lb 13.6 oz)     Height: 5' 9"  (1.753 m)          Intake/Output   Yesterday:  11/07 0701 - 11/08 0700 In: 1881.3 [P.O.:240; I.V.:591.3; IV Piggyback:1050] Out: 350 [Urine:350] This shift:  Total I/O In: 0  Out: 400 [Urine:400]  Bowel function:  Flatus: YES  BM:  No  Drain: (No drain)   Physical Exam:  General: Pt awake/alert/oriented x4 in no acute distress Eyes: PERRL, normal EOM.  Sclera clear.  No icterus Neuro: CN II-XII intact w/o focal sensory/motor deficits. Lymph: No head/neck/groin lymphadenopathy Psych:  No delerium/psychosis/paranoia HENT: Normocephalic, Mucus membranes moist.  No thrush Neck: Supple, No tracheal deviation Chest: No chest wall pain w good excursion CV:  Pulses intact.  Regular rhythm  MS: Normal AROM mjr joints.  No obvious deformity  Abdomen: Soft.  Nondistended.  Tenderness at RUQ>epigastric.  No evidence of peritonitis.  No incarcerated hernias.  Ext:   No deformity.  No mjr edema.  No cyanosis Skin: No petechiae /  purpura  Results:   Labs: Results for orders placed or performed during the hospital encounter of 02/28/17 (from the past 48 hour(s))  Lipase, blood     Status: None   Collection Time: 02/28/17 11:34 AM  Result Value Ref Range   Lipase 34 11 - 51 U/L  Comprehensive metabolic panel     Status: Abnormal   Collection Time: 02/28/17 11:34 AM  Result Value Ref Range   Sodium 142 135 - 145 mmol/L   Potassium 4.2 3.5 - 5.1 mmol/L   Chloride 110 101 - 111 mmol/L   CO2 24 22 - 32 mmol/L   Glucose, Bld 99 65 - 99 mg/dL   BUN 10 6 - 20 mg/dL   Creatinine, Ser 0.81 0.44 - 1.00 mg/dL   Calcium 9.0 8.9 - 10.3 mg/dL   Total Protein 7.0 6.5 - 8.1 g/dL   Albumin 3.9 3.5 - 5.0 g/dL   AST 315 (H) 15 - 41 U/L   ALT 277 (H) 14 - 54 U/L   Alkaline Phosphatase 100 38 - 126 U/L   Total Bilirubin 1.2 0.3 - 1.2 mg/dL   GFR calc non Af Amer >60 >60 mL/min   GFR calc Af Amer >60 >60 mL/min    Comment: (NOTE) The eGFR has been calculated using the CKD EPI equation. This calculation has not been validated in all clinical situations. eGFR's persistently <60 mL/min signify possible Chronic Kidney Disease.    Anion gap 8 5 - 15  CBC     Status: Abnormal   Collection Time: 02/28/17 11:34 AM  Result Value Ref Range   WBC 4.6 4.0 - 10.5 K/uL   RBC 3.90 3.87 - 5.11 MIL/uL   Hemoglobin 10.4 (L) 12.0 - 15.0 g/dL   HCT 32.3 (L) 36.0 - 46.0 %   MCV 82.8 78.0 - 100.0 fL   MCH 26.7 26.0 - 34.0 pg   MCHC 32.2 30.0 - 36.0 g/dL   RDW 14.8 11.5 - 15.5 %   Platelets 255 150 - 400 K/uL  Urinalysis, Routine w reflex microscopic     Status: None   Collection Time: 02/28/17 11:44 AM  Result Value Ref Range   Color, Urine YELLOW YELLOW   APPearance CLEAR CLEAR   Specific Gravity, Urine 1.024 1.005 - 1.030   pH 5.0 5.0 - 8.0   Glucose, UA NEGATIVE NEGATIVE mg/dL   Hgb urine dipstick NEGATIVE NEGATIVE   Bilirubin Urine NEGATIVE NEGATIVE   Ketones, ur NEGATIVE NEGATIVE mg/dL   Protein, ur NEGATIVE NEGATIVE mg/dL    Nitrite NEGATIVE NEGATIVE   Leukocytes, UA NEGATIVE NEGATIVE  I-Stat beta hCG blood, ED     Status: None   Collection Time: 02/28/17 12:01 PM  Result Value Ref Range   I-stat hCG, quantitative <5.0 <5 mIU/mL   Comment 3            Comment:   GEST. AGE      CONC.  (mIU/mL)   <=1 WEEK        5 - 50     2 WEEKS       50 - 500     3 WEEKS       100 - 10,000     4  WEEKS     1,000 - 30,000        FEMALE AND NON-PREGNANT FEMALE:     LESS THAN 5 mIU/mL   Surgical PCR screen     Status: None   Collection Time: 02/28/17  9:50 PM  Result Value Ref Range   MRSA, PCR NEGATIVE NEGATIVE   Staphylococcus aureus NEGATIVE NEGATIVE    Comment: (NOTE) The Xpert SA Assay (FDA approved for NASAL specimens in patients 8 years of age and older), is one component of a comprehensive surveillance program. It is not intended to diagnose infection nor to guide or monitor treatment.   Comprehensive metabolic panel     Status: Abnormal   Collection Time: 03/01/17  4:54 AM  Result Value Ref Range   Sodium 141 135 - 145 mmol/L   Potassium 3.8 3.5 - 5.1 mmol/L   Chloride 111 101 - 111 mmol/L   CO2 24 22 - 32 mmol/L   Glucose, Bld 95 65 - 99 mg/dL   BUN 11 6 - 20 mg/dL   Creatinine, Ser 0.74 0.44 - 1.00 mg/dL   Calcium 8.3 (L) 8.9 - 10.3 mg/dL   Total Protein 5.9 (L) 6.5 - 8.1 g/dL   Albumin 3.2 (L) 3.5 - 5.0 g/dL   AST 632 (H) 15 - 41 U/L   ALT 628 (H) 14 - 54 U/L   Alkaline Phosphatase 141 (H) 38 - 126 U/L   Total Bilirubin 1.3 (H) 0.3 - 1.2 mg/dL   GFR calc non Af Amer >60 >60 mL/min   GFR calc Af Amer >60 >60 mL/min    Comment: (NOTE) The eGFR has been calculated using the CKD EPI equation. This calculation has not been validated in all clinical situations. eGFR's persistently <60 mL/min signify possible Chronic Kidney Disease.    Anion gap 6 5 - 15  CBC     Status: Abnormal   Collection Time: 03/01/17  4:54 AM  Result Value Ref Range   WBC 2.9 (L) 4.0 - 10.5 K/uL   RBC 3.75 (L) 3.87 -  5.11 MIL/uL   Hemoglobin 9.8 (L) 12.0 - 15.0 g/dL   HCT 31.1 (L) 36.0 - 46.0 %   MCV 82.9 78.0 - 100.0 fL   MCH 26.1 26.0 - 34.0 pg   MCHC 31.5 30.0 - 36.0 g/dL   RDW 15.0 11.5 - 15.5 %   Platelets 240 150 - 400 K/uL    Imaging / Studies: No results found.  Medications / Allergies: per chart  Antibiotics: Anti-infectives (From admission, onward)   Start     Dose/Rate Route Frequency Ordered Stop   02/28/17 1700  [MAR Hold]  cefTRIAXone (ROCEPHIN) 2 g in dextrose 5 % 50 mL IVPB     (MAR Hold since 03/01/17 1102)   2 g 100 mL/hr over 30 Minutes Intravenous Every 24 hours 02/28/17 1617          Note: Portions of this report may have been transcribed using voice recognition software. Every effort was made to ensure accuracy; however, inadvertent computerized transcription errors may be present.   Any transcriptional errors that result from this process are unintentional.     Adin Hector, M.D., F.A.C.S. Gastrointestinal and Minimally Invasive Surgery [facility] [address] [place], [place] [address] [phone] Main / Paging   03/01/2017

## 2017-03-01 NOTE — Op Note (Addendum)
03/01/2017  PATIENT:  Carla Henderson  26 y.o. female  Patient Care Team: [facility] as PCP - General  PRE-OPERATIVE DIAGNOSIS:    Acute on Chronic Calculus Cholecystitis  POST-OPERATIVE DIAGNOSIS:   Acute on Chronic Calculus Cholecystitis  Liver: Fatty steatohepatitis  PROCEDURE:  Core Liver Biopsy & SINGLE SITE Laparoscopic cholecystectomy with intraoperative cholangiogram  SURGEON:  Adin Hector, MD, FACS.  ASSISTANT: RNFA   ANESTHESIA:    General with endotracheal intubation Local anesthetic as a field block  EBL:  (See Anesthesia Intraoperative Record) Total I/O In: 1100 [I.V.:1100] Out: 400 [Urine:400]  Delay start of Pharmacological VTE agent (>24hrs) due to surgical blood loss or risk of bleeding:  no  DRAINS: None   SPECIMEN: Gallbladder & Core liver biopsies    DISPOSITION OF SPECIMEN:  PATHOLOGY  COUNTS:  YES  PLAN OF CARE: Admit for overnight observation  PATIENT DISPOSITION:  PACU - hemodynamically stable.  INDICATION: 25 year old female with episode of biliary colic persistent requiring narcotics.  Increased liver function test.  Admitted.  Recommendation made for laparoscopic cholecystectomy with cholangiogram.  Possible liver biopsy.  Possible need for ERCP postoperatively.  The anatomy & physiology of hepatobiliary & pancreatic function was discussed.  The pathophysiology of gallbladder dysfunction was discussed.  Natural history risks without surgery was discussed.   I feel the risks of no intervention will lead to serious problems that outweigh the operative risks; therefore, I recommended cholecystectomy to remove the pathology.  I explained laparoscopic techniques with possible need for an open approach.  Probable cholangiogram to evaluate the bilary tract was explained as well.    Risks such as bleeding, infection, abscess, leak, injury to other organs, need for further treatment, heart attack, death, and other risks were  discussed.  I noted a good likelihood this will help address the problem.  Possibility that this will not correct all abdominal symptoms was explained.  Goals of post-operative recovery were discussed as well.  We will work to minimize complications.  An educational handout further explaining the pathology and treatment options was given as well.  Questions were answered.  The patient expresses understanding & wishes to proceed with surgery.  OR FINDINGS: Thickened gallbladder wall with chronic changes and adhesions consistent with chronic cholecystitis.  Edema and inflammation and near turgid swelling consistent with acute cholecystitis on top of this.  Gland Phillip Heal with classic biliary anatomy.  No evidence of any obstruction, dilatation, mass, tumor, or leak.  Liver: Fatty steatohepatitis  DESCRIPTION:   The patient was identified & brought in the operating room. The patient was positioned supine with arms tucked. SCDs were active during the entire case. The patient underwent general anesthesia without any difficulty.  The abdomen was prepped and draped in a sterile fashion. A Surgical Timeout confirmed our plan.  I made a transverse curvilinear incision through the superior umbilical fold.  I placed a 65m long port through the supraumbilical fascia using a modified Hassan cutdown technique with umbilical stalk fascial countertraction. I began carbon dioxide insufflation.  No change in end tidal CO2 measurement.   Camera inspection revealed no injury. There were no adhesions to the anterior abdominal wall supraumbilically.  I proceeded to continue with single site technique. I placed a #5 port in left upper aspect of the wound. I placed a 5 mm atraumatic grasper in the right inferior aspect of the wound.  I turned attention to the right upper quadrant.  The gallbladder fundus was elevated cephalad. I freed adhesions to  the ventral surface of the gallbladder off carefully.  They were moderately dense  primarily of greater omentum.  Some transverse colon epiploic appendages as well.  I freed the peritoneal coverings between the gallbladder and the liver on the posteriolateral and anteriomedial walls. I alternated between Harmonic & blunt Maryland dissection to help get a good critical view of the cystic artery and cystic duct. I did further dissection to free the inferior two thirds of the gallbladder off the liver bed to get a good critical view of the infundibulum and cystic duct. I dissected out the cystic artery; and, after getting a good 360 view, ligated the anterior & posterior branches of the cystic artery close on the infundibulum using the Harmonic ultrasonic dissection.  I skeletonized the cystic duct.  I placed a clip on the infundibulum. I did a partial cystic duct-otomy and ensured patency. I placed a 5 [place] cholangiocatheter through a puncture site at the right subcostal ridge of the abdominal wall and directed it into the cystic duct.  We ran a cholangiogram with dilute radio-opaque contrast and continuous fluoroscopy. Contrast flowed from a side branch consistent with cystic duct cannulization. Contrast flowed up the common hepatic duct into the right and left intrahepatic chains out to secondary radicals. Contrast flowed down the common bile duct easily across the normal ampulla into the duodenum.  This was consistent with a normal cholangiogram.  I removed the cholangiocatheter. I placed clips on the cystic duct x4.  I completed cystic duct transection. I freed the gallbladder from its remaining attachments to the liver. I ensured hemostasis on the gallbladder fossa of the liver and elsewhere. I inspected the rest of the abdomen & detected no injury nor bleeding elsewhere.  Because of fatty change on the liver with elevated liver function tests and normal cholangiogram, I decided to proceed with core liver biopsies.  I used a 14-gauge needle and did 3 cores on the segment 4 of the liver.   I assured hemostasis.  I removed the gallbladder inside an EcoSac through the supraumbilical fascia.  I had a dilated to 15 mm to get it out given its thickening.  Few small stones noted.  I closed the fascia transversely using  #1 PDS interrupted stitches. I closed the skin using 4-0 monocryl stitch.  Sterile dressing was applied. The patient was extubated & arrived in the PACU in stable condition..  I had discussed postoperative care with the patient in the holding area. I discussed operative findings, updated the patient's status, discussed probable steps to recovery, and gave postoperative recommendations to the patient's significant other.  Recommendations were made.  Questions were answered.  He expressed understanding & appreciation.  Adin Hector, M.D., F.A.C.S. Gastrointestinal and Minimally Invasive Surgery [facility] [address], [place], [place] [address] [phone] Main / Paging  03/01/2017 2:32 PM

## 2017-03-02 LAB — COMPREHENSIVE METABOLIC PANEL
ALBUMIN: 3.5 g/dL (ref 3.5–5.0)
ALT: 514 U/L — ABNORMAL HIGH (ref 14–54)
AST: 198 U/L — AB (ref 15–41)
Alkaline Phosphatase: 115 U/L (ref 38–126)
Anion gap: 9 (ref 5–15)
BILIRUBIN TOTAL: 0.6 mg/dL (ref 0.3–1.2)
BUN: 6 mg/dL (ref 6–20)
CHLORIDE: 105 mmol/L (ref 101–111)
CO2: 25 mmol/L (ref 22–32)
Calcium: 9 mg/dL (ref 8.9–10.3)
Creatinine, Ser: 0.84 mg/dL (ref 0.44–1.00)
GFR calc Af Amer: >60 mL/min (ref >60)
GFR calc non Af Amer: >60 mL/min (ref >60)
GLUCOSE: 104 mg/dL — AB (ref 65–99)
POTASSIUM: 3.5 mmol/L (ref 3.5–5.1)
Sodium: 139 mmol/L (ref 135–145)
TOTAL PROTEIN: 6.7 g/dL (ref 6.5–8.1)

## 2017-03-02 LAB — CBC
HEMATOCRIT: 29.3 % — AB (ref 36.0–46.0)
Hemoglobin: 9.5 g/dL — ABNORMAL LOW (ref 12.0–15.0)
MCH: 26.8 pg (ref 26.0–34.0)
MCHC: 32.4 g/dL (ref 30.0–36.0)
MCV: 82.8 fL (ref 78.0–100.0)
Platelets: 231 10*3/uL (ref 150–400)
RBC: 3.54 MIL/uL — ABNORMAL LOW (ref 3.87–5.11)
RDW: 15.3 % (ref 11.5–15.5)
WBC: 8.2 10*3/uL (ref 4.0–10.5)

## 2017-03-02 MED ORDER — NAPROXEN 500 MG PO TABS: 500.0000 mg | ORAL_TABLET | Freq: Two times a day (BID) | ORAL | Status: DC

## 2017-03-02 MED ORDER — METHOCARBAMOL 500 MG PO TABS: 1000.0000 mg | ORAL_TABLET | Freq: Four times a day (QID) | ORAL | Status: DC | PRN

## 2017-03-02 MED ORDER — BISMUTH SUBSALICYLATE 262 MG/15ML PO SUSP
30.0000 mL | Freq: Three times a day (TID) | ORAL | Status: DC | PRN
  Filled 2017-03-02: qty 236

## 2017-03-02 MED ORDER — METHOCARBAMOL 1000 MG/10ML IJ SOLN
1000.0000 mg | Freq: Four times a day (QID) | INTRAVENOUS | Status: DC | PRN
  Filled 2017-03-02: qty 10

## 2017-03-02 NOTE — Plan of Care (Signed)
  Clinical Measurements: Will remain free from infection 03/02/2017 1042 - Progressing by Dorene Sorrow, RN   Nutrition: Adequate nutrition will be maintained 03/02/2017 1042 - Progressing by Dorene Sorrow, RN   Pain Managment: General experience of comfort will improve 03/02/2017 1042 - Not Progressing by Dorene Sorrow, RN

## 2017-03-02 NOTE — Progress Notes (Signed)
[facility] Progress Note  1 Day Post-Op  Subjective: CC: abdominal pain Patient reports abdominal pain in RUQ, feels sore. Pain improving overall. Encouraged PO pain control. Had some nausea, but this is improved this AM will try to eat some solid food. No flatus yet.  UOP good. VSS.   Objective: Vital signs in last 24 hours: Temp:  [97.6 F (36.4 C)-99 F (37.2 C)] 98.4 F (36.9 C) (11/09 0550) Pulse Rate:  [63-101] 70 (11/09 0550) Resp:  [13-21] 16 (11/09 0550) BP: (106-138)/(64-89) 115/64 (11/09 0550) SpO2:  [94 %-100 %] 94 % (11/09 0550) Last BM Date: 02/26/17  Intake/Output from previous day: 11/08 0701 - 11/09 0700 In: 1950 [P.O.:600; I.V.:1350] Out: 2750 [Urine:2750] Intake/Output this shift: Total I/O In: 360 [P.O.:360] Out: 400 [Urine:400]  PE: Gen:  Alert, NAD, pleasant Card:  Regular rate and rhythm, pedal pulses 2+ BL Pulm:  Normal effort, clear to auscultation bilaterally Abd: Soft, appropriately tender, non-distended, bowel sounds present in all 4 quadrants, no HSM, incisions C/D/I Skin: warm and dry, no rashes  Psych: A&Ox3   Lab Results:  Recent Labs    02/28/17 1134 03/01/17 0454  WBC 4.6 2.9*  HGB 10.4* 9.8*  HCT 32.3* 31.1*  PLT 255 240   BMET Recent Labs    02/28/17 1134 03/01/17 0454  NA 142 141  K 4.2 3.8  CL 110 111  CO2 24 24  GLUCOSE 99 95  BUN 10 11  CREATININE 0.81 0.74  CALCIUM 9.0 8.3*   PT/INR No results for input(s): LABPROT, INR in the last 72 hours. CMP     Component Value Date/Time   NA 141 03/01/2017 0454   K 3.8 03/01/2017 0454   CL 111 03/01/2017 0454   CO2 24 03/01/2017 0454   GLUCOSE 95 03/01/2017 0454   GLUCOSE 95 08/26/2015 1134   BUN 11 03/01/2017 0454   CREATININE 0.74 03/01/2017 0454   CALCIUM 8.3 (L) 03/01/2017 0454   PROT 5.9 (L) 03/01/2017 0454   ALBUMIN 3.2 (L) 03/01/2017 0454   AST 632 (H) 03/01/2017 0454   ALT 628 (H) 03/01/2017 0454   ALKPHOS 141 (H) 03/01/2017 0454   BILITOT 1.3 (H) 03/01/2017 0454   GFRNONAA >60 03/01/2017 0454   GFRAA >60 03/01/2017 0454   Lipase     Component Value Date/Time   LIPASE 34 02/28/2017 1134       Studies/Results: Dg Cholangiogram Operative  Result Date: 03/01/2017 CLINICAL DATA:  Intraoperative cholangiogram during laparoscopic cholecystectomy. EXAM: INTRAOPERATIVE CHOLANGIOGRAM FLUOROSCOPY TIME:  60 seconds COMPARISON:  None FINDINGS: Intraoperative cholangiographic images of the right upper abdominal quadrant during laparoscopic cholecystectomy are provided for review. Surgical clips overlie the expected location of the gallbladder fossa. Contrast injection demonstrates selective cannulation of the central aspect of the cystic duct. There is passage of contrast through the central aspect of the cystic duct with filling of a non dilated common bile duct. There is passage of contrast though the CBD and into the descending portion of the duodenum. There is minimal reflux of injected contrast into the common hepatic duct and central aspect of the non dilated intrahepatic biliary system. There are no discrete filling defects within the opacified portions of the biliary system to suggest the presence of choledocholithiasis. IMPRESSION: No evidence of choledocholithiasis. Electronically Signed   By: Sandi Mariscal M.D.   On: 03/01/2017 14:33    Anti-infectives: Anti-infectives (From admission, onward)   Start     Dose/Rate Route Frequency Ordered Stop  03/01/17 1324  dextrose 5 % with cefTRIAXone (ROCEPHIN) ADS Med    Comments:  Bridget Hartshorn   : cabinet override      03/01/17 1324 03/01/17 1323   02/28/17 1700  cefTRIAXone (ROCEPHIN) 2 g in dextrose 5 % 50 mL IVPB     2 g 100 mL/hr over 30 Minutes Intravenous Every 24 hours 02/28/17 1617         Assessment/Plan PCOS Iron deficiency anemia  Cholelithiasis with Acute cholecystitis S/p laparoscopic cholecystectomy with cholangiogram 03/01/17 Dr. Johney Maine - POD#1 - pain  control - encourage PO pain control with ibuprofen/tylenol/oxycodone - advance diet to [facility] - mobilize, IS - nausea - PRN zofran - liver bx path pending - recheck CMP  FEN: HH VTE: SCDs, lovenox ID: rocephin (11/7>>)  Plan: labs pending. Path pending. Advance diet, pain control. Possibly home this afternoon vs. tomorrow    LOS: 0 days    Brigid Re , [facility] 03/02/2017, 10:36 AM Pager: [phone] Consults: [phone] Mon-Fri 7:00 am-4:30 pm Sat-Sun 7:00 am-11:30 am

## 2017-03-02 NOTE — Progress Notes (Signed)
03/02/17  1355  Reviewed discharge instructions with patient. Patient verbalized understanding of discharge instructions. Copy of discharge instructions, prescription, and work note given to patient.

## 2017-03-02 NOTE — Discharge Summary (Signed)
[facility]/Trauma Discharge Summary   Patient ID: Carla Henderson MRN: [id number] DOB/AGE: 17-Nov-1990 26 y.o.  Admit date: 02/28/2017 Discharge date: 03/02/2017  Admitting Diagnosis: Biliary colic Acute on chronic calculus cholecystitis  Discharge Diagnosis Patient Active Problem List   Diagnosis Date Noted  . Polycystic ovaries 03/01/2017  . Elevated LFTs 03/01/2017  . Acute calculous cholecystitis s/p lap cholecystectomy 03/01/2017 03/01/2017  . Steatohepatitis, nonalcoholic 60/63/0160  . Symptomatic cholelithiasis 02/28/2017  . 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 H/O incompetent cervix, currently pregnant--hx loss of 18-20 week twins 2017, cerclage then 11/01/2016  . Infertility, female 11/01/2016  . History of gestational diabetes 11/01/2016  . 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 History of gestational diabetes mellitus 11/01/2016  . Overweight 08/17/2015    Consultants none  Imaging: Dg Cholangiogram Operative  Result Date: 03/01/2017 CLINICAL DATA:  Intraoperative cholangiogram during laparoscopic cholecystectomy. EXAM: INTRAOPERATIVE CHOLANGIOGRAM FLUOROSCOPY TIME:  60 seconds COMPARISON:  None FINDINGS: Intraoperative cholangiographic images of the right upper abdominal quadrant during laparoscopic cholecystectomy are provided for review. Surgical clips overlie the expected location of the gallbladder fossa. Contrast injection demonstrates selective cannulation of the central aspect of the cystic duct. There is passage of contrast through the central aspect of the cystic duct with filling of a non dilated common bile duct. There is passage of contrast though the CBD and into the descending portion of the duodenum. There is minimal reflux of injected contrast into the common hepatic duct and central aspect of the non dilated intrahepatic biliary system. There are no discrete filling defects within the opacified portions of the biliary system to suggest the  presence of choledocholithiasis. IMPRESSION: No evidence of choledocholithiasis. Electronically Signed   By: Sandi Mariscal M.D.   On: 03/01/2017 14:33    Procedures Dr. Johney Maine (03/01/17) - Laparoscopic Cholecystectomy with IOC and core liver biopsy  HPI: Patient is a 26 year old female with a history of PCOS, anemia on iron supplementation who presented to the emergency department with complaints of right upper quadrant abdominal pain since 9am day of arrival. Patient states she's had multiple episodes of this type pain first beginning roughly 2 months ago. She was seen at [facility] ED on October 16 with normal labs. She presented to her primary care provider at [facility] on Monday with similar symptoms who performed an ultrasound. Patient states pain returned today roughly around 9 AM. Pain is in the right upper quadrant epigastric region and radiates around to her back, constant, severe, morphine received in the ED made it better. Associated nausea and vomiting. Patient denies fever, chills, diarrhea, blood in her vomit.  Ultrasound report from [facility] showed multiple small gallstones are present without gallbladder wall thickening or pericholecystic fluid. Common bile duct measures 0.3 cm. No intrahepatic biliary dilatation.  Labs: AST 315, ALT 277, hemoglobin 10.4 Patient is afebrile, WBC WNL  Hospital Course:  Patient was admitted and underwent procedure listed above.  Tolerated procedure well and was transferred to the floor.  Diet was advanced as tolerated. LFT's were trending down and IOC during surgery showed no evidence of obstruction. Liver core biopsy was taken during surgery and pathology was still pending on date of discharge. Will be discussed at her follow up appointment. On POD#1, the patient was voiding well, tolerating diet, ambulating well, pain well controlled, vital signs stable, incisions c/d/i and felt stable for discharge home.  Patient will follow up in  our office in 2 weeks and knows to call with questions or concerns.  She will call to confirm appointment date/time.    Patient was discharged in good condition.  Physical Exam: General:  Alert, NAD, pleasant, cooperative Cardio: RRR, S1 & S2 normal, no murmur, rubs, gallops Resp: Effort normal, lungs CTA bilaterally, no wheezes, rales, rhonchi Abd:  Soft, ND, normal bowel sounds, mild TTp in RUQ and around umbilicus, incisions C/D/I  Skin: warm and dry, no rashes noted  Allergies as of 03/02/2017      Reactions   Vicodin [hydrocodone-acetaminophen] Nausea And Vomiting   Pt states does not cause swelling      Medication List    STOP taking these medications   ciprofloxacin 500 MG tablet Commonly known as:  CIPRO     TAKE these medications   acetaminophen 500 MG tablet Commonly known as:  TYLENOL Take 1,000 mg daily as needed by mouth.   clomiPHENE 50 MG tablet Commonly known as:  CLOMID Take 150 mg by mouth. Take 3 tablets by mouth on days 3 to 7 of cycle.   CONCEPT OB 130-92.4-1 MG Caps Take 1 capsule by mouth daily.   ibuprofen 200 MG tablet Commonly known as:  ADVIL,MOTRIN Take 400 mg daily as needed by mouth.   letrozole 2.5 MG tablet Commonly known as:  FEMARA Take 2.5 mg by mouth. Take 2 tablets by mouth on days 3 to 7 of cycle   metFORMIN 850 MG tablet Commonly known as:  GLUCOPHAGE Take 1 tablet by mouth daily before supper.   promethazine 25 MG tablet Commonly known as:  PHENERGAN Take 25 mg 2 (two) times daily by mouth.   traMADol 50 MG tablet Commonly known as:  ULTRAM Take 1-2 tablets (50-100 mg total) every 6 (six) hours as needed by mouth for moderate pain or severe pain.        Follow-up [facility], [place]. Go on 03/20/2017.   Specialty:  General Surgery Why:  11/27 at 11:00 am Contact information: [address] [place] [phone]          Signed: [facility] 03/02/2017, 1:39 PM Pager: [phone] Consults: [phone] Mon-Fri 7:00 am-4:30 pm Sat-Sun 7:00 am-11:30 am

## 2017-04-24 NOTE — L&D Delivery Note (Signed)
Delivery Note   03/18/2018  Date of delivery: 03/19/18  Arthor Captain, 27 y.o., @ [redacted]w[redacted]d  G3P0020, who was admitted for IOL for GHTN. I was called to the room when she progressed +1 station in the second stage of labor. Decels were noted during cxt and pushing but resolved spontaneous.  She pushed for 1hours/190m.  She delivered a viable infant, cephalic and restituted to the direct OA.  Post delivery of the newborn Head, turtle sign was noted and anterior should did not deliver with gentle gentle traction. I immediately, called for help, I asked the RN to call the NICU, call Dr DiCharlesetta Garibaldind ask for faculty, pt was immediately placed in [place] 10 second, while I attempted to [place] anterior shoulder, for 5 seconds I was unable to deliver the shoulder, I asked the RN to apply suprapubic pressure, within 2 more second I noted the shoulders were direct OA and the shoulders delivered together. The infant cried, and the cord was clamped and cut by myself immediately, RN had not had time to alert Dr DiCharlesetta Garibaldiue to shoulder dystocia resolved and baby was born. The NICU team immediate used  initial step of NRP were perfmored (Dry, Stimulated, and warmed). Hat placed on baby for thermoregulation. Newborn was well and then placed on the maternal abdomen for skin to skin. A nuchal cord   was not identified, but there was a true knot noted. Delayed cord clamping was not performed due to immediate assessment of the newborn was needed. Apgar scores were 3 and 8. The placenta delivered spontaneously, shultz, with a 3 vessel cord, true knot was noted.  Inspection revealed 2nd degree and bilaterally labial. An examination of the vaginal vault and cervix was free from lacerations. The uterus was firm, bleeding stable.  The repair was done under local.   Placenta was sent to pathology and umbilical artery blood gas were not sent.  There were no complications during the procedure.  Mom and baby skin to skin following  delivery. Left in stable condition.The infant was able to spontaneously move both arms equally.   Maternal Info: Anesthesia:Epidural Episiotomy: No Lacerations:  2nd and bilat labials  Suture Repair: 3.0 vicryl, 4.0 vicryl Est. Blood Loss (mL):  660  Newborn Info: Baby Sex: female Circumcision: Out-pt desired Babies Name: BrErlene QuanPGAR (1 MIN): 3   APGAR (5 MINS): 8   APGAR (10 MINS):     Mom to postpartum.  Baby to Couplet care / Skin to Skin.   Dr DiCharlesetta Garibaldiware of delivery.  Sanjuan Sawa, CNM, NP-C 03/19/18 2:15 AM

## 2017-05-30 IMAGING — US US PELVIS COMPLETE
1 series · 14 of 25 positions shown · non-contrast
Comparison: None

CLINICAL DATA: LLQ pain, cervical tenderness



[Series 1: us pelvis complete · 14 of 69 slices shown]
[im 1/69]
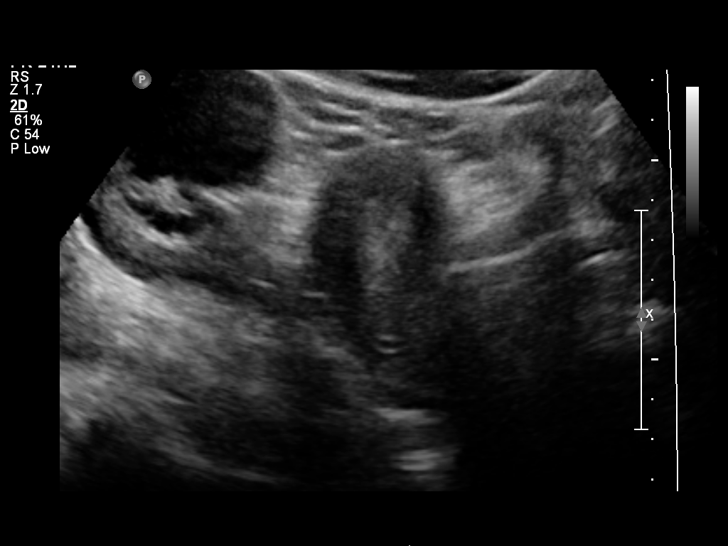
[im 6/69]
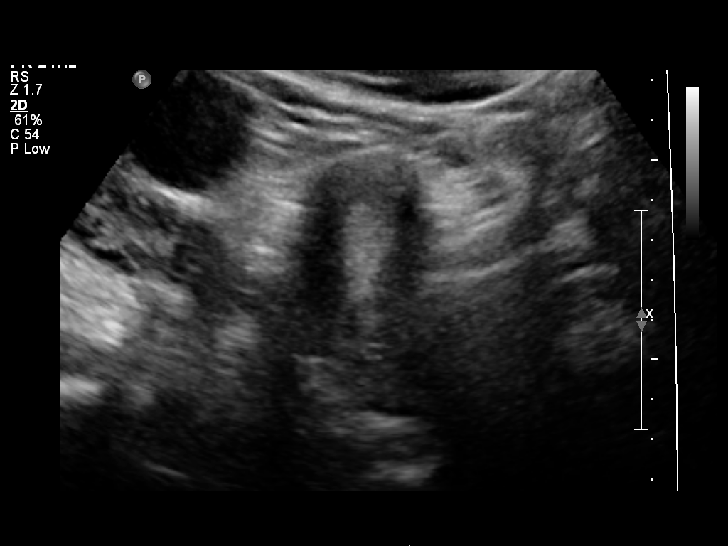
[im 12/69]
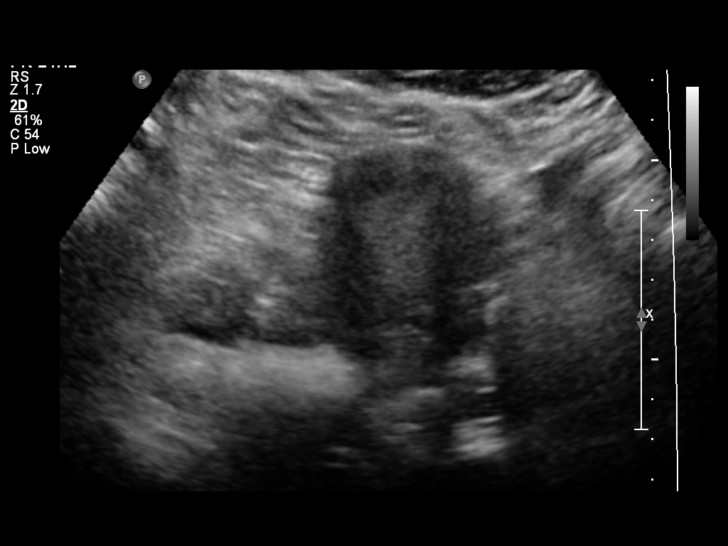
[im 18/69]
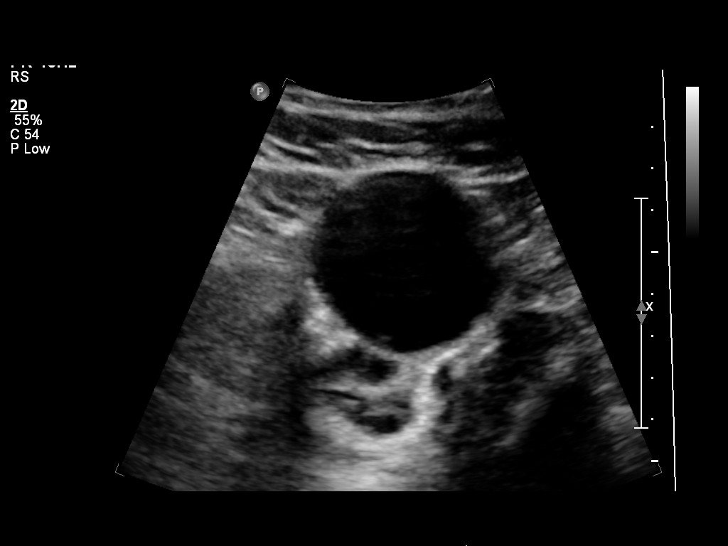
[im 23/69]
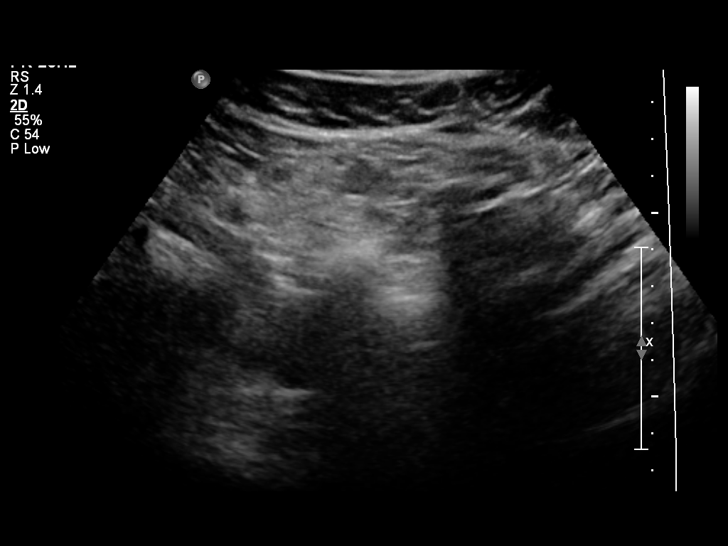
[im 26/69]
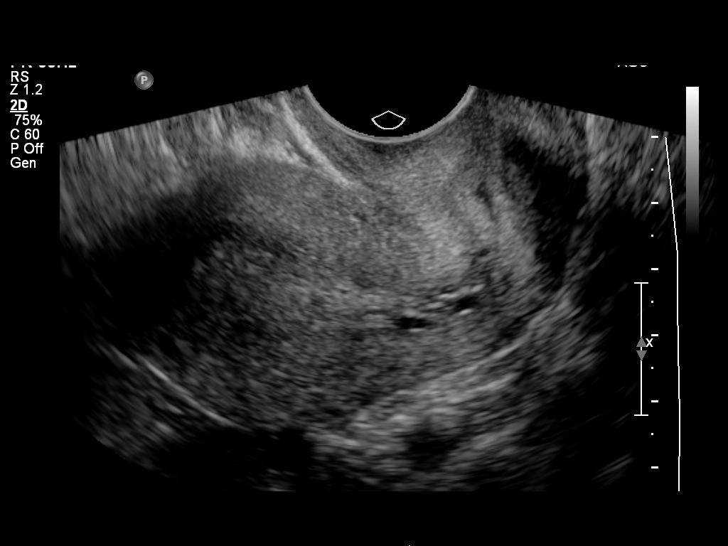
[im 32/69]
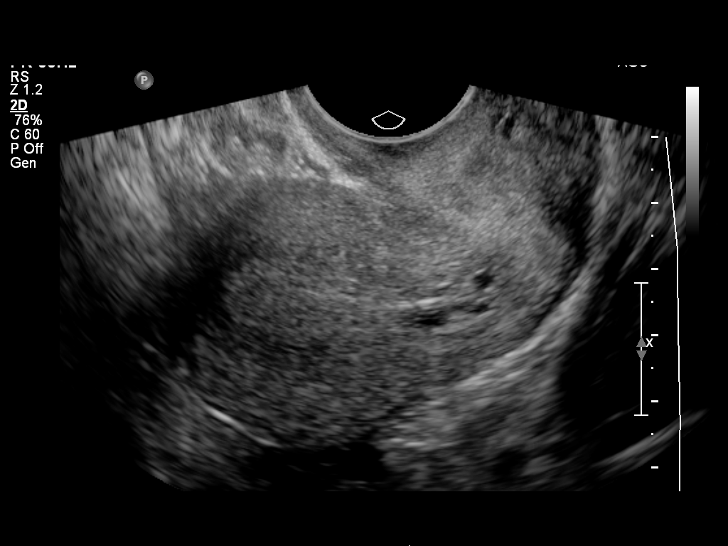
[im 37/69]
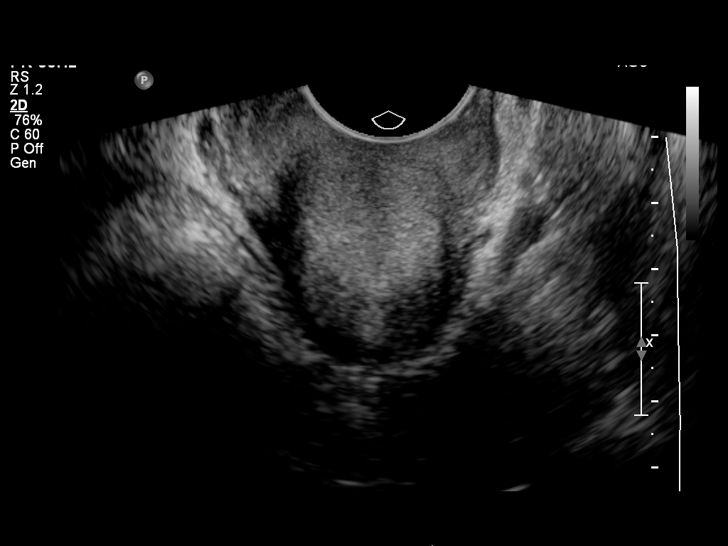
[im 43/69]
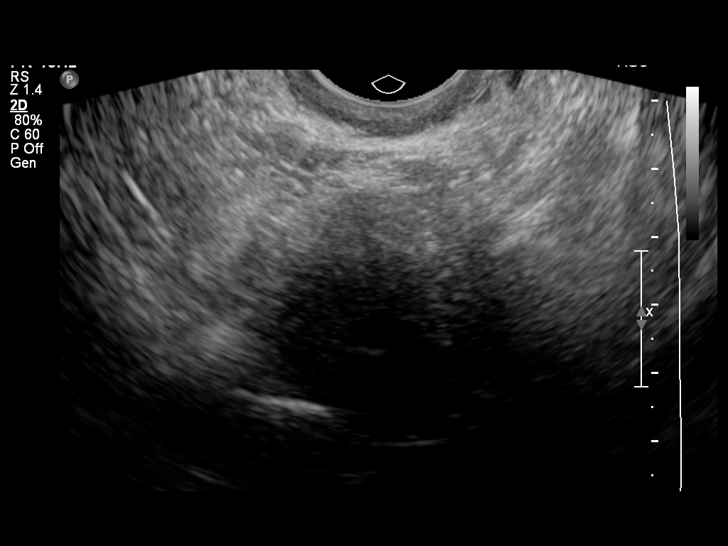
[im 46/69]
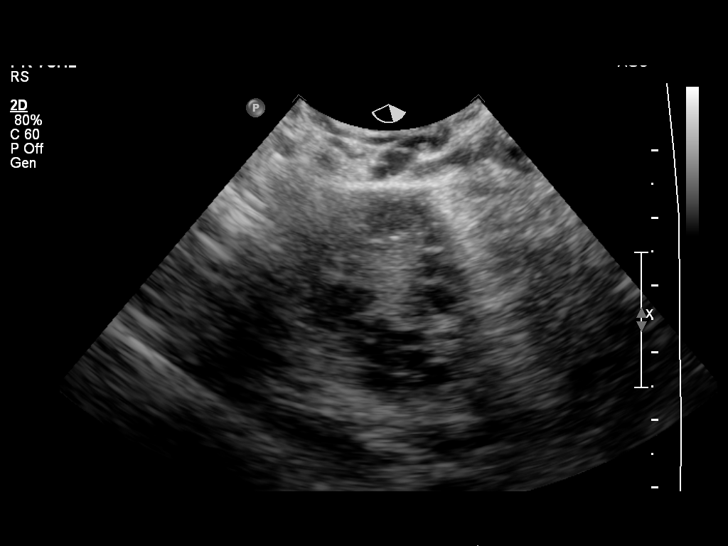
[im 52/69]
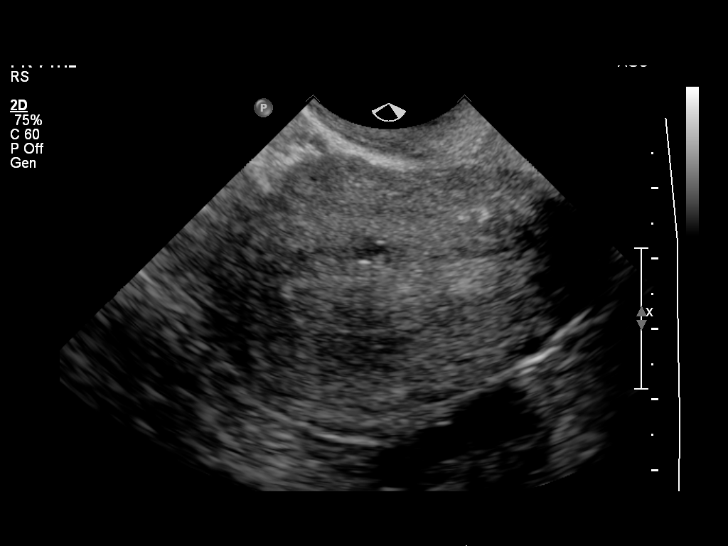
[im 57/69]
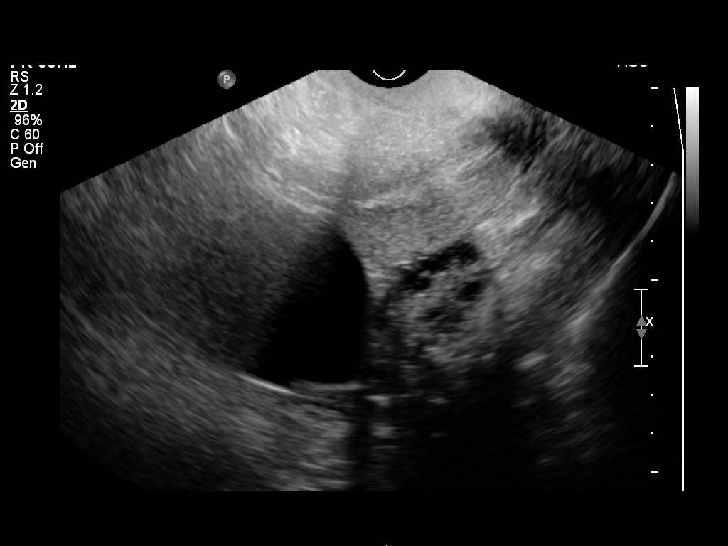
[im 63/69]
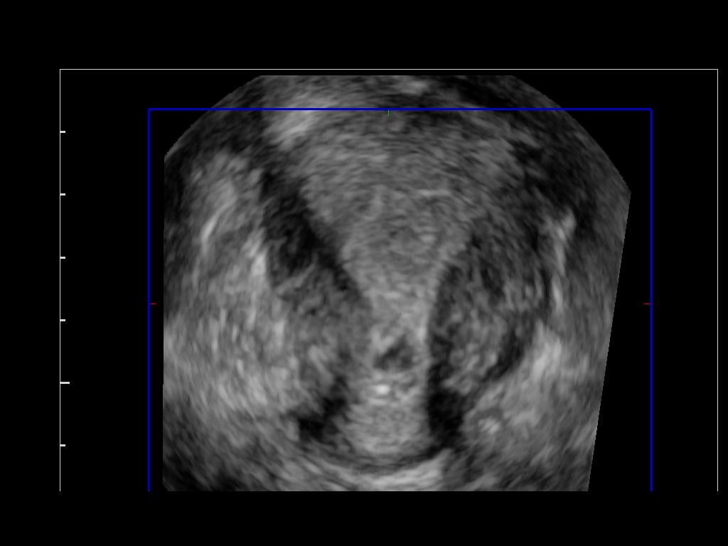
[im 69/69]
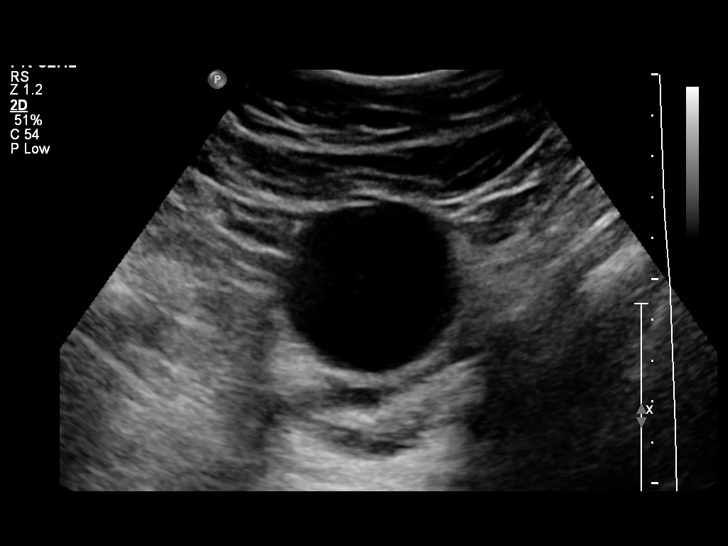

[14 of 25 positions shown; findings below may reference images not displayed]

FINDINGS: Uterus

Measurements: 7.1 x 4.1 x 4.2 cm. No fibroids or other mass
visualized.

Endometrium

Thickness: 11 mm.  No focal abnormality visualized.

Right ovary

Measurements: 3.4 x 2.2 x 2.6 cm. Normal appearance/no adnexal mass.

Left ovary

Measurements: 4.5 x 2.1 x 3.6 cm. 4.5 x 4.5 x 4.2 cm paraovarian
simple cyst with possible small layering mural nodule (image 23).

Other findings

No free fluid.
IMPRESSION: 4.5 cm mildly complex left paraovarian cyst. Follow-up pelvic
ultrasound is suggested in 6-12 weeks.

## 2017-06-19 IMAGING — CT CT HEAD W/O CM
2 series · 16 of 30 positions shown, 19 images · non-contrast
Comparison: None.

CLINICAL DATA: Right temporal region headache radiating into right
neck region for 5 days

EXAM:
CT HEAD WITHOUT CONTRAST
TECHNIQUE: Contiguous axial images were obtained from the base of the skull
through the vertex without intravenous contrast.

[Series 2: head w/o · axial · non-contrast · 0.45mm/px · z∈[-157,-42]mm · 9 of 29 slices shown, 12 images]
[im 3/29  brain]
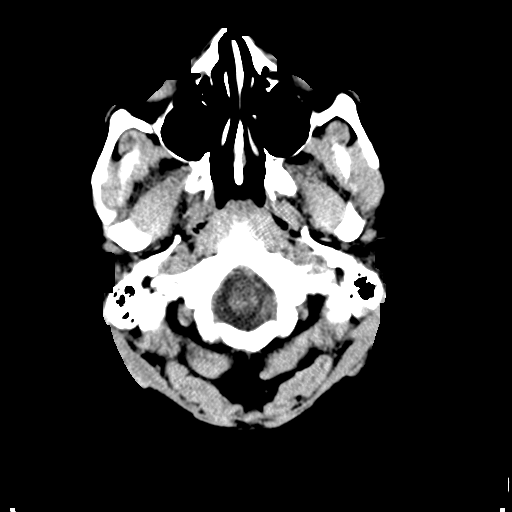
[im 3/29  bone]
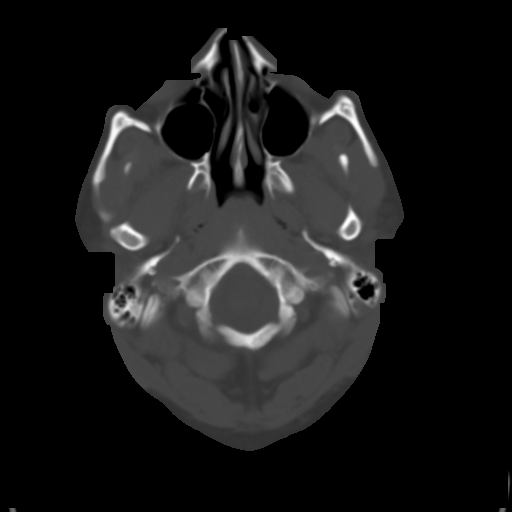
[im 6/29  brain]
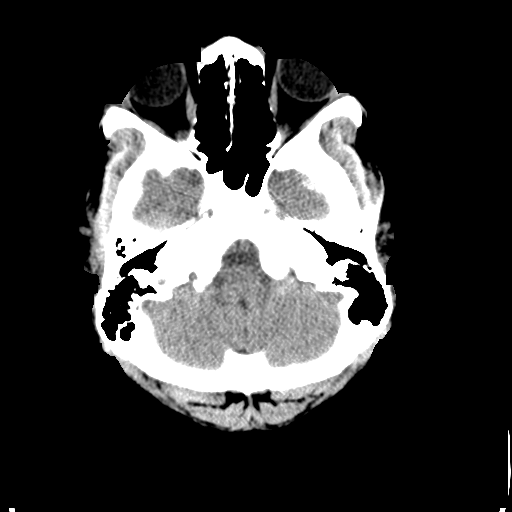
[im 9/29  brain]
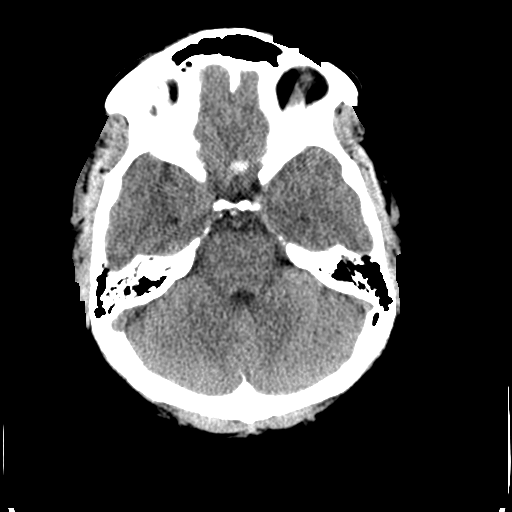
[im 12/29  brain]
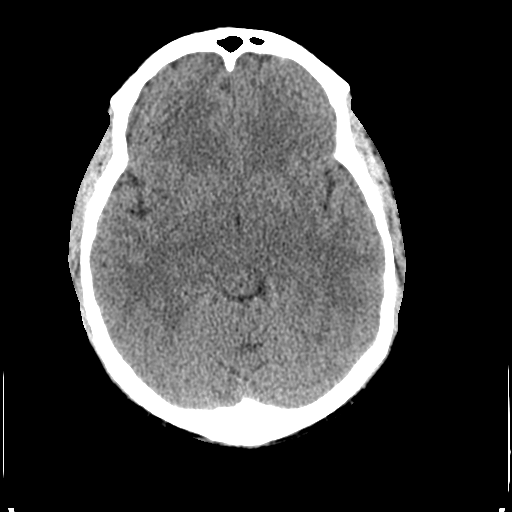
[im 15/29  brain]
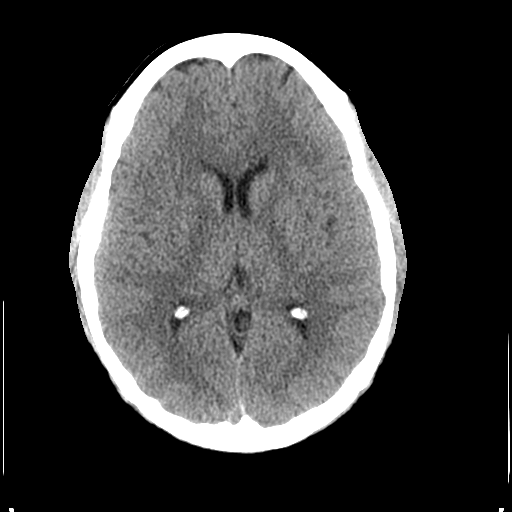
[im 15/29  bone]
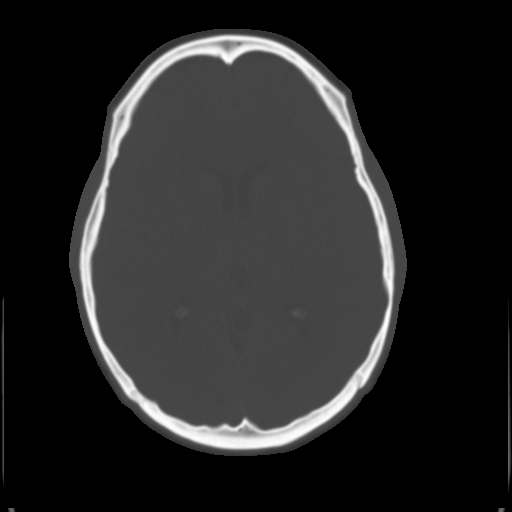
[im 17/29  brain]
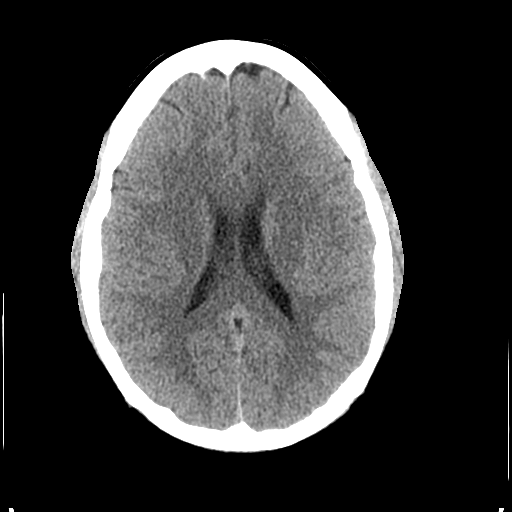
[im 20/29  brain]
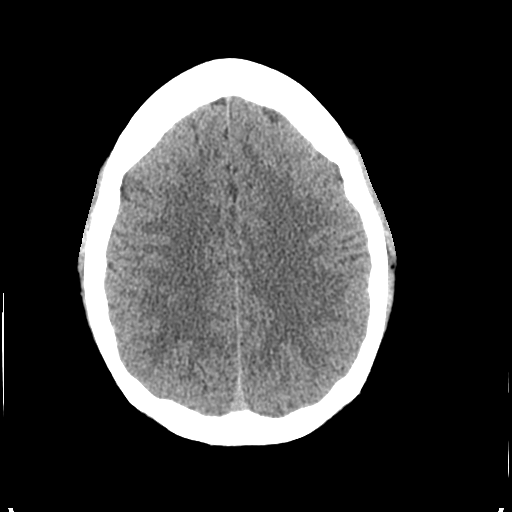
[im 23/29  brain]
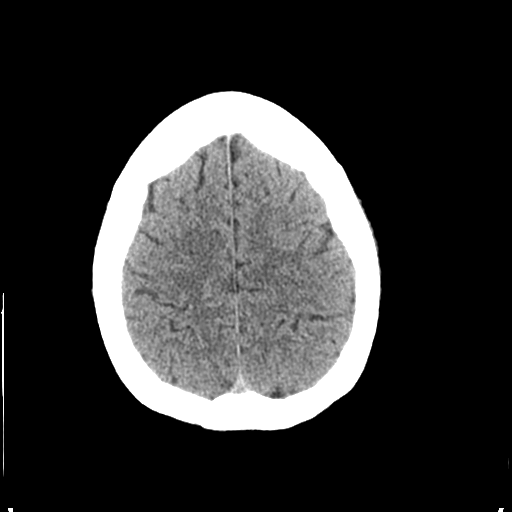
[im 26/29  brain]
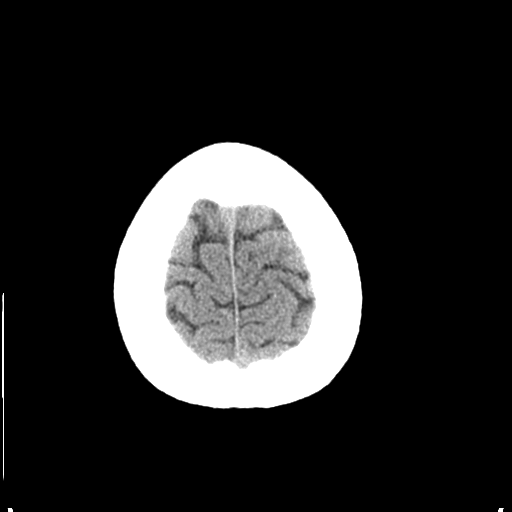
[im 26/29  bone]
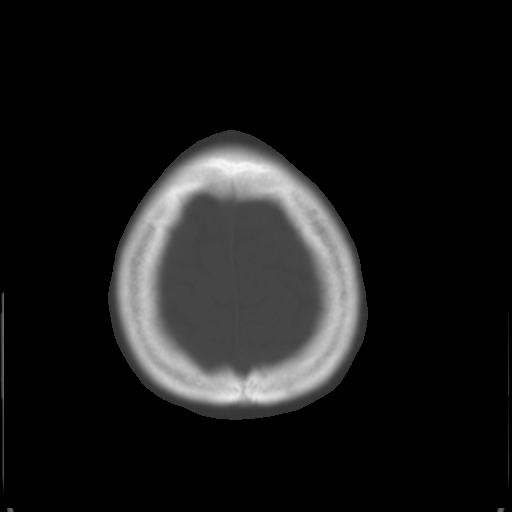

[Series 3: bone windows · axial · 0.45mm/px · z∈[-152,-56]mm · 7 of 49 slices shown]
[im 6/49  bone]
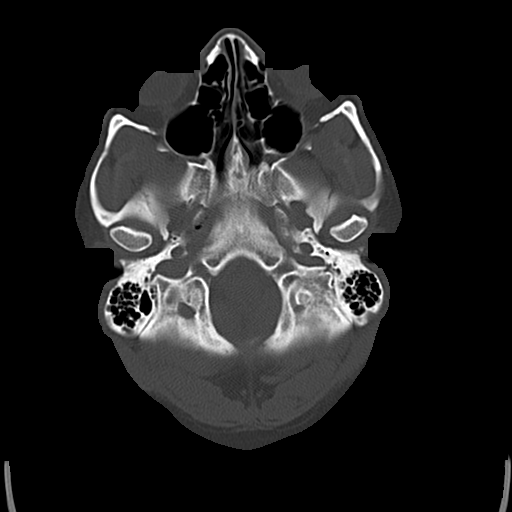
[im 11/49  bone]
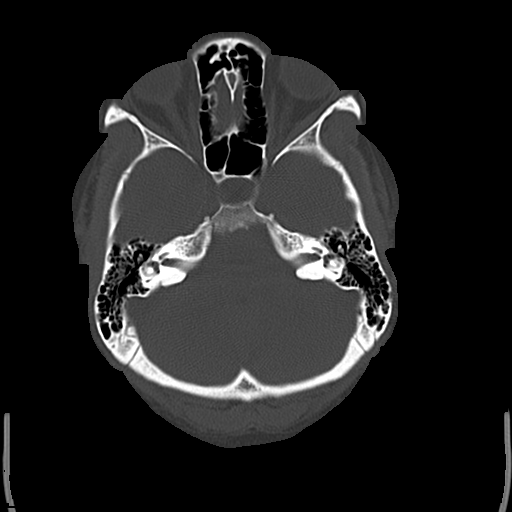
[im 17/49  bone]
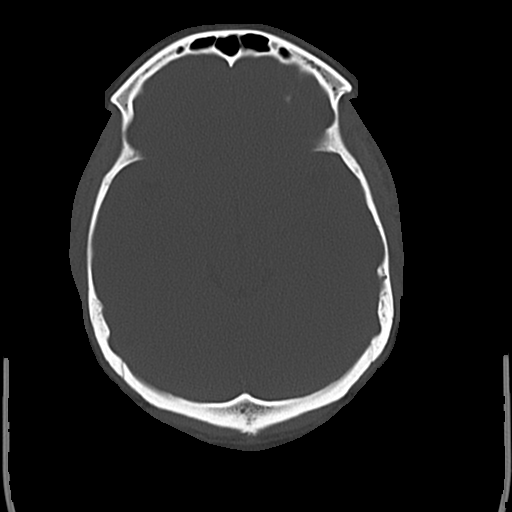
[im 22/49  bone]
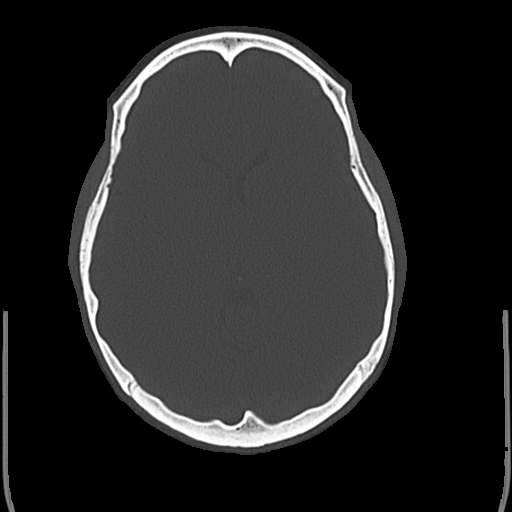
[im 27/49  bone]
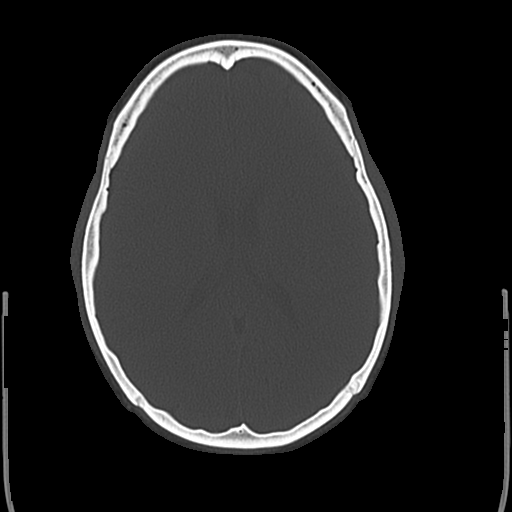
[im 33/49  bone]
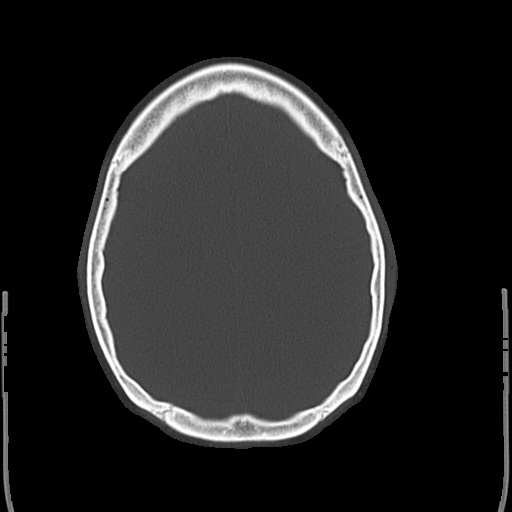
[im 38/49  bone]
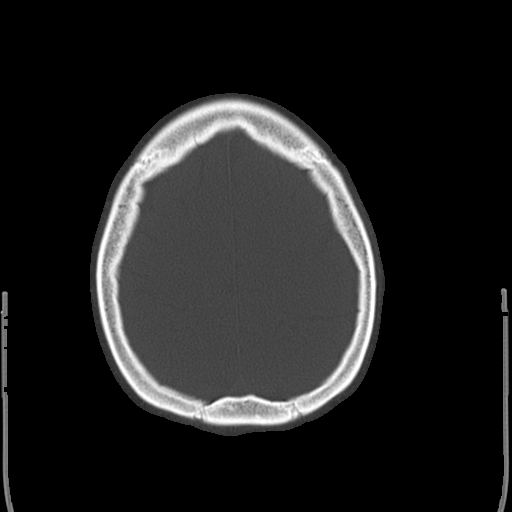

[16 of 30 positions shown; findings below may reference images not displayed]

FINDINGS: The ventricles are normal in size and configuration. There is mild
invagination of CSF into the sella. There is no intracranial mass,
hemorrhage, extra-axial fluid collection, or midline shift.
Gray-white compartments are normal. There is no demonstrable acute
infarct. Orbits appear symmetric and normal bilaterally. Bony
calvarium appears intact. The mastoid air cells are clear.
Visualized paranasal sinuses are clear.
IMPRESSION: Mild invagination of CSF into the sella. The clinical significance
of this finding is uncertain. Study otherwise unremarkable. In
particular, no mass, hemorrhage, or focal gray -white compartment
lesions/ acute appearing infarct.

## 2017-07-09 ENCOUNTER — Other Ambulatory Visit: Payer: Self-pay

## 2017-07-09 ENCOUNTER — Inpatient Hospital Stay (HOSPITAL_COMMUNITY)
Admission: AD | Admit: 2017-07-09 | Discharge: 2017-07-09 | Discharge status: Against Medical Advice | Payer: BLUE CROSS/BLUE SHIELD | Source: Ambulatory Visit | Attending: Obstetrics & Gynecology | Admitting: Obstetrics & Gynecology

## 2017-07-09 ENCOUNTER — Inpatient Hospital Stay (HOSPITAL_COMMUNITY): Payer: BLUE CROSS/BLUE SHIELD

## 2017-07-09 ENCOUNTER — Encounter (HOSPITAL_COMMUNITY): Payer: Self-pay | Admitting: *Deleted

## 2017-07-09 DIAGNOSIS — Z79891 Long term (current) use of opiate analgesic: Secondary | ICD-10-CM | POA: Diagnosis not present

## 2017-07-09 DIAGNOSIS — O99281 Endocrine, nutritional and metabolic diseases complicating pregnancy, first trimester: Secondary | ICD-10-CM | POA: Insufficient documentation

## 2017-07-09 DIAGNOSIS — Z7984 Long term (current) use of oral hypoglycemic drugs: Secondary | ICD-10-CM | POA: Diagnosis not present

## 2017-07-09 DIAGNOSIS — R1032 Left lower quadrant pain: Secondary | ICD-10-CM | POA: Diagnosis not present

## 2017-07-09 DIAGNOSIS — Z3A01 Less than 8 weeks gestation of pregnancy: Secondary | ICD-10-CM | POA: Diagnosis not present

## 2017-07-09 DIAGNOSIS — Z885 Allergy status to narcotic agent status: Secondary | ICD-10-CM | POA: Diagnosis not present

## 2017-07-09 DIAGNOSIS — Z833 Family history of diabetes mellitus: Secondary | ICD-10-CM | POA: Insufficient documentation

## 2017-07-09 DIAGNOSIS — Z9049 Acquired absence of other specified parts of digestive tract: Secondary | ICD-10-CM | POA: Diagnosis not present

## 2017-07-09 DIAGNOSIS — E282 Polycystic ovarian syndrome: Secondary | ICD-10-CM | POA: Insufficient documentation

## 2017-07-09 DIAGNOSIS — O26891 Other specified pregnancy related conditions, first trimester: Secondary | ICD-10-CM | POA: Diagnosis not present

## 2017-07-09 DIAGNOSIS — R109 Unspecified abdominal pain: Secondary | ICD-10-CM

## 2017-07-09 DIAGNOSIS — Z8249 Family history of ischemic heart disease and other diseases of the circulatory system: Secondary | ICD-10-CM | POA: Insufficient documentation

## 2017-07-09 DIAGNOSIS — Z79899 Other long term (current) drug therapy: Secondary | ICD-10-CM | POA: Insufficient documentation

## 2017-07-09 DIAGNOSIS — Z791 Long term (current) use of non-steroidal anti-inflammatories (NSAID): Secondary | ICD-10-CM | POA: Insufficient documentation

## 2017-07-09 DIAGNOSIS — Z9889 Other specified postprocedural states: Secondary | ICD-10-CM | POA: Diagnosis not present

## 2017-07-09 LAB — WET PREP, GENITAL
CLUE CELLS WET PREP: NONE SEEN
Sperm: NONE SEEN
Trich, Wet Prep: NONE SEEN
Yeast Wet Prep HPF POC: NONE SEEN

## 2017-07-09 LAB — URINALYSIS, ROUTINE W REFLEX MICROSCOPIC
BILIRUBIN URINE: NEGATIVE
GLUCOSE, UA: NEGATIVE mg/dL
HGB URINE DIPSTICK: NEGATIVE
Ketones, ur: NEGATIVE mg/dL
Leukocytes, UA: NEGATIVE
Nitrite: NEGATIVE
Protein, ur: NEGATIVE mg/dL
SPECIFIC GRAVITY, URINE: 1.018 (ref 1.005–1.030)
pH: 7 (ref 5.0–8.0)

## 2017-07-09 LAB — POCT PREGNANCY, URINE: PREG TEST UR: POSITIVE — AB

## 2017-07-09 LAB — HCG, QUANTITATIVE, PREGNANCY: hCG, Beta Chain, Quant, S: 492 m[IU]/mL — ABNORMAL HIGH (ref <5)

## 2017-07-09 NOTE — MAU Provider Note (Signed)
Chief Complaint: Abdominal Pain   None    SUBJECTIVE HPI: Jalise Zawistowski is a 27 y.o. G4P0020 at 32w4dwho presents to Maternity Admissions reporting cramping.  Denies bleeding.  Watery discharge.  No itching or odor.  No concerns with discharge.  Hx of 3 loses one 2nd trimester twin lost, and 2 SABS  Location: uterine Quality: mild cramping Severity:4/10 on pain scale Duration: today  Modifying factors: no otcs taken   Past Medical History:  Diagnosis Date  . Abnormal Pap smear   . Anemia   . Bronchitis   . Infertility, female   . Ovarian cyst   . PCOS (polycystic ovarian syndrome)   . UTI (lower urinary tract infection)    OB History  Gravida Para Term Preterm AB Living  4       2 0  SAB TAB Ectopic Multiple Live Births  2            # Outcome Date GA Lbr Len/2nd Weight Sex Delivery Anes PTL Lv  4 Current           3 SAB 2017             Birth Comments: twin gestation, a- SAB @ 19 weeks, B-SAB @21  weeks  2 SAB 2014 [id number]          [facility]  [place]            Past Surgical History:  Procedure Laterality Date  . CERVICAL CERCLAGE    . LAPAROSCOPIC CHOLECYSTECTOMY SINGLE SITE WITH INTRAOPERATIVE CHOLANGIOGRAM N/A 03/01/2017   Procedure: LAPAROSCOPIC CHOLECYSTECTOMY SINGLE SITE WITH INTRAOPERATIVE CHOLANGIOGRAM;  Surgeon: GrMichael BostonMD;  Location: [facility];  Service: General;  Laterality: N/A;  . LAPAROSCOPIC OVARIAN CYSTECTOMY Left 01/07/2015   Procedure: Attempted LAPAROSCOPIC OVARIAN CYSTECTOMY, Mini laparotomy with excision of left ovarian cyst;  Surgeon: PeMora BellmanMD;  [place] [facility];  Service: Gynecology;  Laterality: Left;  Requested 01-26-15 @ 3:30p  . WISDOM TOOTH EXTRACTION     Social History   Socioeconomic History  . Marital status: Single    Spouse name: Not on file  . Number of children: Not on file  . Years of education: Not on file  . Highest education level: Not on file  Social Needs  .  Financial resource strain: Not on file  . Food insecurity - worry: Not on file  . Food insecurity - inability: Not on file  . Transportation needs - medical: Not on file  . Transportation needs - non-medical: Not on file  Occupational History  . Not on file  Tobacco Use  . Smoking status: Never Smoker  . Smokeless tobacco: Never Used  Substance and Sexual Activity  . Alcohol use: No    Comment: occassional   . Drug use: No  . Sexual activity: Yes    Birth control/protection: None    Comment: 06/07/2017  Other Topics Concern  . Not on file  Social History Narrative  . Not on file   Family History  Problem Relation Age of Onset  . Hypertension Paternal Grandmother   . Diabetes Paternal Grandfather   . Other Neg Hx    No current facility-administered medications on file prior to encounter.    Current Outpatient Medications on File Prior to Encounter  Medication Sig Dispense Refill  . acetaminophen (TYLENOL) 500 MG tablet Take 1,000 mg daily as needed by mouth.    . clomiPHENE (CLOMID) 50  MG tablet Take 150 mg by mouth. Take 3 tablets by mouth on days 3 to 7 of cycle.    Marland Kitchen ibuprofen (ADVIL,MOTRIN) 200 MG tablet Take 400 mg daily as needed by mouth.    . letrozole (FEMARA) 2.5 MG tablet Take 2.5 mg by mouth. Take 2 tablets by mouth on days 3 to 7 of cycle  0  . metFORMIN (GLUCOPHAGE) 850 MG tablet Take 1 tablet by mouth daily before supper.     Riley Nearing w/o A Vit-FeFum-FePo-FA (CONCEPT OB) 130-92.4-1 MG CAPS Take 1 capsule by mouth daily. (Patient not taking: Reported on 11/01/2016) 30 capsule 11  . promethazine (PHENERGAN) 25 MG tablet Take 25 mg 2 (two) times daily by mouth.  0  . traMADol (ULTRAM) 50 MG tablet Take 1-2 tablets (50-100 mg total) every 6 (six) hours as needed by mouth for moderate pain or severe pain. 30 tablet 0   Allergies  Allergen Reactions  . Vicodin [Hydrocodone-Acetaminophen] Nausea And Vomiting    Pt states does not cause swelling    I have reviewed  patient's Past Medical Hx, Surgical Hx, Family Hx, Social Hx, medications and allergies.   Review of Systems  Constitutional: Negative.   HENT: Negative.   Eyes: Negative.   Respiratory: Negative.   Cardiovascular: Negative.   Gastrointestinal: Positive for abdominal pain.  Endocrine: Negative.   Genitourinary: Negative.   Musculoskeletal: Negative.   Skin: Negative.   Allergic/Immunologic: Negative.   Neurological: Negative.   Hematological: Negative.   Psychiatric/Behavioral: Negative.     OBJECTIVE Patient Vitals for the past 24 hrs:  BP Temp Temp src Pulse Resp Height Weight  07/09/17 1741 125/69 98.9 F (37.2 C) Oral 91 18 5' 9"  (1.753 m) 83.9 kg (185 lb)   Constitutional: Well-developed, well-nourished female in no acute distress.  Cardiovascular: normal rate Respiratory: normal rate and effort.  GI: Abd soft, non-tender, gravid appropriate for gestational age. Pos BS x 4 MS: Extremities nontender, no edema, normal ROM Neurologic: Alert and oriented x 4.  GU: Neg CVAT.  SPECULUM EXAM: NEFG, thin watery discharge, no blood noted, cervix clean  BIMANUAL: cervix closed; uterus normal size, no adnexal tenderness or masses.  No CMT.  LAB RESULTS Results for orders placed or performed during the hospital encounter of 07/09/17 (from the past 24 hour(s))  Urinalysis, Routine w reflex microscopic     Status: Abnormal   Collection Time: 07/09/17  5:35 PM  Result Value Ref Range   Color, Urine YELLOW YELLOW   APPearance HAZY (A) CLEAR   Specific Gravity, Urine 1.018 1.005 - 1.030   pH 7.0 5.0 - 8.0   Glucose, UA NEGATIVE NEGATIVE mg/dL   Hgb urine dipstick NEGATIVE NEGATIVE   Bilirubin Urine NEGATIVE NEGATIVE   Ketones, ur NEGATIVE NEGATIVE mg/dL   Protein, ur NEGATIVE NEGATIVE mg/dL   Nitrite NEGATIVE NEGATIVE   Leukocytes, UA NEGATIVE NEGATIVE  Pregnancy, urine POC     Status: Abnormal   Collection Time: 07/09/17  6:07 PM  Result Value Ref Range   Preg Test, Ur  POSITIVE (A) NEGATIVE  hCG, quantitative, pregnancy     Status: Abnormal   Collection Time: 07/09/17  7:05 PM  Result Value Ref Range   hCG, Beta Chain, Quant, S 492 (H) <5 mIU/mL   Wet mount negative IMAGING IMPRESSION: 1. No intrauterine pregnancy is visualized. Findings consistent with pregnancy of unknown location, differential of which includes IUP too early to visualize, occult ectopic, and recent failed pregnancy. Trending of HCG recommended  with repeat ultrasound as indicated 2. Moderate anechoic free fluid with asymmetric ovarian size left greater than right; no discrete left adnexal mass seen, however there is hypoechoic area adjacent to the left ovary, possibly representing a small amount of adherent blood or complex fluid; differential considerations include ruptured ovarian cyst or possible ectopic pregnancy. Close clinical follow-up with repeat ultrasound is suggested.   MAU COURSE Orders Placed This Encounter  Procedures  . Wet prep, genital  . US OB Comp Less 14 Wks  . US OB Transvaginal  . Urinalysis, Routine w reflex microscopic  . hCG, quantitative, pregnancy  . Pregnancy, urine POC   No orders of the defined types were placed in this encounter.   MDM Pe done, [place] and cultures sent.  Will review with pt when results back ASSESSMENT 1. Abdominal pain during pregnancy in first trimester     PLAN Pt left AMA before reviewing results with her. At time of seeing pt QBHCG result known so did tell pt to follow up with [facility] on Thursday am for repeat.   Discharge home in stable condition.      Starla Link, CNM 07/09/2017  7:56 PM

## 2017-07-09 NOTE — MAU Note (Signed)
Pt had positive HPt last week. Has been having left lower abd pain on and off x 4 days. Denies any vag bleeding. Reports some watery vag discharge.

## 2017-07-10 LAB — GC/CHLAMYDIA PROBE AMP (~~LOC~~) NOT AT ARMC
Chlamydia: NEGATIVE
Neisseria Gonorrhea: NEGATIVE

## 2017-07-11 IMAGING — US US TRANSVAGINAL NON-OB
1 series · 13 of 25 positions shown · non-contrast
Comparison: 10/22/2014 pelvic sonogram.

CLINICAL DATA: 24-year-old female presenting for follow-up of a
complex left ovarian cyst. History of polycystic ovarian syndrome.
LMP 10/24/2014, day 41 of menstrual cycle.

EXAM:
ULTRASOUND PELVIS TRANSVAGINAL
TECHNIQUE: Transvaginal ultrasound examination of the pelvis was performed
including evaluation of the uterus, ovaries, adnexal regions, and
pelvic cul-de-sac.

[Series 1: us pelvis complete · 39 acquisitions, 13 frames shown]
[im 1/39]
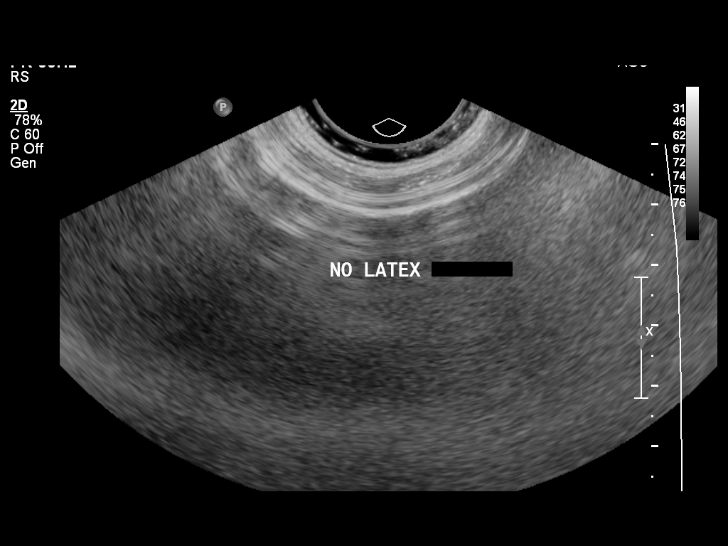
[im 4/39]
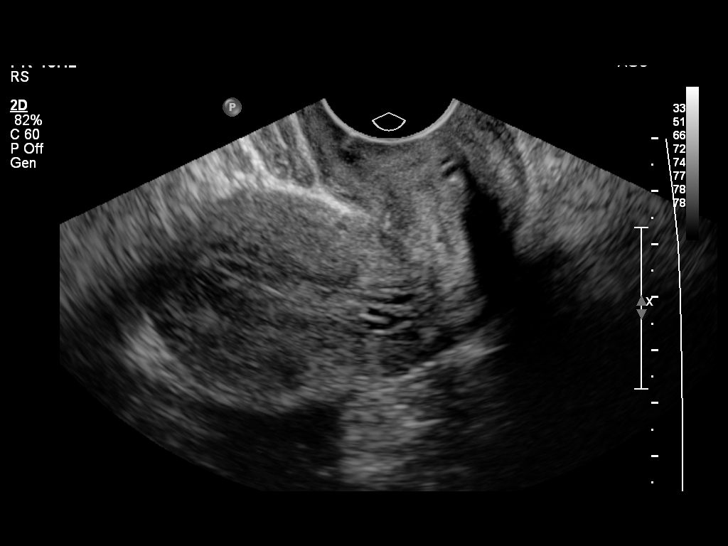
[im 7/39]
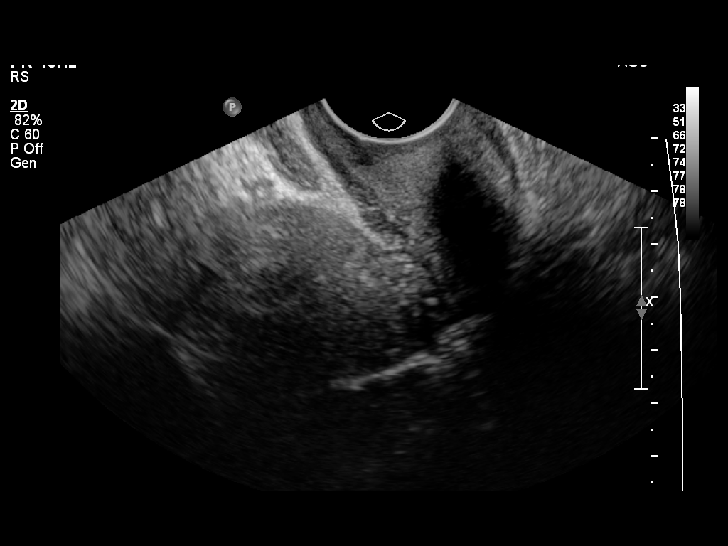
[im 10/39]
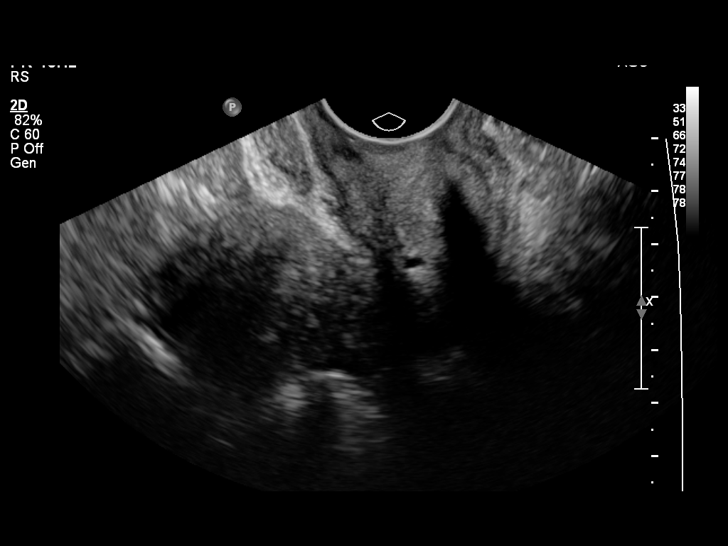
[im 13/39]
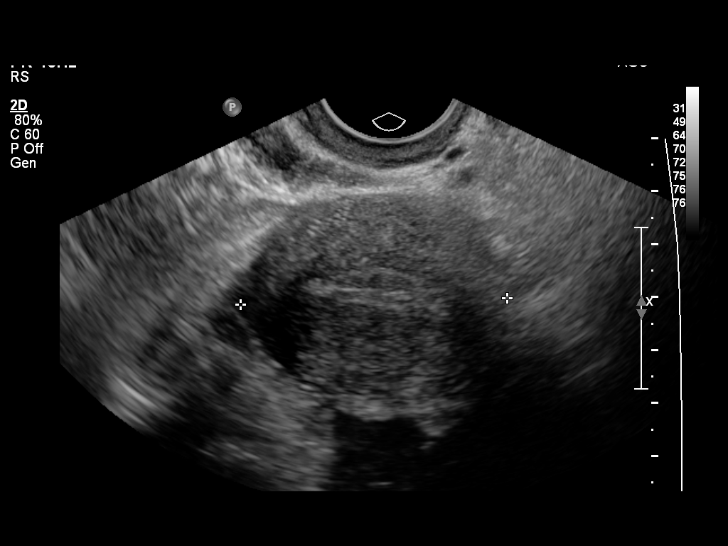
[im 16/39]
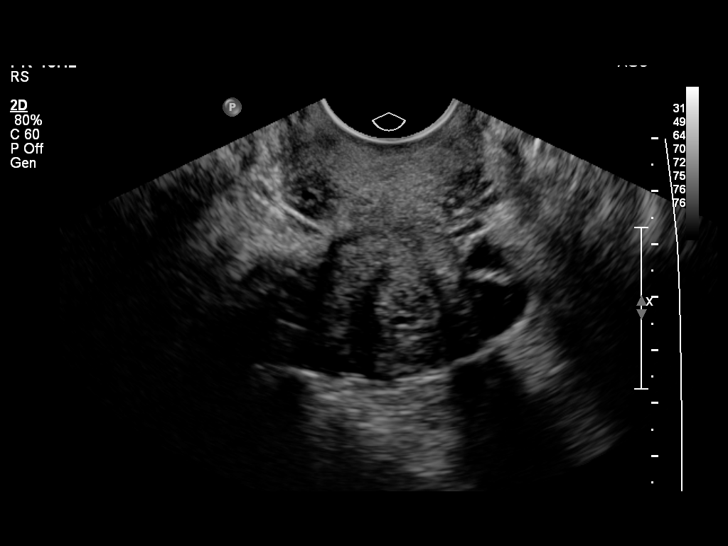
[im 20/39]
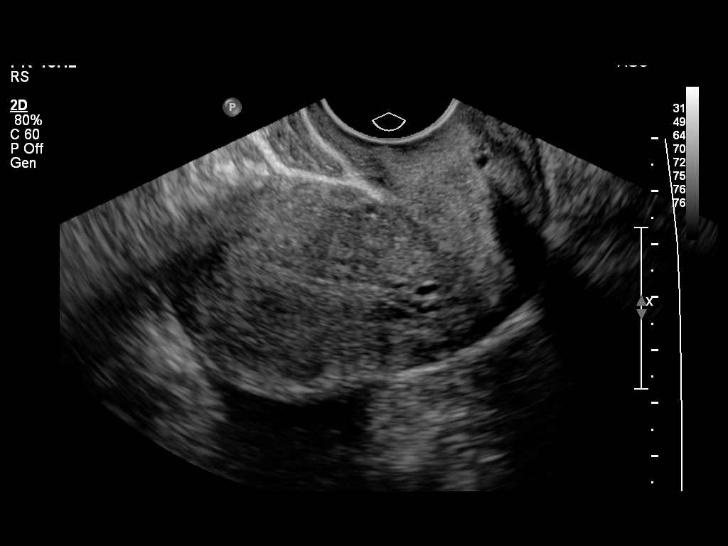
[im 23/39]
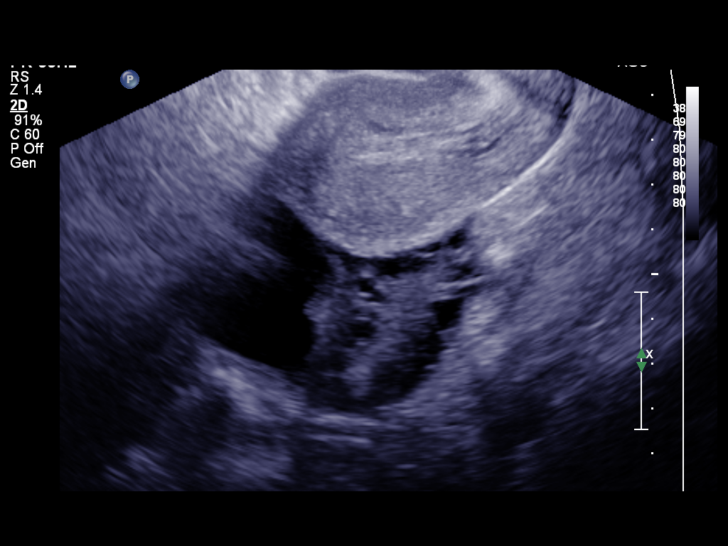
[im 26/39]
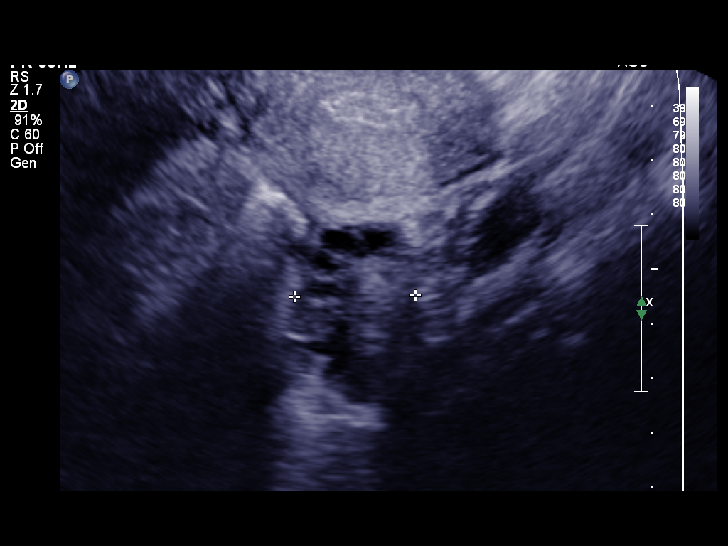
[im 29/39]
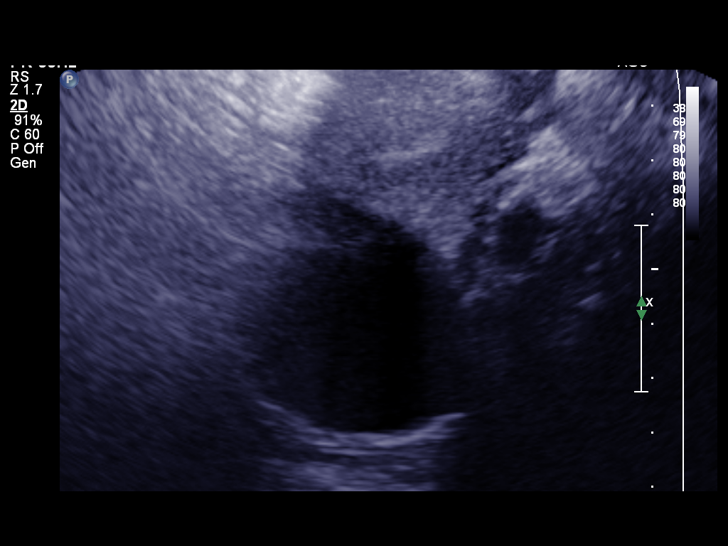
[im 32/39]
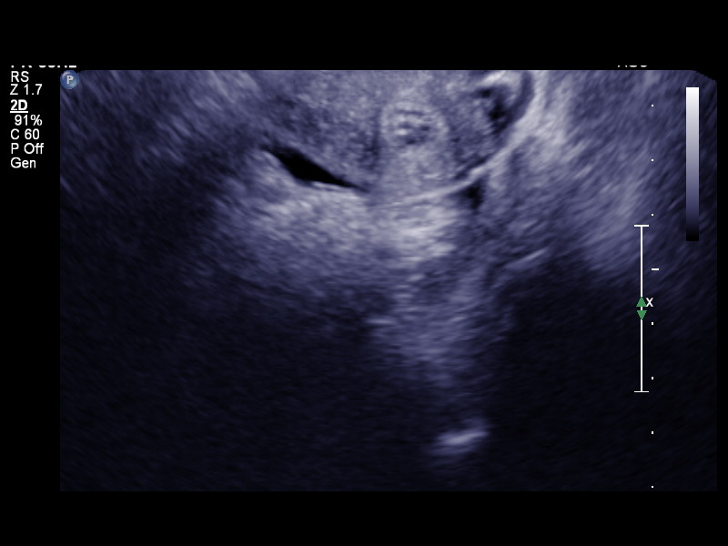
[im 35/39]
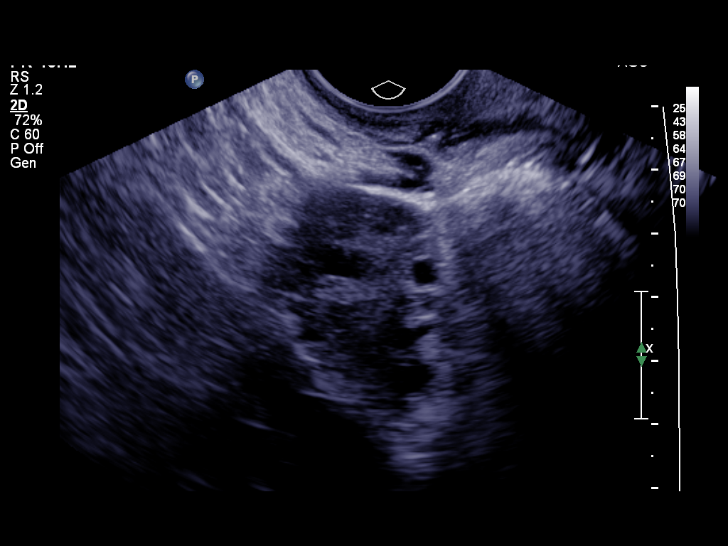
[im 39/39]
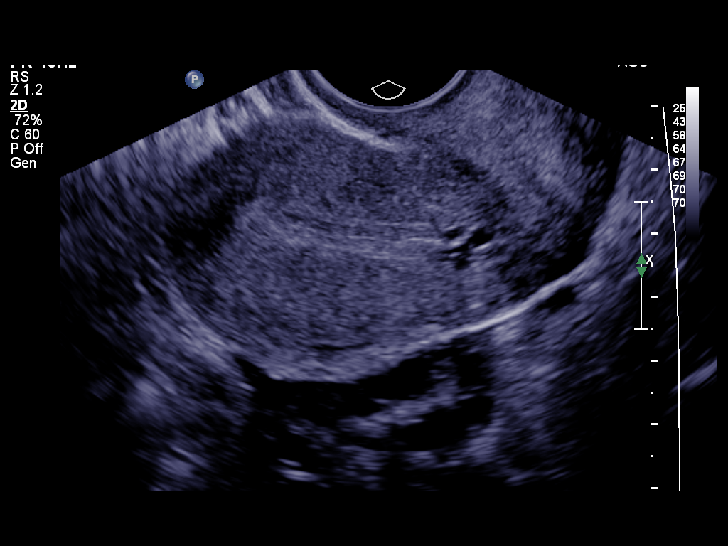

[13 of 25 positions shown; findings below may reference images not displayed]

FINDINGS: Uterus

Measurements: 7.9 x 4.3 x 5.0 cm. The anteverted anteflexed uterus
is normal in size and configuration. There are no uterine fibroids
or other myometrial abnormalities.

Endometrium

Thickness: 8 mm. The bilayer endometrial thickness is within normal
limits for stage of menstrual cycle. No focal endometrial
abnormality or endometrial cavity fluid.

Right ovary

Measurements: 4.2 x 2.7 x 2.6 cm. Normal appearance/no adnexal mass.

Left ovary

Measurements: 5.7 x 2.7 x 2.2 cm. Adjacent to the superior aspect of
the left ovary, there is a complex 5.0 x 4.1 x 4.2 cm cystic
structure, which demonstrates two stable mural nodules measuring 12
mm and 5 mm, which is overall slightly increased in size compared to
the 10/22/2014 scan where it measured 4.5 x 4.5 x 4.2 cm.

Other findings:  No free fluid
IMPRESSION: 1. Slight interval growth of the indeterminate complex 5.0 cm left
adnexal cystic structure with internal mural nodules, which is
located adjacent to the superior aspect of the left ovary. An
exophytic left ovarian neoplasm cannot be excluded. If not resected,
continued close sonographic follow-up is advised.
2. Otherwise normal pelvic sonogram.  No free fluid in the pelvis.
These results will be called to the ordering clinician or
representative by the Radiologist Assistant, and communication
documented in the PACS or zVision Dashboard.

## 2017-07-24 ENCOUNTER — Encounter: Payer: Self-pay | Admitting: *Deleted

## 2017-07-30 LAB — OB RESULTS CONSOLE RUBELLA ANTIBODY, IGM: RUBELLA: IMMUNE

## 2017-07-30 LAB — OB RESULTS CONSOLE ABO/RH

## 2017-07-30 LAB — OB RESULTS CONSOLE HEPATITIS B SURFACE ANTIGEN: Hepatitis B Surface Ag: NEGATIVE

## 2017-07-30 LAB — OB RESULTS CONSOLE HIV ANTIBODY (ROUTINE TESTING): HIV: NONREACTIVE

## 2017-07-30 LAB — OB RESULTS CONSOLE GC/CHLAMYDIA
Chlamydia: NEGATIVE
Gonorrhea: NEGATIVE

## 2017-07-30 LAB — OB RESULTS CONSOLE RPR: RPR: NONREACTIVE

## 2017-07-30 LAB — OB RESULTS CONSOLE ANTIBODY SCREEN: Antibody Screen: NEGATIVE

## 2017-08-24 ENCOUNTER — Other Ambulatory Visit: Payer: Self-pay | Admitting: Obstetrics and Gynecology

## 2017-09-04 ENCOUNTER — Encounter (HOSPITAL_COMMUNITY): Payer: Self-pay | Admitting: *Deleted

## 2017-09-04 ENCOUNTER — Other Ambulatory Visit: Payer: Self-pay

## 2017-09-13 ENCOUNTER — Ambulatory Visit (HOSPITAL_COMMUNITY)
Admission: RE | Admit: 2017-09-13 | Discharge: 2017-09-13 | Disposition: A | Discharge status: Home / Self Care | Payer: BLUE CROSS/BLUE SHIELD | Source: Ambulatory Visit | Attending: Obstetrics and Gynecology | Admitting: Obstetrics and Gynecology

## 2017-09-13 ENCOUNTER — Ambulatory Visit (HOSPITAL_COMMUNITY): Payer: BLUE CROSS/BLUE SHIELD | Admitting: Anesthesiology

## 2017-09-13 ENCOUNTER — Other Ambulatory Visit: Payer: Self-pay

## 2017-09-13 ENCOUNTER — Encounter (HOSPITAL_COMMUNITY)
Admission: RE | Disposition: A | Discharge status: Home / Self Care | Payer: Self-pay | Source: Ambulatory Visit | Attending: Obstetrics and Gynecology

## 2017-09-13 ENCOUNTER — Encounter (HOSPITAL_COMMUNITY): Payer: Self-pay | Admitting: *Deleted

## 2017-09-13 DIAGNOSIS — O3432 Maternal care for cervical incompetence, second trimester: Secondary | ICD-10-CM | POA: Diagnosis present

## 2017-09-13 DIAGNOSIS — Z3A14 14 weeks gestation of pregnancy: Secondary | ICD-10-CM | POA: Diagnosis not present

## 2017-09-13 HISTORY — PX: CERVICAL CERCLAGE: SHX1329

## 2017-09-13 LAB — CBC
HEMATOCRIT: 32.5 % — AB (ref 36.0–46.0)
Hemoglobin: 11 g/dL — ABNORMAL LOW (ref 12.0–15.0)
MCH: 28.9 pg (ref 26.0–34.0)
MCHC: 33.8 g/dL (ref 30.0–36.0)
MCV: 85.5 fL (ref 78.0–100.0)
Platelets: 237 10*3/uL (ref 150–400)
RBC: 3.8 MIL/uL — AB (ref 3.87–5.11)
RDW: 15.7 % — ABNORMAL HIGH (ref 11.5–15.5)
WBC: 5.6 10*3/uL (ref 4.0–10.5)

## 2017-09-13 LAB — TYPE AND SCREEN
ABO/RH(D): A POS
Antibody Screen: NEGATIVE

## 2017-09-13 SURGERY — CERCLAGE, CERVIX, VAGINAL APPROACH
Anesthesia: Spinal | Site: Vagina

## 2017-09-13 MED ORDER — INDOMETHACIN 25 MG PO CAPS: 25.0000 mg | ORAL_CAPSULE | Freq: Four times a day (QID) | ORAL | 0 refills | Status: AC

## 2017-09-13 MED ORDER — LACTATED RINGERS IV SOLN
INTRAVENOUS | Status: DC
  Administered 2017-09-13 (×2): via INTRAVENOUS

## 2017-09-13 MED ORDER — BUPIVACAINE IN DEXTROSE 0.75-8.25 % IT SOLN
INTRATHECAL | Status: DC | PRN
  Administered 2017-09-13: 1.4 mL via INTRATHECAL

## 2017-09-13 MED ORDER — FENTANYL CITRATE (PF) 100 MCG/2ML IJ SOLN: 25.0000 ug | INTRAMUSCULAR | Status: DC | PRN

## 2017-09-13 MED ORDER — INDOMETHACIN 50 MG PO CAPS
50.0000 mg | ORAL_CAPSULE | Freq: Once | ORAL | Status: AC
  Administered 2017-09-13: 50 mg via ORAL
  Filled 2017-09-13: qty 1

## 2017-09-13 MED ORDER — SODIUM CHLORIDE 0.9 % IJ SOLN
INTRAMUSCULAR | Status: DC | PRN
  Administered 2017-09-13: 30 mL via VAGINAL

## 2017-09-13 MED ORDER — ONDANSETRON HCL 4 MG/2ML IJ SOLN
INTRAMUSCULAR | Status: AC
  Filled 2017-09-13: qty 2

## 2017-09-13 MED ORDER — ONDANSETRON HCL 4 MG/2ML IJ SOLN
INTRAMUSCULAR | Status: DC | PRN
  Administered 2017-09-13: 4 mg via INTRAVENOUS

## 2017-09-13 MED ORDER — ONDANSETRON HCL 4 MG/2ML IJ SOLN: 4.0000 mg | Freq: Once | INTRAMUSCULAR | Status: DC | PRN

## 2017-09-13 MED ORDER — SODIUM CHLORIDE 0.9 % IJ SOLN
Freq: Once | INTRAMUSCULAR | Status: DC
  Filled 2017-09-13: qty 1

## 2017-09-13 SURGICAL SUPPLY — 18 items
CANISTER SUCT 3000ML PPV (MISCELLANEOUS) ×3 IMPLANT
GLOVE BIO SURGEON STRL SZ 6.5 (GLOVE) ×2 IMPLANT
GLOVE BIO SURGEONS STRL SZ 6.5 (GLOVE) ×1
GLOVE BIOGEL PI IND STRL 7.0 (GLOVE) ×1 IMPLANT
GLOVE BIOGEL PI INDICATOR 7.0 (GLOVE) ×2
GOWN STRL REUS W/TWL LRG LVL3 (GOWN DISPOSABLE) ×6 IMPLANT
PACK VAGINAL MINOR WOMEN LF (CUSTOM PROCEDURE TRAY) ×3 IMPLANT
PAD OB MATERNITY 4.3X12.25 (PERSONAL CARE ITEMS) ×3 IMPLANT
PAD PREP 24X48 CUFFED NSTRL (MISCELLANEOUS) ×3 IMPLANT
SCOPETTES 8  STERILE (MISCELLANEOUS)
SCOPETTES 8 STERILE (MISCELLANEOUS) IMPLANT
SUT MERSILENE 5MM BP 1 12 (SUTURE) ×3 IMPLANT
SYR BULB IRRIGATION 50ML (SYRINGE) ×3 IMPLANT
TOWEL OR 17X24 6PK STRL BLUE (TOWEL DISPOSABLE) ×6 IMPLANT
TRAY FOLEY W/BAG SLVR 14FR (SET/KITS/TRAYS/PACK) ×3 IMPLANT
TUBING NON-CON 1/4 X 20 CONN (TUBING) ×2 IMPLANT
TUBING NON-CON 1/4 X 20' CONN (TUBING) ×1
YANKAUER SUCT BULB TIP NO VENT (SUCTIONS) ×3 IMPLANT

## 2017-09-13 NOTE — Op Note (Signed)
edure: CERCLAGE CERVICAL   Anesthesia: Spinal   Anesthesiologist: No responsible provider has been recorded for the case.   Attending: Crawford Givens, MD   Assistant:none  Findings: cx about 2-3 cm in length and finger tip dilated  Pathology:none  Fluids: 1000cc  UOP: 305 cc  EBL: minimal  Complications: none  Procedure: The patient was taken to the operating room after the risks benefits and alternatives were discussed with the patient, the patient verbalized understanding and consent signed and witnessed.  The patient was given spinal anesthesia which was tested and adequate.  She was placed in dorsal lithotomy and prepped and draped.  Foley catheter placed.   A weighted speculum was placed in the posterior fourchette. deavers were placed in the vagina to give visualization.  Two ring forceps were placed on the cervix.  A [employer] cervical cerclage was placed with mesiline suture. It was tied with the knot at 12 o clock.   Clindamycin douche was then used.  Hemostasis was noted.  All instruments were removed. The count was correct. The patient was transferred to the recovery room in good condition.

## 2017-09-13 NOTE — Transfer of Care (Signed)
Immediate Anesthesia Transfer of Care Note  Patient: Carla Henderson  Procedure(s) Performed: CERCLAGE CERVICAL (N/A Vagina )  Patient Location: PACU  Anesthesia Type:Spinal  Level of Consciousness: awake, alert  and oriented  Airway & Oxygen Therapy: Patient Spontanous Breathing  Post-op Assessment: Report given to RN and Post -op Vital signs reviewed and stable  Post vital signs: Reviewed and stable  Last Vitals:  Vitals Value Taken Time  BP    Temp    Pulse 83 09/13/2017  1:24 PM  Resp 14 09/13/2017  1:24 PM  SpO2 100 % 09/13/2017  1:24 PM  Vitals shown include unvalidated device data.  Last Pain:  Vitals:   09/13/17 1059  TempSrc: Oral  PainSc: 0-No pain      Patients Stated Pain Goal: 3 (34/74/25 9563)  Complications: No apparent anesthesia complications

## 2017-09-13 NOTE — Anesthesia Preprocedure Evaluation (Addendum)
Anesthesia Evaluation  Patient identified by MRN, date of birth, ID band Patient awake    Reviewed: Allergy & Precautions, NPO status , Patient's Chart, lab work & pertinent test results  Airway Mallampati: II  TM Distance: >3 FB Neck ROM: Full    Dental  (+) Teeth Intact, Dental Advisory Given, Caps,    Pulmonary neg pulmonary ROS,    Pulmonary exam normal breath sounds clear to auscultation       Cardiovascular Exercise Tolerance: Good negative cardio ROS Normal cardiovascular exam Rhythm:Regular Rate:Normal     Neuro/Psych negative neurological ROS     GI/Hepatic negative GI ROS, Neg liver ROS,   Endo/Other  negative endocrine ROS  Renal/GU negative Renal ROS     Musculoskeletal negative musculoskeletal ROS (+)   Abdominal   Peds  Hematology  (+) Blood dyscrasia, anemia , Plt 237k   Anesthesia Other Findings Day of surgery medications reviewed with the patient.  Reproductive/Obstetrics (+) Pregnancy Cervical Incompetense                            Anesthesia Physical Anesthesia Plan  ASA: II  Anesthesia Plan: Spinal   Post-op Pain Management:    Induction:   PONV Risk Score and Plan: 2 and Treatment may vary due to age or medical condition  Airway Management Planned:   Additional Equipment:   Intra-op Plan:   Post-operative Plan:   Informed Consent: I have reviewed the patients History and Physical, chart, labs and discussed the procedure including the risks, benefits and alternatives for the proposed anesthesia with the patient or authorized representative who has indicated his/her understanding and acceptance.   Dental advisory given  Plan Discussed with: CRNA, Anesthesiologist and Surgeon  Anesthesia Plan Comments:         Anesthesia Quick Evaluation

## 2017-09-13 NOTE — Progress Notes (Signed)
FHR 152 in SS this morning.

## 2017-09-13 NOTE — Anesthesia Postprocedure Evaluation (Signed)
Anesthesia Post Note  Patient: Carla Henderson  Procedure(s) Performed: CERCLAGE CERVICAL (N/A Vagina )     Patient location during evaluation: PACU Anesthesia Type: Spinal Level of consciousness: oriented and awake and alert Pain management: pain level controlled Vital Signs Assessment: post-procedure vital signs reviewed and stable Respiratory status: spontaneous breathing, respiratory function stable and patient connected to nasal cannula oxygen Cardiovascular status: blood pressure returned to baseline and stable Postop Assessment: no headache, no backache, no apparent nausea or vomiting, patient able to bend at knees and spinal receding Anesthetic complications: no    Last Vitals:  Vitals:   09/13/17 1500 09/13/17 1515  BP: 119/78 116/70  Pulse: 83 91  Resp: 15 18  Temp:    SpO2: 100% 100%    Last Pain:  Vitals:   09/13/17 1515  TempSrc:   PainSc: 0-No pain   Pain Goal: Patients Stated Pain Goal: 3 (09/13/17 1325)  LLE Motor Response: Purposeful movement(Can slightly bend knees) (09/13/17 1515) LLE Sensation: No sensation (absent) (09/13/17 1515) RLE Motor Response: Purposeful movement(Can slightly bend knees) (09/13/17 1515) RLE Sensation: No sensation (absent) (09/13/17 1515)      Catalina Gravel

## 2017-09-13 NOTE — Anesthesia Procedure Notes (Signed)
Spinal  Patient location during procedure: OR Start time: 09/13/2017 12:39 PM End time: 09/13/2017 12:41 PM Staffing Anesthesiologist: Catalina Gravel, MD Performed: anesthesiologist  Preanesthetic Checklist Completed: patient identified, surgical consent, pre-op evaluation, timeout performed, IV checked, risks and benefits discussed and monitors and equipment checked Spinal Block Patient position: sitting Prep: site prepped and draped and DuraPrep Patient monitoring: continuous pulse ox and blood pressure Approach: midline Location: L3-4 Injection technique: single-shot Needle Needle type: Pencan  Needle gauge: 24 G Assessment Sensory level: T10 Additional Notes Functioning IV was confirmed and monitors were applied. Sterile prep and drape, including hand hygiene, mask and sterile gloves were used. The patient was positioned and the spine was prepped. The skin was anesthetized with lidocaine.  Free flow of clear CSF was obtained prior to injecting local anesthetic into the CSF.  The spinal needle aspirated freely following injection.  The needle was carefully withdrawn.  The patient tolerated the procedure well. Consent was obtained prior to procedure with all questions answered and concerns addressed. Risks including but not limited to bleeding, infection, nerve damage, paralysis, failed block, inadequate analgesia, allergic reaction, high spinal, itching and headache were discussed and the patient wished to proceed.   Hoy Morn, MD

## 2017-09-13 NOTE — Discharge Instructions (Signed)
Cervical Cerclage, Care After This sheet gives you information about how to care for yourself after your procedure. Your health care provider may also give you more specific instructions. If you have problems or questions, contact your health care provider. What can I expect after the procedure? After your procedure, it is common to have:  Cramping in your abdomen.  Mucus discharge for several days.  Painful urination (dysuria).  Small drops of blood coming from your vagina (spotting).  Follow these instructions at home:  Follow instructions from your health care provider about bed rest, if this applies. You may need to be on bed rest for up to 3 days.  Take over-the-counter and prescription medicines only as told by your health care provider.  Do not drive or use heavy machinery while taking prescription pain medicine.  Keep track of your vaginal discharge and watch for any changes. If you notice changes, tell your health care provider.  Avoid physical activities and exercise until your health care provider approves. Ask your health care provider what activities are safe for you.  Until your health care provider approves: ? Do not douche. ? Do not have sexual intercourse.  Keep all pre-birth (prenatal) visits and all follow-up visits as told by your health care provider. This is important. You will probably have weekly visits to have your cervix checked, and you may need an ultrasound. Contact a health care provider if:  You have abnormal or bad-smelling vaginal discharge, such as clots.  You develop a rash on your skin. This may look like redness and swelling.  You become light-headed or feel like you are going to faint.  You have abdominal pain that does not get better with medicine.  You have persistent nausea or vomiting. Get help right away if:  You have vaginal bleeding that is heavier or more frequent than spotting.  You are leaking fluid or have a gush of fluid  from your vagina (your water breaks).  You have a fever or chills.  You faint.  You have uterine contractions. These may feel like: ? A back ache. ? Lower abdominal pain. ? Mild cramps, similar to menstrual cramps. ? Tightening or pressure in your abdomen.  You think that your baby is not moving as much as usual, or you cannot feel your baby move.  You have chest pain.  You have shortness of breath. This information is not intended to replace advice given to you by your health care provider. Make sure you discuss any questions you have with your health care provider. Document Released: 01/29/2013 Document Revised: 12/08/2015 Document Reviewed: 11/12/2015 Elsevier Interactive Patient Education  2018 [place] [facility] Home Care Instructions  Activity: Get plenty of rest for the remainder of the day. A responsible individual must stay with you for 24 hours following the procedure.  For the next 24 hours, DO NOT: -Drive a car -Paediatric nurse -Drink alcoholic beverages -Take any medication unless instructed by your physician -Make any legal decisions or sign important papers.  Meals: Start with liquid foods such as gelatin or soup. Progress to regular foods as tolerated. Avoid greasy, spicy, heavy foods. If nausea and/or vomiting occur, drink only clear liquids until the nausea and/or vomiting subsides. Call your physician if vomiting continues.

## 2017-09-13 NOTE — H&P (Signed)
[name] is [birth year]  Patient's last menstrual period was 06/07/2017. [redacted]w[redacted]d Pt presents with Cervical Incompetense.  She presents for cerclage.  She has a history of 18 week loss  Pregnancy complicated by See above PMHX  Past Medical History:  Diagnosis Date  . Abnormal Pap smear   . Anemia   . Bronchitis    x 1 time  . Infertility, female   . Ovarian cyst   . PCOS (polycystic ovarian syndrome)   . UTI (lower urinary tract infection)     PSURG HX  Past Surgical History:  Procedure Laterality Date  . CERVICAL CERCLAGE    . CHOLECYSTECTOMY    . LAPAROSCOPIC CHOLECYSTECTOMY SINGLE SITE WITH INTRAOPERATIVE CHOLANGIOGRAM N/A 03/01/2017   Procedure: LAPAROSCOPIC CHOLECYSTECTOMY SINGLE SITE WITH INTRAOPERATIVE CHOLANGIOGRAM;  Surgeon: GMichael Boston MD;  Location: [facility];  Service: General;  Laterality: N/A;  . LAPAROSCOPIC OVARIAN CYSTECTOMY Left 01/07/2015   Procedure: Attempted LAPAROSCOPIC OVARIAN CYSTECTOMY, Mini laparotomy with excision of left ovarian cyst;  Surgeon: PMora Bellman MD;  [facility];  Service: Gynecology;  Laterality: Left;  Requested 01-26-15 @ 3:30p  . WISDOM TOOTH EXTRACTION      Fam HX  Family History  Problem Relation Age of Onset  . Hypertension Paternal Grandmother   . Diabetes Paternal Grandfather   . Other Neg Hx     Soc HX  Social History   Socioeconomic History  . Marital status: Single    Spouse name: Not on file  . Number of children: Not on file  . Years of education: Not on file  . Highest education level: Not on file  Occupational History  . Not on file  Social Needs  . Financial resource strain: Not on file  . Food insecurity:    Worry: Not on file    Inability: Not on file  . Transportation needs:    Medical: Not on file    Non-medical: Not on file  Tobacco Use  . Smoking status: Never Smoker  . Smokeless tobacco: Never Used  Substance and Sexual Activity  . Alcohol use: Not Currently    Comment:  occassional   . Drug use: No  . Sexual activity: Yes    Birth control/protection: None    Comment: approx [redacted] wks gestation  Lifestyle  . Physical activity:    Days per week: Not on file    Minutes per session: Not on file  . Stress: Not on file  Relationships  . Social connections:    Talks on phone: Not on file    Gets together: Not on file    Attends religious service: Not on file    Active member of club or organization: Not on file    Attends meetings of clubs or organizations: Not on file    Relationship status: Not on file  . Intimate partner violence:    Fear of current or ex partner: Not on file    Emotionally abused: Not on file    Physically abused: Not on file    Forced sexual activity: Not on file  Other Topics Concern  . Not on file  Social History Narrative  . Not on file    ROS  Review of Systems - Negative except above  [place] variable position.  AFI normal   EFWcx 4 cm in length     Gen [facility]  IN NAD CV RRR LUNGS CTAB ABD Gravid soft NT GU long and closed EXT no calf tenderness B  A/P: IUP 43w0dPt for cerclage R&B reviewed Blood type a pos

## 2017-09-14 ENCOUNTER — Encounter (HOSPITAL_COMMUNITY): Payer: Self-pay | Admitting: Obstetrics and Gynecology

## 2017-09-19 ENCOUNTER — Other Ambulatory Visit: Payer: Self-pay

## 2017-09-19 ENCOUNTER — Inpatient Hospital Stay (HOSPITAL_COMMUNITY)
Admission: AD | Admit: 2017-09-19 | Discharge: 2017-09-19 | Disposition: A | Discharge status: Home / Self Care | Payer: BLUE CROSS/BLUE SHIELD | Source: Ambulatory Visit | Attending: Obstetrics & Gynecology | Admitting: Obstetrics & Gynecology

## 2017-09-19 ENCOUNTER — Encounter (HOSPITAL_COMMUNITY): Payer: Self-pay

## 2017-09-19 DIAGNOSIS — M545 Low back pain: Secondary | ICD-10-CM | POA: Diagnosis not present

## 2017-09-19 DIAGNOSIS — Z9049 Acquired absence of other specified parts of digestive tract: Secondary | ICD-10-CM | POA: Diagnosis not present

## 2017-09-19 DIAGNOSIS — Z8249 Family history of ischemic heart disease and other diseases of the circulatory system: Secondary | ICD-10-CM | POA: Insufficient documentation

## 2017-09-19 DIAGNOSIS — Z3A14 14 weeks gestation of pregnancy: Secondary | ICD-10-CM | POA: Diagnosis not present

## 2017-09-19 DIAGNOSIS — O3432 Maternal care for cervical incompetence, second trimester: Secondary | ICD-10-CM | POA: Insufficient documentation

## 2017-09-19 DIAGNOSIS — E282 Polycystic ovarian syndrome: Secondary | ICD-10-CM | POA: Diagnosis not present

## 2017-09-19 DIAGNOSIS — O99282 Endocrine, nutritional and metabolic diseases complicating pregnancy, second trimester: Secondary | ICD-10-CM | POA: Diagnosis not present

## 2017-09-19 DIAGNOSIS — Z833 Family history of diabetes mellitus: Secondary | ICD-10-CM | POA: Diagnosis not present

## 2017-09-19 DIAGNOSIS — R109 Unspecified abdominal pain: Secondary | ICD-10-CM | POA: Diagnosis not present

## 2017-09-19 DIAGNOSIS — O26892 Other specified pregnancy related conditions, second trimester: Secondary | ICD-10-CM | POA: Insufficient documentation

## 2017-09-19 DIAGNOSIS — Z885 Allergy status to narcotic agent status: Secondary | ICD-10-CM | POA: Diagnosis not present

## 2017-09-19 DIAGNOSIS — O3482 Maternal care for other abnormalities of pelvic organs, second trimester: Secondary | ICD-10-CM | POA: Insufficient documentation

## 2017-09-19 DIAGNOSIS — Z9889 Other specified postprocedural states: Secondary | ICD-10-CM | POA: Insufficient documentation

## 2017-09-19 DIAGNOSIS — N949 Unspecified condition associated with female genital organs and menstrual cycle: Secondary | ICD-10-CM

## 2017-09-19 LAB — URINALYSIS, ROUTINE W REFLEX MICROSCOPIC
BILIRUBIN URINE: NEGATIVE
Glucose, UA: NEGATIVE mg/dL
Hgb urine dipstick: NEGATIVE
KETONES UR: NEGATIVE mg/dL
Leukocytes, UA: NEGATIVE
NITRITE: NEGATIVE
PH: 6 (ref 5.0–8.0)
PROTEIN: NEGATIVE mg/dL
Specific Gravity, Urine: 1.012 (ref 1.005–1.030)

## 2017-09-19 LAB — WET PREP, GENITAL
Clue Cells Wet Prep HPF POC: NONE SEEN
Sperm: NONE SEEN
TRICH WET PREP: NONE SEEN
Yeast Wet Prep HPF POC: NONE SEEN

## 2017-09-19 NOTE — MAU Provider Note (Addendum)
History     CSN: [id number]  Arrival date and time: 09/19/17 1824   None     Chief Complaint  Patient presents with  . Abdominal Pain  . Back Pain   27 y.o. G3P0 at 14 weeks 6 days presents for lower left abdominal and lower left back pain starting today at 1615. States it is mostly a 3/10 pain but occasionally a 6/10. Denies vaginal bleeding, vaginal irritation or pruritis, urinary urgency or dysuria, and contractions. Patient has cerclage placed 09/13/17. Patient experiencing leukorrhea, thin and yellowish in color.    OB History    Gravida  3   Para      Term      Preterm      AB  2   Living  0     SAB  2   TAB      Ectopic      Multiple      Live Births            Patient has history of 18 week loss with previous pregnancy, cerclage placed 09/13/17.   Past Medical History:  Diagnosis Date  . Abnormal Pap smear   . Anemia   . Bronchitis    x 1 time  . Infertility, female   . Ovarian cyst   . PCOS (polycystic ovarian syndrome)   . UTI (lower urinary tract infection)     Past Surgical History:  Procedure Laterality Date  . CERVICAL CERCLAGE    . CERVICAL CERCLAGE N/A 09/13/2017   Procedure: CERCLAGE CERVICAL;  Surgeon: Crawford Givens, MD;  Location: [facility];  Service: Gynecology;  Laterality: N/A;  . CHOLECYSTECTOMY    . LAPAROSCOPIC CHOLECYSTECTOMY SINGLE SITE WITH INTRAOPERATIVE CHOLANGIOGRAM N/A 03/01/2017   Procedure: LAPAROSCOPIC CHOLECYSTECTOMY SINGLE SITE WITH INTRAOPERATIVE CHOLANGIOGRAM;  Surgeon: Michael Boston, MD;  Location: [facility];  Service: General;  Laterality: N/A;  . LAPAROSCOPIC OVARIAN CYSTECTOMY Left 01/07/2015   Procedure: Attempted LAPAROSCOPIC OVARIAN CYSTECTOMY, Mini laparotomy with excision of left ovarian cyst;  Surgeon: Mora Bellman, MD;  Location: [facility];  Service: Gynecology;  Laterality: Left;  Requested 01-26-15 @ 3:30p  . WISDOM TOOTH EXTRACTION      Family History  Problem Relation Age of Onset  . Hypertension  Paternal Grandmother   . Diabetes Paternal Grandfather   . Other Neg Hx     Social History   Tobacco Use  . Smoking status: Never Smoker  . Smokeless tobacco: Never Used  Substance Use Topics  . Alcohol use: Not Currently    Comment: occassional   . Drug use: No    Allergies:  Allergies  Allergen Reactions  . Vicodin [Hydrocodone-Acetaminophen] Nausea And Vomiting and Other (See Comments)    Pt states does not cause swelling    No medications prior to admission.    Review of Systems  Gastrointestinal: Positive for abdominal pain.  Genitourinary: Positive for flank pain. Negative for dysuria, frequency, urgency, vaginal bleeding and vaginal discharge.  Musculoskeletal: Positive for back pain.  All other systems reviewed and are negative.  Physical Exam   Blood pressure 121/76, pulse 92, temperature 98.6 F (37 C), temperature source Oral, resp. rate 18, weight 88.7 kg (195 lb 8 oz), last menstrual period 06/07/2017, SpO2 99 %, unknown if currently breastfeeding.  Physical Exam  Constitutional: She is oriented to person, place, and time. She appears well-developed and well-nourished.  HENT:  Head: Normocephalic and atraumatic.  Eyes: Pupils are equal, round, and reactive to light.  Cardiovascular: Normal  rate, regular rhythm and normal heart sounds.  Respiratory: Effort normal and breath sounds normal.  GI: Soft. Bowel sounds are normal.  Genitourinary: Uterus normal. Vaginal discharge found.  Musculoskeletal: Normal range of motion.  Neurological: She is alert and oriented to person, place, and time.  Skin: Skin is warm and dry.  Psychiatric: She has a normal mood and affect. Her behavior is normal. Judgment and thought content normal.   FHTs audible with doppler, 150s On speculum exam whitish yellow thin discharge noted, wet prep negative. Cervix is long, closed, and firm. No bleeding or cervical dilation noted.   MAU Course  MDM Patient is afebrile, no signs  of vaginal or urinary infection, wet prep and urinalysis negative. Rule out vaginal or urinary tract infection. Pain unaffected by abdominal palpation. Rule out abdominal organ inflammation. No signs of cervical dilation. Patient not experiencing contractions. Rule out SAB. Diagnosis is round ligament pain which is consistent with the patient's unilateral signs and symptoms. Offered patient extra strength Tylenol, patient denies need. Discussed nonpharmacologic management of round ligament pain and information printed and provided to patient on round ligament pain.   Assessment and Plan  27 y.o. G3P0 at 14 weeks 6 days Audible FHTs History of 18 week loss,  cerclage placed 09/13/17 Cerclage in place Round liagament pain  Consulted Dr. Alesia Richards  Tylenol OTC PRN mild to moderate pain  Discussed nonpharmacologic management and information printed and provided to patient on round liagament pain Keep appointment tomorrow with Dr. Charlesetta Garibaldi for post-op visit    Marikay Alar 09/19/2017, 9:31 PM

## 2017-09-19 NOTE — MAU Note (Signed)
Cerclage placed last Thurs.  Has been doing ok.  Within the last hour, started having this aching pain, pain in lower abd and low back. Hasn't been doing anything until today, went out shopping this morning, lives on the [address]nd level- so did the stairs.   Has post op appt tomorrow.

## 2017-09-19 NOTE — Discharge Instructions (Signed)
Round Ligament Pain The round ligament is a cord of muscle and tissue that helps to support the uterus. It can become a source of pain during pregnancy if it becomes stretched or twisted as the baby grows. The pain usually begins in the second trimester of pregnancy, and it can come and go until the baby is delivered. It is not a serious problem, and it does not cause harm to the baby. Round ligament pain is usually a short, sharp, and pinching pain, but it can also be a dull, lingering, and aching pain. The pain is felt in the lower side of the abdomen or in the groin. It usually starts deep in the groin and moves up to the outside of the hip area. Pain can occur with:  A sudden change in position.  Rolling over in bed.  Coughing or sneezing.  Physical activity.  Follow these instructions at home: Watch your condition for any changes. Take these steps to help with your pain:  When the pain starts, relax. Then try: ? Sitting down. ? Flexing your knees up to your abdomen. ? Lying on your side with one pillow under your abdomen and another pillow between your legs. ? Sitting in a warm bath for 15-20 minutes or until the pain goes away.  Take over-the-counter and prescription medicines only as told by your health care provider.  Move slowly when you sit and stand.  Avoid long walks if they cause pain.  Stop or lessen your physical activities if they cause pain.  Contact a health care provider if:  Your pain does not go away with treatment.  You feel pain in your back that you did not have before.  Your medicine is not helping. Get help right away if:  You develop a fever or chills.  You develop uterine contractions.  You develop vaginal bleeding.  You develop nausea or vomiting.  You develop diarrhea.  You have pain when you urinate. This information is not intended to replace advice given to you by your health care provider. Make sure you discuss any questions you have  with your health care provider. Document Released: 01/18/2008 Document Revised: 09/16/2015 Document Reviewed: 06/17/2014 Elsevier Interactive Patient Education  [employer].

## 2017-10-17 ENCOUNTER — Inpatient Hospital Stay (HOSPITAL_COMMUNITY)
Admission: AD | Admit: 2017-10-17 | Discharge: 2017-10-17 | Disposition: A | Discharge status: Home / Self Care | Payer: BLUE CROSS/BLUE SHIELD | Source: Ambulatory Visit | Attending: Obstetrics & Gynecology | Admitting: Obstetrics & Gynecology

## 2017-10-17 ENCOUNTER — Encounter (HOSPITAL_COMMUNITY): Payer: Self-pay

## 2017-10-17 DIAGNOSIS — Z3A18 18 weeks gestation of pregnancy: Secondary | ICD-10-CM | POA: Diagnosis not present

## 2017-10-17 DIAGNOSIS — O1202 Gestational edema, second trimester: Secondary | ICD-10-CM | POA: Insufficient documentation

## 2017-10-17 DIAGNOSIS — Z3482 Encounter for supervision of other normal pregnancy, second trimester: Secondary | ICD-10-CM

## 2017-10-17 NOTE — MAU Note (Signed)
Pt here with c/o swelling in leg and foot on left side. She wonders if it has something to do with her 17-P shot. She received that yesterday about 1000 in her left arm. Denies any bleeding or leaking or pain.

## 2017-10-17 NOTE — MAU Provider Note (Signed)
History     CSN: [id number]  Arrival date and time: 10/17/17 8182   First Provider Initiated Contact with Patient 10/17/17 1956      Chief Complaint  Patient presents with  . Leg Swelling   Patient reports she had her 17-P in left arm and noted afterward her left leg looked a bit more swollen than her right leg. She was concerned it was from the 17-P and presented to MAU. Denies pain and redness in either leg.    OB History    Gravida  3   Para      Term      Preterm      AB  2   Living  0     SAB  2   TAB      Ectopic      Multiple      Live Births              Past Medical History:  Diagnosis Date  . Abnormal Pap smear   . Anemia   . Bronchitis    x 1 time  . Infertility, female   . Ovarian cyst   . PCOS (polycystic ovarian syndrome)   . UTI (lower urinary tract infection)     Past Surgical History:  Procedure Laterality Date  . CERVICAL CERCLAGE    . CERVICAL CERCLAGE N/A 09/13/2017   Procedure: CERCLAGE CERVICAL;  Surgeon: Crawford Givens, MD;  Location: [facility];  Service: Gynecology;  Laterality: N/A;  . CHOLECYSTECTOMY    . LAPAROSCOPIC CHOLECYSTECTOMY SINGLE SITE WITH INTRAOPERATIVE CHOLANGIOGRAM N/A 03/01/2017   Procedure: LAPAROSCOPIC CHOLECYSTECTOMY SINGLE SITE WITH INTRAOPERATIVE CHOLANGIOGRAM;  Surgeon: Michael Boston, MD;  Location: [facility];  Service: General;  Laterality: N/A;  . LAPAROSCOPIC OVARIAN CYSTECTOMY Left 01/07/2015   Procedure: Attempted LAPAROSCOPIC OVARIAN CYSTECTOMY, Mini laparotomy with excision of left ovarian cyst;  Surgeon: Mora Bellman, MD;  Location: [facility];  Service: Gynecology;  Laterality: Left;  Requested 01-26-15 @ 3:30p  . WISDOM TOOTH EXTRACTION      Family History  Problem Relation Age of Onset  . Hypertension Paternal Grandmother   . Diabetes Paternal Grandfather   . Other Neg Hx     Social History   Tobacco Use  . Smoking status: Never Smoker  . Smokeless tobacco: Never Used  Substance Use  Topics  . Alcohol use: Not Currently    Comment: occassional   . Drug use: No    Allergies:  Allergies  Allergen Reactions  . Vicodin [Hydrocodone-Acetaminophen] Nausea And Vomiting and Other (See Comments)    Pt states does not cause swelling    No medications prior to admission.    Review of Systems  Musculoskeletal:       Denies pain or swelling to bilateral lower extremities   Skin: Negative for color change.  All other systems reviewed and are negative.  Physical Exam   Blood pressure 131/82, pulse 93, temperature 99.3 F (37.4 C), temperature source Oral, resp. rate 18, height 5' 9"  (1.753 m), weight 93.4 kg (206 lb), last menstrual period 06/07/2017, SpO2 100 %, unknown if currently breastfeeding.  Physical Exam  Nursing note and vitals reviewed. Constitutional: She is oriented to person, place, and time. She appears well-developed and well-nourished.  HENT:  Head: Normocephalic.  Eyes: Pupils are equal, round, and reactive to light.  Musculoskeletal: Normal range of motion. She exhibits edema. She exhibits no tenderness.  Mild +1 non-pitting edema to lower extremities bilaterally, no redness noted, no tenderness.  Homan's sign negative. Bilateral calf diameter equal at 15.25inches.   Neurological: She is alert and oriented to person, place, and time.  Skin: Skin is warm and dry. No erythema.  Psychiatric: She has a normal mood and affect. Her behavior is normal. Judgment and thought content normal.    MAU Course  Procedures  MDM Exam ruled out likelihood of DVT. Bilateral 1+ nonpitting edema typical for pregnancy. No pain in either leg, no redness, and calf measurements were equal bilaterally. Negative Homan's sign. Educated patient on normalcy of swelling in pregnancy and safety of 17-P. Warning signs of DVT discussed.   Assessment and Plan  27 y.o. G3P0 at 70w6dMild edema bilaterally Discharge home with education  EMarikay Alar6/26/2019, 9:18 PM

## 2017-10-24 ENCOUNTER — Other Ambulatory Visit (HOSPITAL_COMMUNITY): Payer: Self-pay | Admitting: Obstetrics and Gynecology

## 2017-10-24 DIAGNOSIS — Z3686 Encounter for antenatal screening for cervical length: Secondary | ICD-10-CM

## 2017-10-24 DIAGNOSIS — Z3689 Encounter for other specified antenatal screening: Secondary | ICD-10-CM

## 2017-10-26 ENCOUNTER — Encounter (HOSPITAL_COMMUNITY): Payer: Self-pay

## 2017-10-28 ENCOUNTER — Inpatient Hospital Stay (HOSPITAL_COMMUNITY): Payer: BLUE CROSS/BLUE SHIELD

## 2017-10-28 ENCOUNTER — Inpatient Hospital Stay (HOSPITAL_COMMUNITY)
Admission: AD | Admit: 2017-10-28 | Discharge: 2017-10-28 | Disposition: A | Discharge status: Home / Self Care | Payer: BLUE CROSS/BLUE SHIELD | Source: Ambulatory Visit | Attending: Obstetrics and Gynecology | Admitting: Obstetrics and Gynecology

## 2017-10-28 ENCOUNTER — Other Ambulatory Visit: Payer: Self-pay

## 2017-10-28 ENCOUNTER — Encounter (HOSPITAL_COMMUNITY): Payer: Self-pay

## 2017-10-28 DIAGNOSIS — Z3A20 20 weeks gestation of pregnancy: Secondary | ICD-10-CM | POA: Insufficient documentation

## 2017-10-28 DIAGNOSIS — O3432 Maternal care for cervical incompetence, second trimester: Secondary | ICD-10-CM | POA: Diagnosis not present

## 2017-10-28 DIAGNOSIS — R109 Unspecified abdominal pain: Secondary | ICD-10-CM | POA: Diagnosis present

## 2017-10-28 DIAGNOSIS — O26892 Other specified pregnancy related conditions, second trimester: Secondary | ICD-10-CM

## 2017-10-28 DIAGNOSIS — O26899 Other specified pregnancy related conditions, unspecified trimester: Secondary | ICD-10-CM

## 2017-10-28 LAB — URINALYSIS, ROUTINE W REFLEX MICROSCOPIC
Bilirubin Urine: NEGATIVE
Glucose, UA: NEGATIVE mg/dL
Hgb urine dipstick: NEGATIVE
KETONES UR: NEGATIVE mg/dL
LEUKOCYTES UA: NEGATIVE
NITRITE: NEGATIVE
PROTEIN: NEGATIVE mg/dL
Specific Gravity, Urine: 1.001 — ABNORMAL LOW (ref 1.005–1.030)
pH: 8 (ref 5.0–8.0)

## 2017-10-28 LAB — WET PREP, GENITAL
CLUE CELLS WET PREP: NONE SEEN
Sperm: NONE SEEN
TRICH WET PREP: NONE SEEN
YEAST WET PREP: NONE SEEN

## 2017-10-28 LAB — AMNISURE RUPTURE OF MEMBRANE (ROM) NOT AT ARMC: AMNISURE: NEGATIVE

## 2017-10-28 LAB — FETAL FIBRONECTIN: Fetal Fibronectin: NEGATIVE

## 2017-10-28 MED ORDER — ACETAMINOPHEN 325 MG PO TABS
650.0000 mg | ORAL_TABLET | Freq: Four times a day (QID) | ORAL | Status: DC | PRN
  Administered 2017-10-28: 650 mg via ORAL
  Filled 2017-10-28: qty 2

## 2017-10-28 NOTE — Discharge Instructions (Signed)
Preterm Labor and Birth Information Pregnancy normally lasts 39-41 weeks. Preterm labor is when labor starts early. It starts before you have been pregnant for 37 whole weeks. What are the risk factors for preterm labor? Preterm labor is more likely to occur in women who:  Have an infection while pregnant.  Have a cervix that is short.  Have gone into preterm labor before.  Have had surgery on their cervix.  Are younger than age 28.  Are older than age 83.  Are African American.  Are pregnant with two or more babies.  Take street drugs while pregnant.  Smoke while pregnant.  Do not gain enough weight while pregnant.  Got pregnant right after another pregnancy.  What are the symptoms of preterm labor? Symptoms of preterm labor include:  Cramps. The cramps may feel like the cramps some women get during their period. The cramps may happen with watery poop (diarrhea).  Pain in the belly (abdomen).  Pain in the lower back.  Regular contractions or tightening. It may feel like your belly is getting tighter.  Pressure in the lower belly that seems to get stronger.  More fluid (discharge) leaking from the vagina. The fluid may be watery or bloody.  Water breaking.  Why is it important to notice signs of preterm labor? Babies who are born early may not be fully developed. They have a higher chance for:  Long-term heart problems.  Long-term lung problems.  Trouble controlling body systems, like breathing.  Bleeding in the brain.  A condition called cerebral palsy.  Learning difficulties.  Death.  These risks are highest for babies who are born before 62 weeks of pregnancy. How is preterm labor treated? Treatment depends on:  How long you were pregnant.  Your condition.  The health of your baby.  Treatment may involve:  Having a stitch (suture) placed in your cervix. When you give birth, your cervix opens so the baby can come out. The stitch keeps the  cervix from opening too soon.  Staying at the hospital.  Taking or getting medicines, such as: ? Hormone medicines. ? Medicines to stop contractions. ? Medicines to help the babys lungs develop. ? Medicines to prevent your baby from having cerebral palsy.  What should I do if I am in preterm labor? If you think you are going into labor too soon, call your doctor right away. How can I prevent preterm labor?  Do not use any tobacco products. ? Examples of these are cigarettes, chewing tobacco, and e-cigarettes. ? If you need help quitting, ask your doctor.  Do not use street drugs.  Do not use any medicines unless you ask your doctor if they are safe for you.  Talk with your doctor before taking any herbal supplements.  Make sure you gain enough weight.  Watch for infection. If you think you might have an infection, get it checked right away.  If you have gone into preterm labor before, tell your doctor. This information is not intended to replace advice given to you by your health care provider. Make sure you discuss any questions you have with your health care provider. Document Released: 07/07/2008 Document Revised: 09/21/2015 Document Reviewed: 09/01/2015 Elsevier Interactive Patient Education  2018 [employer].

## 2017-10-28 NOTE — MAU Provider Note (Signed)
History     CSN: [id number]  Arrival date and time: 10/28/17 1616   None     Chief Complaint  Patient presents with  . Abdominal Pain  . Back Pain   Patient began having intermittent mild cramping in her lower abdomen and back this morning. She states it is not worsened by movement occurs when she isn't moving. She states the cramping lasts for 10 seconds and comes irregularly, maybe every 10 minutes. It began bilaterally but is now primarily left sided. Patient has a history significant for PROM, SAB, preterm birth, and cerclage placement for cervical insufficiency. She denies any vaginal pruritis or pain, or urinary symptoms. Verbalizes she has had clear vaginal discharge but believes its related to the vaginal progesterone she is taking. She is also taking 17P. Patient states she has not been well-hydrated for the past several days.   Patient having cervical length U/S exams in office, most recently 10/23/17: BREECH, CERVIX DEMONSTRATES "BEAKING" OF 3.2 CM, CERCLAGE IS NOTED, CLOSED CERVICAL LENGTH IS 2.49 CM. (PREVIOUSLY 5.3-5.7 CM, DYNAMIC ON LAST WEEK'S [place]). ANTERIOR PLACENTA, LIMITED FACIAL ANATOMY.    OB History    Gravida  3   Para      Term      Preterm      AB  2   Living  0     SAB  2   TAB      Ectopic      Multiple      Live Births              Past Medical History:  Diagnosis Date  . Abnormal Pap smear   . Anemia   . Bronchitis    x 1 time  . Infertility, female   . Ovarian cyst   . PCOS (polycystic ovarian syndrome)   . UTI (lower urinary tract infection)     Past Surgical History:  Procedure Laterality Date  . CERVICAL CERCLAGE    . CERVICAL CERCLAGE N/A 09/13/2017   Procedure: CERCLAGE CERVICAL;  Surgeon: Crawford Givens, MD;  Location: [facility];  Service: Gynecology;  Laterality: N/A;  . CHOLECYSTECTOMY    . LAPAROSCOPIC CHOLECYSTECTOMY SINGLE SITE WITH INTRAOPERATIVE CHOLANGIOGRAM N/A 03/01/2017   Procedure: LAPAROSCOPIC CHOLECYSTECTOMY  SINGLE SITE WITH INTRAOPERATIVE CHOLANGIOGRAM;  Surgeon: Michael Boston, MD;  Location: [facility];  Service: General;  Laterality: N/A;  . LAPAROSCOPIC OVARIAN CYSTECTOMY Left 01/07/2015   Procedure: Attempted LAPAROSCOPIC OVARIAN CYSTECTOMY, Mini laparotomy with excision of left ovarian cyst;  Surgeon: Mora Bellman, MD;  Location: [facility];  Service: Gynecology;  Laterality: Left;  Requested 01-26-15 @ 3:30p  . WISDOM TOOTH EXTRACTION      Family History  Problem Relation Age of Onset  . Hypertension Paternal Grandmother   . Diabetes Paternal Grandfather   . Other Neg Hx     Social History   Tobacco Use  . Smoking status: Never Smoker  . Smokeless tobacco: Never Used  Substance Use Topics  . Alcohol use: Not Currently    Comment: occassional   . Drug use: No    Allergies:  Allergies  Allergen Reactions  . Vicodin [Hydrocodone-Acetaminophen] Nausea And Vomiting and Other (See Comments)    Pt states does not cause swelling    Medications Prior to Admission  Medication Sig Dispense Refill Last Dose  . Prenat w/o A Vit-FeFum-FePo-FA (CONCEPT OB) 130-92.4-1 MG CAPS Take 1 capsule by mouth daily. (Patient not taking: Reported on 11/01/2016) 30 capsule 11 Not Taking at Unknown time  Review of Systems  Gastrointestinal: Positive for abdominal pain.  Genitourinary: Positive for vaginal discharge. Negative for frequency, urgency, vaginal bleeding and vaginal pain.  Musculoskeletal: Positive for back pain.  All other systems reviewed and are negative.  Physical Exam   Vitals:   10/28/17 1643  BP: 119/75  Pulse: 97  Resp: 18  Temp: 98.6 F (37 C)  TempSrc: Oral  Weight: 93.9 kg (207 lb)  Height: 5' 9"  (1.753 m)   Results for orders placed or performed during the hospital encounter of 10/28/17 (from the past 24 hour(s))  Urinalysis, Routine w reflex microscopic     Status: Abnormal   Collection Time: 10/28/17  4:30 PM  Result Value Ref Range   Color, Urine STRAW (A) YELLOW     APPearance CLEAR CLEAR   Specific Gravity, Urine 1.001 (L) 1.005 - 1.030   pH 8.0 5.0 - 8.0   Glucose, UA NEGATIVE NEGATIVE mg/dL   Hgb urine dipstick NEGATIVE NEGATIVE   Bilirubin Urine NEGATIVE NEGATIVE   Ketones, ur NEGATIVE NEGATIVE mg/dL   Protein, ur NEGATIVE NEGATIVE mg/dL   Nitrite NEGATIVE NEGATIVE   Leukocytes, UA NEGATIVE NEGATIVE    Physical Exam  Nursing note and vitals reviewed. Constitutional: She is oriented to person, place, and time. She appears well-developed and well-nourished.  HENT:  Head: Normocephalic.  Eyes: Pupils are equal, round, and reactive to light.  Cardiovascular: Normal rate, regular rhythm and normal heart sounds.  Respiratory: Effort normal and breath sounds normal.  Genitourinary: Uterus normal. Cervix exhibits no discharge. Vaginal discharge found.    Musculoskeletal: Normal range of motion.  Neurological: She is alert and oriented to person, place, and time.  Skin: Skin is warm and dry.  Psychiatric: She has a normal mood and affect. Her behavior is normal. Judgment and thought content normal.   Vaginal exam: 0/0%/high, cerclage visualized.   Fetal heart beat audible by doppler: 164 BPM  Cervical length via ultrasound: 3.1cm   MAU Course  Procedures FFN Amnisure Wet Prep Ultrasound for cervical length   MDM Patient with history remarkable for PROM and SAB and preterm birth, as well as cervical insufficiency. Ruled out UTI with urinalysis. Ruled out vaginal infection with wet prep. FFN negative, amnisure negative. Cervical length on today's ultrasound was consistent with ultrasound done in office on 10/23/17. Unilateral irregular cramping not consistent with contractions or preterm labor. Tylenol given for discomfort and PO fluids encouraged in MAU and at home. Patient stated Tylenol improved pain. Suspect uterine irritability due to recent poor hydration. Will discharge home. Preterm labor precautions given, patient to follow-up with  [facility] MD at scheduled appointment on 10/30/17.   Assessment and Plan  27 y.o. G3P0 at 20w3dHistory of cervical insufficiency and preterm birth on Makena and vaginal progesterone  Preterm labor precautions given and information provided  Tylenol PRN for cramping   EMarikay Alar7/10/2017, 5:43 PM

## 2017-10-28 NOTE — MAU Note (Signed)
Pt. States lower abdomen and lower back began hurting this AM.  Pt denies bleeding, d/c but has some leaking. Pt states it may be from her progesterone treatments.

## 2017-10-31 ENCOUNTER — Ambulatory Visit (HOSPITAL_COMMUNITY): Payer: BLUE CROSS/BLUE SHIELD

## 2017-11-02 ENCOUNTER — Ambulatory Visit (HOSPITAL_COMMUNITY)
Admission: RE | Admit: 2017-11-02 | Discharge: 2017-11-02 | Disposition: A | Discharge status: Home / Self Care | Payer: BLUE CROSS/BLUE SHIELD | Source: Ambulatory Visit | Attending: Obstetrics and Gynecology | Admitting: Obstetrics and Gynecology

## 2017-11-02 ENCOUNTER — Other Ambulatory Visit (HOSPITAL_COMMUNITY): Payer: Self-pay | Admitting: Obstetrics and Gynecology

## 2017-11-02 ENCOUNTER — Encounter (HOSPITAL_COMMUNITY): Payer: Self-pay

## 2017-11-02 DIAGNOSIS — O3432 Maternal care for cervical incompetence, second trimester: Secondary | ICD-10-CM

## 2017-11-02 DIAGNOSIS — Z3686 Encounter for antenatal screening for cervical length: Secondary | ICD-10-CM | POA: Insufficient documentation

## 2017-11-02 DIAGNOSIS — Z3A21 21 weeks gestation of pregnancy: Secondary | ICD-10-CM | POA: Insufficient documentation

## 2017-11-02 DIAGNOSIS — Z363 Encounter for antenatal screening for malformations: Secondary | ICD-10-CM

## 2017-11-02 DIAGNOSIS — O09292 Supervision of pregnancy with other poor reproductive or obstetric history, second trimester: Secondary | ICD-10-CM | POA: Insufficient documentation

## 2017-11-02 DIAGNOSIS — Z3689 Encounter for other specified antenatal screening: Secondary | ICD-10-CM

## 2017-11-16 ENCOUNTER — Encounter (HOSPITAL_COMMUNITY): Payer: Self-pay | Admitting: *Deleted

## 2017-11-16 ENCOUNTER — Inpatient Hospital Stay (HOSPITAL_COMMUNITY)
Admission: AD | Admit: 2017-11-16 | Discharge: 2017-11-16 | Disposition: A | Discharge status: Home / Self Care | Payer: BLUE CROSS/BLUE SHIELD | Source: Ambulatory Visit | Attending: Obstetrics and Gynecology | Admitting: Obstetrics and Gynecology

## 2017-11-16 DIAGNOSIS — O99282 Endocrine, nutritional and metabolic diseases complicating pregnancy, second trimester: Secondary | ICD-10-CM | POA: Diagnosis not present

## 2017-11-16 DIAGNOSIS — Z3A23 23 weeks gestation of pregnancy: Secondary | ICD-10-CM | POA: Diagnosis not present

## 2017-11-16 DIAGNOSIS — O3402 Maternal care for unspecified congenital malformation of uterus, second trimester: Secondary | ICD-10-CM | POA: Diagnosis not present

## 2017-11-16 DIAGNOSIS — Z8632 Personal history of gestational diabetes: Secondary | ICD-10-CM | POA: Diagnosis not present

## 2017-11-16 DIAGNOSIS — O26612 Liver and biliary tract disorders in pregnancy, second trimester: Secondary | ICD-10-CM | POA: Insufficient documentation

## 2017-11-16 DIAGNOSIS — Z9049 Acquired absence of other specified parts of digestive tract: Secondary | ICD-10-CM | POA: Insufficient documentation

## 2017-11-16 DIAGNOSIS — O98512 Other viral diseases complicating pregnancy, second trimester: Secondary | ICD-10-CM | POA: Diagnosis not present

## 2017-11-16 DIAGNOSIS — Z87440 Personal history of urinary (tract) infections: Secondary | ICD-10-CM | POA: Diagnosis not present

## 2017-11-16 DIAGNOSIS — Q51810 Arcuate uterus: Secondary | ICD-10-CM | POA: Diagnosis not present

## 2017-11-16 DIAGNOSIS — B009 Herpesviral infection, unspecified: Secondary | ICD-10-CM

## 2017-11-16 DIAGNOSIS — K7581 Nonalcoholic steatohepatitis (NASH): Secondary | ICD-10-CM | POA: Insufficient documentation

## 2017-11-16 DIAGNOSIS — E282 Polycystic ovarian syndrome: Secondary | ICD-10-CM | POA: Diagnosis not present

## 2017-11-16 DIAGNOSIS — N898 Other specified noninflammatory disorders of vagina: Secondary | ICD-10-CM | POA: Diagnosis present

## 2017-11-16 DIAGNOSIS — Z885 Allergy status to narcotic agent status: Secondary | ICD-10-CM | POA: Insufficient documentation

## 2017-11-16 LAB — URINALYSIS, ROUTINE W REFLEX MICROSCOPIC
BILIRUBIN URINE: NEGATIVE
Glucose, UA: NEGATIVE mg/dL
HGB URINE DIPSTICK: NEGATIVE
KETONES UR: 5 mg/dL — AB
Nitrite: NEGATIVE
Protein, ur: 30 mg/dL — AB
Specific Gravity, Urine: 1.027 (ref 1.005–1.030)
pH: 6 (ref 5.0–8.0)

## 2017-11-16 MED ORDER — VALACYCLOVIR HCL 1 G PO TABS: 1000.0000 mg | ORAL_TABLET | Freq: Two times a day (BID) | ORAL | 0 refills | Status: AC

## 2017-11-16 NOTE — MAU Note (Signed)
Yesterday my vagina was hurting alittle but I'm using progesterone suppositories and wear panty liners so thought liner irritated vagina. Tonight I looked at vagina and saw tiny bumps on vulva. Not really itching just mostly irritation. Has cerclage and did not want to use anything for yeast until was checked

## 2017-11-16 NOTE — MAU Provider Note (Signed)
Chief Complaint:  vaginal irritation  Patient Active Problem List   Diagnosis Date Noted  . Polycystic ovaries 03/01/2017  . Elevated LFTs 03/01/2017  . Acute calculous cholecystitis s/p lap cholecystectomy 03/01/2017 03/01/2017  . Steatohepatitis, nonalcoholic 41/63/8453  . Symptomatic cholelithiasis 02/28/2017  . 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 H/O incompetent cervix, currently pregnant--hx loss of 18-20 week twins 2017, cerclage then 11/01/2016  . Infertility, female 11/01/2016  . History of gestational diabetes 11/01/2016  . 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 History of gestational diabetes mellitus 11/01/2016  . Overweight 08/17/2015    HPI: Amandalynn Pitz is a 27 y.o. G3P0020 at 52w1dwho presents to maternity admissions reporting small round white spot on vagina that feeling burning, and painful to touch, started x 1 day ago, never happened before, no h/o HSV. Pt endorses feeling irritation ion vaginal external area, mild burning on external while in shower or urinating, [facility] , Been with same partner for 10 years, was tested for gc/c in office at [facility] and neg results. Pt denies anyother s/sx, no n, v, d, rashes, fever, travel, no fever or body aches. Pt endorses having h/o PTD and currently on progesterone vaginal inserts and menkina.    Denies contractions, leakage of fluid or vaginal bleeding. Good fetal movement.   Pregnancy Course:   Past Medical History:  Diagnosis Date  . Abnormal Pap smear   . Anemia   . Bronchitis    x 1 time  . Infertility, female   . Ovarian cyst   . PCOS (polycystic ovarian syndrome)   . UTI (lower urinary tract infection)    OB History  Gravida Para Term Preterm AB Living  3       2 0  SAB TAB Ectopic Multiple Live Births  2            # Outcome Date GA Lbr Len/2nd Weight Sex Delivery Anes PTL Lv  3 Current           2 SAB 2017             Birth Comments: twin gestation, a- SAB @ 19 weeks, B-SAB @21   weeks  1 SAB 2014 8106w0d          [facility]   Past Surgical History:  Procedure Laterality Date  . CERVICAL CERCLAGE    . CERVICAL CERCLAGE N/A 09/13/2017   Procedure: CERCLAGE CERVICAL;  Surgeon: DiCrawford GivensMD;  [facility];  Service: Gynecology;  Laterality: N/A;  . CHOLECYSTECTOMY    . LAPAROSCOPIC CHOLECYSTECTOMY SINGLE SITE WITH INTRAOPERATIVE CHOLANGIOGRAM N/A 03/01/2017   Procedure: LAPAROSCOPIC CHOLECYSTECTOMY SINGLE SITE WITH INTRAOPERATIVE CHOLANGIOGRAM;  Surgeon: GrMichael BostonMD;  [facility];  Service: General;  Laterality: N/A;  . LAPAROSCOPIC OVARIAN CYSTECTOMY Left 01/07/2015   Procedure: Attempted LAPAROSCOPIC OVARIAN CYSTECTOMY, Mini laparotomy with excision of left ovarian cyst;  Surgeon: PeMora BellmanMD;  [facility];  Service: Gynecology;  Laterality: Left;  Requested 01-26-15 @ 3:30p  . WISDOM TOOTH EXTRACTION     Family History  Problem Relation Age of Onset  . Hypertension Paternal Grandmother   . Diabetes Paternal Grandfather   . Other Neg Hx    Social History   Tobacco Use  . Smoking status: Never Smoker  . Smokeless tobacco: Never Used  Substance Use Topics  . Alcohol use: Not Currently    Comment: occassional   . Drug use: No  Allergies  Allergen Reactions  . Vicodin [Hydrocodone-Acetaminophen] Nausea And Vomiting and Other (See Comments)    Pt states does not cause swelling   Medications Prior to Admission  Medication Sig Dispense Refill Last Dose  . hydroxyprogesterone caproate (MAKENA) 250 mg/mL OIL injection Inject 250 mg into the muscle once.   Taking  . IRON PO Take by mouth.   Taking  . Prenat w/o A Vit-FeFum-FePo-FA (CONCEPT OB) 130-92.4-1 MG CAPS Take 1 capsule by mouth daily. (Patient not taking: Reported on 11/02/2017) 30 capsule 11 Not Taking  . Prenatal Vit-Fe Fumarate-FA (PRENATAL VITAMIN PO) Take by mouth.   Taking  . progesterone 200 MG SUPP Place 200 mg vaginally at  bedtime.   Taking    I have reviewed patient's Past Medical Hx, Surgical Hx, Family Hx, Social Hx, medications and allergies.   ROS:  Review of Systems  All other systems reviewed and are negative.   Physical Exam   Patient Vitals for the past 24 hrs:  BP Temp Pulse Resp SpO2 Height Weight  11/16/17 2103 124/77 - (!) 103 - - - -  11/16/17 2015 126/75 98.4 F (36.9 C) (!) 106 18 100 % 5' 9"  (1.753 m) 99.3 kg (219 lb)   Constitutional: Well-developed, well-nourished female in no acute distress.  Cardiovascular: normal rate Respiratory: normal effort GI: Abd soft, non-tender, gravid appropriate for gestational age. Pos BS x 4 MS: Extremities nontender, no edema, normal ROM Neurologic: Alert and oriented x 4.  GU: Neg CVAT.  Pelvic: Deferred internal exam. External genitalia has x4 ulcerated, smalll circular lesion on left labia minora. Mild tenderness to touch, no drainage, normal physiological vaginal discharge on external vagina, No erythema.   NST: FHR baseline 150 bpm, Variability: moderate, Accelerations:present, Decelerations:  Absent= Cat 1/Reactive and reassuring for gestational age.  UC:   none    Labs: No results found for this or any previous visit (from the past 24 hour(s)).  Imaging:   [place] Mfm Ob Limited  Result Date: 10/28/2017 ----------------------------------------------------------------------  OBSTETRICS REPORT                      (Signed Final 10/28/2017 10:19 pm) ---------------------------------------------------------------------- Patient Info  ID #:       [id number]                          D.O.B.:  1990-10-19 (27 yrs)  Name:       Arthor Captain                  Visit Date: 10/28/2017 06:19 pm ---------------------------------------------------------------------- Performed By  Performed By:     Valda Favia          Ref. Address:      [facility]                                                              Obstetrics &  Gynecology                                                              [place]                                                              [place], [place]  Attending:        Tama High MD        Location:          [facility]  Referred By:      Crawford Givens                    MD ---------------------------------------------------------------------- Orders   #  Description                                 Code   1  [place] MFM OB LIMITED                           [phone]   2  [place] MFM OB TRANSVAGINAL                      65537.4  ----------------------------------------------------------------------   #  Ordered By               Order #        Accession #    Episode #   1  Ranee Gosselin              [id number]      [id number]     [id number]   [place]              [id number]      [id number]     [id number]  ---------------------------------------------------------------------- Indications   [redacted] weeks gestation of pregnancy  Z3A.20   Encounter for cervical length                   Z36.86   Cervical incompetence, second trimester         O34.32   (cerclage placed at [redacted] weeks gestation)   Abdominal pain in pregnancy                     O99.89  ---------------------------------------------------------------------- OB History  Gravidity:    3         Term:   0        Prem:   0         SAB:   2  TOP:          0       Ectopic:  0        Living: 0 ---------------------------------------------------------------------- Fetal Evaluation  Num Of Fetuses:     1  Fetal Heart         142  Rate(bpm):  Cardiac Activity:   Observed  Presentation:       Cephalic  Placenta:           Anterior, above cervical os  P. Cord Insertion:  Visualized, central  Amniotic Fluid   AFI FV:      Subjectively within normal limits                              Largest Pocket(cm)                              7.3 ---------------------------------------------------------------------- Gestational Age  LMP:           20w 3d        Date:  06/07/17                 EDD:    03/14/18  Best:          Hyacinth Meeker 3d     Det. By:  LMP  (06/07/17)          EDD:    03/14/18 ---------------------------------------------------------------------- Cervix Uterus Adnexa  Cervix  Length:            3.1  cm.  Measured transvaginally. Cerclage visualized. ---------------------------------------------------------------------- Impression  Patient  was evaluated for lower abdominal and back pain.  She had cerclage procdure in May 2019.  Amniotic fluid is normal and good fetal heart activity is seen.  On transvaginal ultrasound, the cervix measures 3.1 cm,  which is normal. The cerclage is in place. ---------------------------------------------------------------------- Recommendations  Follow-up scans as clinically indicated. ----------------------------------------------------------------------                  Tama High, MD Electronically Signed Final Report   10/28/2017 10:19 pm ----------------------------------------------------------------------   MAU Course: Orders Placed This Encounter  Procedures  . Hsv Culture And Typing  . Urinalysis, Routine w reflex microscopic  . Diet - low sodium heart healthy  . Increase activity slowly  . Call MD for:  . Call MD for:  temperature >100.4  . Call MD for:  persistant nausea and vomiting  . Call MD for:  severe uncontrolled pain  . Call MD for:  redness, tenderness, or signs of infection (pain, swelling, redness, odor or green/yellow discharge around incision site)  . Call MD for:  difficulty breathing, headache or visual disturbances  . Call MD  for:  hives  . Call MD for:  persistant dizziness or light-headedness  . Call MD for:  extreme fatigue  . (HEART FAILURE  PATIENTS) Call MD:  Anytime you have any of the following symptoms: 1) 3 pound weight gain in 24 hours or 5 pounds in 1 week 2) shortness of breath, with or without a dry hacking cough 3) swelling in the hands, feet or stomach 4) if you have to sleep on extra pillows at night in order to breathe.  . Discharge patient Discharge disposition: 01-Home or Self Care; Discharge patient date: 11/16/2017   Meds ordered this encounter  Medications  . valACYclovir (VALTREX) 1000 MG tablet    Sig: Take 1 tablet (1,000 mg total) by mouth 2 (two) times daily for 10 days.    Dispense:  4 tablet    Refill:  0    Order Specific Question:   Supervising Provider    Answer:   Everett Graff [[id number]]    MDM: PE, NST, LAB for HSV, discharge home.   Assessment: Zerenity Bowron is a 27 y.o. G3P0020 at 49w1dwith h/o PTD present with the following: 1. HSV infection   Pt stable, cat 1 fetal strip, pt discharged home in stable condition.   Plan: Start Valtrex BID script given, will wait for pending HSV culture if positive will need valtrex prophylactics starting at 36 weeks. Continue normal managent with vaginal inserts and mekina.  Discharge home in stable condition.  Labor precautions and fetal kick counts [facility] week([facility]).   Specialty:  Obstetrics and Gynecology Contact information: [phone]         Allergies as of 11/16/2017      Reactions   Vicodin [hydrocodone-acetaminophen] Nausea And Vomiting, Other (See Comments)   Pt states does not cause swelling      Medication List    TAKE these medications   CONCEPT OB 130-92.4-1 MG Caps Take 1 capsule by mouth daily.   hydroxyprogesterone caproate 250 mg/mL Oil injection Commonly known as:  MAKENA Inject 250 mg into the muscle once.   IRON PO Take by mouth.   PRENATAL VITAMIN PO Take by mouth.    progesterone 200 MG Supp Place 200 mg vaginally at bedtime.   valACYclovir 1000 MG tablet Commonly known as:  VALTREX Take 1 tablet (1,000 mg total) by mouth 2 (two) times daily for 10 days.       JWalter Reed National Military Medical CenterNP-C, CGlen [place] JGumbranch FNorth Carolina7/26/2019 9:19 PM

## 2017-11-19 LAB — HSV CULTURE AND TYPING

## 2017-12-07 ENCOUNTER — Other Ambulatory Visit: Payer: Self-pay

## 2017-12-07 ENCOUNTER — Inpatient Hospital Stay (HOSPITAL_COMMUNITY)
Admission: AD | Admit: 2017-12-07 | Discharge: 2017-12-07 | Disposition: A | Discharge status: Home / Self Care | Payer: BLUE CROSS/BLUE SHIELD | Source: Ambulatory Visit | Attending: Obstetrics & Gynecology | Admitting: Obstetrics & Gynecology

## 2017-12-07 ENCOUNTER — Inpatient Hospital Stay (HOSPITAL_BASED_OUTPATIENT_CLINIC_OR_DEPARTMENT_OTHER): Payer: BLUE CROSS/BLUE SHIELD

## 2017-12-07 ENCOUNTER — Encounter (HOSPITAL_COMMUNITY): Payer: Self-pay | Admitting: *Deleted

## 2017-12-07 DIAGNOSIS — O479 False labor, unspecified: Secondary | ICD-10-CM

## 2017-12-07 DIAGNOSIS — Z3A26 26 weeks gestation of pregnancy: Secondary | ICD-10-CM

## 2017-12-07 DIAGNOSIS — O4700 False labor before 37 completed weeks of gestation, unspecified trimester: Secondary | ICD-10-CM

## 2017-12-07 DIAGNOSIS — O3432 Maternal care for cervical incompetence, second trimester: Secondary | ICD-10-CM

## 2017-12-07 DIAGNOSIS — O3430 Maternal care for cervical incompetence, unspecified trimester: Secondary | ICD-10-CM

## 2017-12-07 DIAGNOSIS — R109 Unspecified abdominal pain: Secondary | ICD-10-CM | POA: Diagnosis present

## 2017-12-07 DIAGNOSIS — Z3686 Encounter for antenatal screening for cervical length: Secondary | ICD-10-CM | POA: Diagnosis not present

## 2017-12-07 LAB — URINALYSIS, ROUTINE W REFLEX MICROSCOPIC
Bilirubin Urine: NEGATIVE
Glucose, UA: NEGATIVE mg/dL
Ketones, ur: NEGATIVE mg/dL
Nitrite: NEGATIVE
Protein, ur: NEGATIVE mg/dL
Specific Gravity, Urine: 1.003 — ABNORMAL LOW (ref 1.005–1.030)
pH: 7 (ref 5.0–8.0)

## 2017-12-07 LAB — CBC
HCT: 29.3 % — ABNORMAL LOW (ref 36.0–46.0)
Hemoglobin: 10.3 g/dL — ABNORMAL LOW (ref 12.0–15.0)
MCH: 31.7 pg (ref 26.0–34.0)
MCHC: 35.2 g/dL (ref 30.0–36.0)
MCV: 90.2 fL (ref 78.0–100.0)
Platelets: 229 10*3/uL (ref 150–400)
RBC: 3.25 MIL/uL — ABNORMAL LOW (ref 3.87–5.11)
RDW: 14.5 % (ref 11.5–15.5)
WBC: 6.7 10*3/uL (ref 4.0–10.5)

## 2017-12-07 MED ORDER — NIFEDIPINE 10 MG PO CAPS
10.0000 mg | ORAL_CAPSULE | ORAL | Status: AC
  Administered 2017-12-07 (×3): 10 mg via ORAL
  Filled 2017-12-07 (×3): qty 1

## 2017-12-07 MED ORDER — LACTATED RINGERS IV BOLUS
1000.0000 mL | Freq: Once | INTRAVENOUS | Status: AC
Start: 2017-12-07 — End: 2017-12-07
  Administered 2017-12-07: 1000 mL via INTRAVENOUS

## 2017-12-07 MED ORDER — LACTATED RINGERS IV SOLN: INTRAVENOUS | Status: DC

## 2017-12-07 NOTE — Discharge Instructions (Signed)

## 2017-12-07 NOTE — MAU Note (Signed)
Pt presents with c/o lower abdominal and back pain that began today @ 1530.  Denies LOF or VB.  Reports +FM.

## 2017-12-07 NOTE — MAU Provider Note (Addendum)
History     CSN: [id number]  Arrival date & time 12/07/17  1704   None     Chief Complaint  Patient presents with  . Abdominal Pain  . Back Pain    Carla Henderson [id number]  @ [redacted]w[redacted]d  presents with contractions since 1530  Past Medical History:  Diagnosis Date  . Abnormal Pap smear   . Anemia   . Bronchitis    x 1 time  . Infertility, female   . Ovarian cyst   . PCOS (polycystic ovarian syndrome)   . UTI (lower urinary tract infection)     Past Surgical History:  Procedure Laterality Date  . CERVICAL CERCLAGE    . CERVICAL CERCLAGE N/Carla 09/13/2017   Procedure: CERCLAGE CERVICAL;  Surgeon: DCrawford Givens MD;  [facility];  Service: Gynecology;  Laterality: N/Carla;  . CHOLECYSTECTOMY    . LAPAROSCOPIC CHOLECYSTECTOMY SINGLE SITE WITH INTRAOPERATIVE CHOLANGIOGRAM N/Carla 03/01/2017   Procedure: LAPAROSCOPIC CHOLECYSTECTOMY SINGLE SITE WITH INTRAOPERATIVE CHOLANGIOGRAM;  Surgeon: GMichael Boston MD;  [facility];  Service: General;  Laterality: N/Carla;  . LAPAROSCOPIC OVARIAN CYSTECTOMY Left 01/07/2015   Procedure: Attempted LAPAROSCOPIC OVARIAN CYSTECTOMY, Mini laparotomy with excision of left ovarian cyst;  Surgeon: PMora Bellman MD;  [facility];  Service: Gynecology;  Laterality: Left;  Requested 01-26-15 @ 3:30p  . WISDOM TOOTH EXTRACTION      Family History  Problem Relation Age of Onset  . Hypertension Paternal Grandmother   . Diabetes Paternal Grandfather   . Other Neg Hx     Social History   Tobacco Use  . Smoking status: Never Smoker  . Smokeless tobacco: Never Used  Substance Use Topics  . Alcohol use: Not Currently    Comment: occassional   . Drug use: No    OB History    Gravida  3   Para      Term      Preterm      AB  2   Living  0     SAB  2   TAB      Ectopic      Multiple      Live Births              Review of Systems  Gastrointestinal:       Cramping    Allergies  Vicodin  [hydrocodone-acetaminophen]  Home Medications    BP 120/72 (BP Location: Right Arm)   Pulse (!) 105   Temp 98.5 F (36.9 C) (Oral)   Resp 18   Ht 5' 9"  (1.753 m)   Wt 101.5 kg   LMP 06/07/2017   SpO2 100%   BMI 33.04 kg/m   Physical Exam  Constitutional: She appears well-developed and well-nourished.  HENT:  Head: Normocephalic.  Cardiovascular: Normal rate and regular rhythm.  Pulmonary/Chest: Effort normal.  Abdominal: Normal appearance and bowel sounds are normal.  Genitourinary: Vagina normal and uterus normal. Cervix exhibits no motion tenderness, no discharge and no friability.  Genitourinary Comments: Cerclage intact  Skin: Skin is warm and dry.  Nursing note and vitals reviewed.   MAU Course  Procedures (including critical care time)  Labs Reviewed  URINALYSIS, ROUTINE W REFLEX MICROSCOPIC - Abnormal; Notable for the following components:      Result Value   Color, Urine STRAW (*)    APPearance HAZY (*)    Specific Gravity, Urine 1.003 (*)    Hgb urine dipstick SMALL (*)    Leukocytes, UA LARGE (*)  Bacteria, UA MANY (*)    All other components within normal limits  CBC - Abnormal; Notable for the following components:   RBC 3.25 (*)    Hemoglobin 10.3 (*)    HCT 29.3 (*)    All other components within normal limits   No results found.   1. Cervical cerclage suture present   2. Preterm uterine contractions   3. [redacted] weeks gestation of pregnancy   4. Preterm contractions   5. Cervical cerclage suture present in second trimester       MDM  PE and labs reviewed. Bacteria and blood in urine will send urine culture.  US showed CL 4.3.  EFW 75%.      P;  Will send Urine culture.  PT to follow up office next week.  Preterm labor signs and symptoms reviewed.

## 2017-12-09 ENCOUNTER — Inpatient Hospital Stay (HOSPITAL_COMMUNITY)
Admission: AD | Admit: 2017-12-09 | Discharge: 2017-12-09 | Discharge status: Against Medical Advice | Payer: BLUE CROSS/BLUE SHIELD | Source: Ambulatory Visit | Attending: Obstetrics & Gynecology | Admitting: Obstetrics & Gynecology

## 2017-12-09 DIAGNOSIS — Z3A29 29 weeks gestation of pregnancy: Secondary | ICD-10-CM | POA: Insufficient documentation

## 2017-12-09 DIAGNOSIS — O26893 Other specified pregnancy related conditions, third trimester: Secondary | ICD-10-CM | POA: Insufficient documentation

## 2017-12-09 DIAGNOSIS — O3432 Maternal care for cervical incompetence, second trimester: Secondary | ICD-10-CM | POA: Insufficient documentation

## 2017-12-09 DIAGNOSIS — R350 Frequency of micturition: Secondary | ICD-10-CM | POA: Insufficient documentation

## 2017-12-09 LAB — URINALYSIS, ROUTINE W REFLEX MICROSCOPIC
BILIRUBIN URINE: NEGATIVE
Glucose, UA: NEGATIVE mg/dL
HGB URINE DIPSTICK: NEGATIVE
Ketones, ur: NEGATIVE mg/dL
Leukocytes, UA: NEGATIVE
Nitrite: NEGATIVE
Protein, ur: NEGATIVE mg/dL
Specific Gravity, Urine: 1.016 (ref 1.005–1.030)
pH: 7 (ref 5.0–8.0)

## 2017-12-09 LAB — CBC
HCT: 30.6 % — ABNORMAL LOW (ref 36.0–46.0)
Hemoglobin: 10.3 g/dL — ABNORMAL LOW (ref 12.0–15.0)
MCH: 31.1 pg (ref 26.0–34.0)
MCHC: 33.7 g/dL (ref 30.0–36.0)
MCV: 92.4 fL (ref 78.0–100.0)
PLATELETS: 220 10*3/uL (ref 150–400)
RBC: 3.31 MIL/uL — ABNORMAL LOW (ref 3.87–5.11)
RDW: 14.5 % (ref 11.5–15.5)
WBC: 5.7 10*3/uL (ref 4.0–10.5)

## 2017-12-09 LAB — LACTATE DEHYDROGENASE: LDH: 101 U/L (ref 98–192)

## 2017-12-09 LAB — COMPREHENSIVE METABOLIC PANEL
ALT: 11 U/L (ref 0–44)
ANION GAP: 10 (ref 5–15)
AST: 12 U/L — AB (ref 15–41)
Albumin: 3.3 g/dL — ABNORMAL LOW (ref 3.5–5.0)
Alkaline Phosphatase: 37 U/L — ABNORMAL LOW (ref 38–126)
BUN: 7 mg/dL (ref 6–20)
CHLORIDE: 104 mmol/L (ref 98–111)
CO2: 20 mmol/L — ABNORMAL LOW (ref 22–32)
Calcium: 8.8 mg/dL — ABNORMAL LOW (ref 8.9–10.3)
Creatinine, Ser: 0.61 mg/dL (ref 0.44–1.00)
Glucose, Bld: 93 mg/dL (ref 70–99)
POTASSIUM: 4 mmol/L (ref 3.5–5.1)
Sodium: 134 mmol/L — ABNORMAL LOW (ref 135–145)
Total Bilirubin: 0.4 mg/dL (ref 0.3–1.2)
Total Protein: 6.8 g/dL (ref 6.5–8.1)

## 2017-12-09 LAB — CULTURE, OB URINE: Culture: <10000 — AB

## 2017-12-09 LAB — PROTEIN / CREATININE RATIO, URINE
Creatinine, Urine: 161 mg/dL
PROTEIN CREATININE RATIO: 0.07 mg/mg{creat} (ref 0.00–0.15)
TOTAL PROTEIN, URINE: 11 mg/dL

## 2017-12-09 LAB — URIC ACID: URIC ACID, SERUM: 4.5 mg/dL (ref 2.5–7.1)

## 2017-12-09 NOTE — MAU Provider Note (Signed)
History     CSN: [id number]  Arrival date and time: 12/09/17 1005   None     Chief Complaint  Patient presents with  . Urinary Frequency   HPI  27 year old G3P0020 at 26 weeks 3 days presents to MAU with c/o urinary frequency. She denies hematuria, no dysuria. She denies vaginal discharge, no pruritus.  She does note [place], no HA, no RUQ pain.   She denies abdominal cramping, no vaginal bleeding, no leaking of fluid.   Her prenatal care has been complicated by cervical incompetence she is s/p cerclage placement at 14 weeks.   OB History    Gravida  3   Para      Term      Preterm      AB  2   Living  0     SAB  2   TAB      Ectopic      Multiple      Live Births              Past Medical History:  Diagnosis Date  . Abnormal Pap smear   . Anemia   . Bronchitis    x 1 time  . Infertility, female   . Ovarian cyst   . PCOS (polycystic ovarian syndrome)   . UTI (lower urinary tract infection)     Past Surgical History:  Procedure Laterality Date  . CERVICAL CERCLAGE    . CERVICAL CERCLAGE N/A 09/13/2017   Procedure: CERCLAGE CERVICAL;  Surgeon: Crawford Givens, MD;  Location: [facility];  Service: Gynecology;  Laterality: N/A;  . CHOLECYSTECTOMY    . LAPAROSCOPIC CHOLECYSTECTOMY SINGLE SITE WITH INTRAOPERATIVE CHOLANGIOGRAM N/A 03/01/2017   Procedure: LAPAROSCOPIC CHOLECYSTECTOMY SINGLE SITE WITH INTRAOPERATIVE CHOLANGIOGRAM;  Surgeon: Michael Boston, MD;  [facility];  Service: General;  Laterality: N/A;  . LAPAROSCOPIC OVARIAN CYSTECTOMY Left 01/07/2015   Procedure: Attempted LAPAROSCOPIC OVARIAN CYSTECTOMY, Mini laparotomy with excision of left ovarian cyst;  Surgeon: Mora Bellman, MD;  Location: [facility];  Service: Gynecology;  Laterality: Left;  Requested 01-26-15 @ 3:30p  . WISDOM TOOTH EXTRACTION      Family History  Problem Relation Age of Onset  . Hypertension Paternal Grandmother   . Diabetes Paternal Grandfather   . Other Neg Hx      Social History   Tobacco Use  . Smoking status: Never Smoker  . Smokeless tobacco: Never Used  Substance Use Topics  . Alcohol use: Not Currently    Comment: occassional   . Drug use: No    Allergies:  Allergies  Allergen Reactions  . Vicodin [Hydrocodone-Acetaminophen] Nausea And Vomiting and Other (See Comments)    Pt states does not cause swelling    Medications Prior to Admission  Medication Sig Dispense Refill Last Dose  . hydroxyprogesterone caproate (MAKENA) 250 mg/mL OIL injection Inject 250 mg into the muscle once.   Taking  . IRON PO Take by mouth.   Taking  . Prenat w/o A Vit-FeFum-FePo-FA (CONCEPT OB) 130-92.4-1 MG CAPS Take 1 capsule by mouth daily. (Patient not taking: Reported on 11/02/2017) 30 capsule 11 Not Taking  . progesterone 200 MG SUPP Place 200 mg vaginally at bedtime.   Taking    Review of Systems Physical Exam   Blood pressure 139/72, pulse 99, temperature 98 F (36.7 C), temperature source Oral, resp. rate 17, weight 99.8 kg, last menstrual period 06/07/2017, SpO2 99 %, unknown if currently breastfeeding.  Physical Exam Gen: AAOx 3 NAD  ABD: Gravid, NT, ND GU: Cervical exam deferred MAU Course  Procedures  MDM UA - negative previously drawn Urine cx from 8/16 negative PIH labs to be drawn  Assessment and Plan  27 yo at 29 weeks 3 days with no UTI, just frequency, reassured her that her uterus is pressing on her bladder and this is normal If [place] labs are within normal limit, will D/C Patient home with close f/u in office (Tuesday) Pre E precautions explained to patient  Caffie Damme 12/09/2017, 11:37 AM

## 2017-12-09 NOTE — MAU Note (Signed)
Patient needed to leave. AMA formed obtained. Dr Alwyn Pea in [place] and made aware

## 2017-12-09 NOTE — MAU Note (Signed)
Carla Henderson is a 27 y.o. at 64w3dhere in MAU reporting:  +urinary frequency Denies burning or pain with urination Onset of complaint: started yesterday evening Pain score: denies Vitals:   12/09/17 1042  BP: 139/72  Pulse: 99  Resp: 17  Temp: 98 F (36.7 C)  SpO2: 99%     FHT:155 Lab orders placed from triage: ua

## 2018-02-08 ENCOUNTER — Inpatient Hospital Stay (HOSPITAL_COMMUNITY)
Admission: AD | Admit: 2018-02-08 | Discharge: 2018-02-08 | Disposition: A | Discharge status: Home / Self Care | Payer: BLUE CROSS/BLUE SHIELD | Source: Ambulatory Visit | Attending: Obstetrics and Gynecology | Admitting: Obstetrics and Gynecology

## 2018-02-08 ENCOUNTER — Encounter (HOSPITAL_COMMUNITY): Payer: Self-pay | Admitting: *Deleted

## 2018-02-08 ENCOUNTER — Other Ambulatory Visit: Payer: Self-pay

## 2018-02-08 DIAGNOSIS — R109 Unspecified abdominal pain: Secondary | ICD-10-CM | POA: Insufficient documentation

## 2018-02-08 DIAGNOSIS — Z3A35 35 weeks gestation of pregnancy: Secondary | ICD-10-CM | POA: Diagnosis not present

## 2018-02-08 DIAGNOSIS — O26893 Other specified pregnancy related conditions, third trimester: Secondary | ICD-10-CM | POA: Diagnosis not present

## 2018-02-08 DIAGNOSIS — O4703 False labor before 37 completed weeks of gestation, third trimester: Secondary | ICD-10-CM

## 2018-02-08 DIAGNOSIS — K5901 Slow transit constipation: Secondary | ICD-10-CM

## 2018-02-08 DIAGNOSIS — O479 False labor, unspecified: Secondary | ICD-10-CM

## 2018-02-08 LAB — URINALYSIS, ROUTINE W REFLEX MICROSCOPIC
BILIRUBIN URINE: NEGATIVE
Glucose, UA: NEGATIVE mg/dL
Hgb urine dipstick: NEGATIVE
KETONES UR: NEGATIVE mg/dL
Nitrite: NEGATIVE
PROTEIN: NEGATIVE mg/dL
Specific Gravity, Urine: 1.013 (ref 1.005–1.030)
pH: 6 (ref 5.0–8.0)

## 2018-02-08 LAB — WET PREP, GENITAL
Clue Cells Wet Prep HPF POC: NONE SEEN
Sperm: NONE SEEN
TRICH WET PREP: NONE SEEN
YEAST WET PREP: NONE SEEN

## 2018-02-08 NOTE — MAU Provider Note (Addendum)
Chief Complaint:  Abdominal Pain   First Provider Initiated Contact with Patient 02/08/18 [phone]     HPI: Carla Henderson is a 27 y.o. G3P0020 at 25w1dwho presents to maternity admissions reporting mid RT abdominal pain/tightening that is intermittent and sharp when she woke up this morning. Pain simmered down to a dull sensation and radiated over towards the left side of her abdomen. She thought it was gas pain so she sat on the toilet for a while, but "nothing happened." She became concerned once the pain intensified and caused her to "double over" in pain. Last BM was yesterday and normal per pt. Pt endorses drinking 2L of water a day and feeling hydrated.  She has a h/o of second trimester loss at 19 wks of one twin and loss of twin at 21 wks. She had a cerclage placed by Dr. DCharlesetta Garibaldiat 16 wks; scheduled to be removed 02/15/18. She also receives weekly Makena injections at [facility].  Denies contractions, leakage of fluid or vaginal bleeding. Good fetal movement.   Pregnancy Course:   Past Medical History:  Diagnosis Date  . Abnormal Pap smear   . Anemia   . Bronchitis    x 1 time  . Infertility, female   . Ovarian cyst   . PCOS (polycystic ovarian syndrome)   . UTI (lower urinary tract infection)    OB History  Gravida Para Term Preterm AB Living  3 0     2 0  SAB TAB Ectopic Multiple Live Births  2            # Outcome Date GA Lbr Len/2nd Weight Sex Delivery Anes PTL Lv  3 Current           2 SAB 2017             Birth Comments: twin gestation, a- SAB @ 19 weeks, B-SAB @21  weeks  1 SAB 2014 857w0d          [facility]   Past Surgical History:  Procedure Laterality Date  . CERVICAL CERCLAGE    . CERVICAL CERCLAGE N/A 09/13/2017   Procedure: CERCLAGE CERVICAL;  Surgeon: DiCrawford GivensMD;  [facility];  Service: Gynecology;  Laterality: N/A;  . CHOLECYSTECTOMY    . LAPAROSCOPIC CHOLECYSTECTOMY SINGLE SITE WITH INTRAOPERATIVE CHOLANGIOGRAM  N/A 03/01/2017   Procedure: LAPAROSCOPIC CHOLECYSTECTOMY SINGLE SITE WITH INTRAOPERATIVE CHOLANGIOGRAM;  Surgeon: GrMichael BostonMD;  [facility];  Service: General;  Laterality: N/A;  . LAPAROSCOPIC OVARIAN CYSTECTOMY Left 01/07/2015   Procedure: Attempted LAPAROSCOPIC OVARIAN CYSTECTOMY, Mini laparotomy with excision of left ovarian cyst;  Surgeon: PeMora BellmanMD;  [facility];  Service: Gynecology;  Laterality: Left;  Requested 01-26-15 @ 3:30p  . WISDOM TOOTH EXTRACTION     Family History  Problem Relation Age of Onset  . Healthy Mother   . Healthy Father   . Hypertension Paternal Grandmother   . Diabetes Paternal Grandfather   . Other Neg Hx    Social History   Tobacco Use  . Smoking status: Never Smoker  . Smokeless tobacco: Never Used  Substance Use Topics  . Alcohol use: Not Currently    Comment: occassional   . Drug use: No   Allergies  Allergen Reactions  . Vicodin [Hydrocodone-Acetaminophen] Nausea And Vomiting and Other (See Comments)    Pt states does not cause swelling   Medications Prior to Admission  Medication Sig Dispense Refill Last Dose  .  hydroxyprogesterone caproate (MAKENA) 250 mg/mL OIL injection Inject 250 mg into the muscle once.   Taking  . IRON PO Take by mouth.   Taking  . Prenat w/o A Vit-FeFum-FePo-FA (CONCEPT OB) 130-92.4-1 MG CAPS Take 1 capsule by mouth daily. (Patient not taking: Reported on 11/02/2017) 30 capsule 11 Not Taking  . progesterone 200 MG SUPP Place 200 mg vaginally at bedtime.   Taking    I have reviewed patient's Past Medical Hx, Surgical Hx, Family Hx, Social Hx, medications and allergies.   ROS:  Review of Systems  Physical Exam   Patient Vitals for the past 24 hrs:  BP Temp Temp src Pulse Resp SpO2 Height Weight  02/08/18 1130 124/72 98.6 F (37 C) Oral 92 18 100 % - -  02/08/18 0857 98/78 97.8 F (36.6 C) Oral 95 20 100 % - -  02/08/18 0846 - - - - - - 5' 9"  (1.753 m) 108.3 kg   Physical Exam performed  by MAU provider Laury Deep, MSN, CNM 02/08/2018, 9:43 AM Nursing note and vitals reviewed. Constitutional: She is oriented to person, place, and time. She appears well-developed and well-nourished.  HENT:  Head: Normocephalic and atraumatic.  Eyes: Pupils are equal, round, and reactive to light.  Neck: Normal range of motion.  Cardiovascular: Normal rate.  Respiratory: Effort normal.  GI: Soft.  Genitourinary:  Genitourinary Comments: Uterus: gravid, S=D, SE: cervix is smooth, pink, no lesions, moderate amt of thick, white vaginal d/c -- WP, GC/CT done, closed/long/firm -- cerclage palpable with no increased tension and knots intact, no CMT or friability, no adnexal tenderness   Musculoskeletal: Normal range of motion.  Neurological: She is alert and oriented to person, place, and time.  Skin: Skin is warm and dry.  Psychiatric: She has a normal mood and affect. Her behavior is normal. Judgment and thought content normal.   NST: FHR baseline 145 bpm, Variability: moderate, Accelerations:present, Decelerations:  Absent= Cat 1/Reactive UC:   irregular, every 7-15 minutes lasting 60 seconds, not felt by pt SVE:   Dilation: Closed Effacement (%): Thick Exam by:: Sunday Corn, CNM  Labs: Results for orders placed or performed during the hospital encounter of 02/08/18 (from the past 24 hour(s))  Urinalysis, Routine w reflex microscopic     Status: Abnormal   Collection Time: 02/08/18  8:48 AM  Result Value Ref Range   Color, Urine YELLOW YELLOW   APPearance CLEAR CLEAR   Specific Gravity, Urine 1.013 1.005 - 1.030   pH 6.0 5.0 - 8.0   Glucose, UA NEGATIVE NEGATIVE mg/dL   Hgb urine dipstick NEGATIVE NEGATIVE   Bilirubin Urine NEGATIVE NEGATIVE   Ketones, ur NEGATIVE NEGATIVE mg/dL   Protein, ur NEGATIVE NEGATIVE mg/dL   Nitrite NEGATIVE NEGATIVE   Leukocytes, UA MODERATE (A) NEGATIVE   RBC / HPF 0-5 0 - 5 RBC/hpf   WBC, UA 6-10 0 - 5 WBC/hpf   Bacteria, UA MANY (A) NONE SEEN    Squamous Epithelial / LPF 0-5 0 - 5   Mucus PRESENT   Wet prep, genital     Status: Abnormal   Collection Time: 02/08/18 10:01 AM  Result Value Ref Range   Yeast Wet Prep HPF POC NONE SEEN NONE SEEN   Trich, Wet Prep NONE SEEN NONE SEEN   Clue Cells Wet Prep HPF POC NONE SEEN NONE SEEN   WBC, Wet Prep HPF POC FEW (A) NONE SEEN   Sperm NONE SEEN     Imaging:  No results  found.  MAU Course: Orders Placed This Encounter  Procedures  . Wet prep, genital  . Urinalysis, Routine w reflex microscopic  . Diet - low sodium heart healthy  . Increase activity slowly  . Call MD for:  . Call MD for:  temperature >100.4  . Call MD for:  persistant nausea and vomiting  . Call MD for:  severe uncontrolled pain  . Call MD for:  redness, tenderness, or signs of infection (pain, swelling, redness, odor or green/yellow discharge around incision site)  . Call MD for:  difficulty breathing, headache or visual disturbances  . Call MD for:  hives  . Call MD for:  persistant dizziness or light-headedness  . Call MD for:  extreme fatigue  . (HEART FAILURE PATIENTS) Call MD:  Anytime you have any of the following symptoms: 1) 3 pound weight gain in 24 hours or 5 pounds in 1 week 2) shortness of breath, with or without a dry hacking cough 3) swelling in the hands, feet or stomach 4) if you have to sleep on extra pillows at night in order to breathe.  . Discharge patient Discharge disposition: 01-Home or Self Care; Discharge patient date: 02/08/2018   No orders of the defined types were placed in this encounter.   MDM: Chart and labs reviewed, PE, nst reactive, wet pre neg, pt discharged.   Assessment: Reactive NST, uterine irritability. Wet prep unremarkable. UA unremarkable except many bacteria which pt has in past and moderate leukocytes, pt denies s/sx of UTI.  1. Braxton Hick's contraction   2. Slow transit constipation   3. [redacted] weeks gestation of pregnancy    Plan: Discharge home in stable  condition.  Labor precautions and fetal kick counts GC/C: Pending  UC: Pending. Constipnation: pt may use stool softer OTC and mirilax as stated on bottle with prune juice. Warm compresses.  Follow-up [facility] Follow up.   Specialty:  Obstetrics and Gynecology Why:  1 weeks for cerclage removal and ROB Contact information: [address] [place] [address] [phone]          Allergies as of 02/08/2018      Reactions   Vicodin [hydrocodone-acetaminophen] Nausea And Vomiting, Other (See Comments)   Pt states does not cause swelling      Medication List    TAKE these medications   CONCEPT OB 130-92.4-1 MG Caps Take 1 capsule by mouth daily.   hydroxyprogesterone caproate 250 mg/mL Oil injection Commonly known as:  MAKENA Inject 250 mg into the muscle once.   IRON PO Take by mouth.   progesterone 200 MG Supp Place 200 mg vaginally at bedtime.      Consulted Dr Charlesetta Garibaldi , she is aware of pt POC and agrees with discharge.    [facility] NP-C, CNM [place], [place] 02/08/2018 11:39 AM

## 2018-02-08 NOTE — MAU Provider Note (Signed)
History     CSN: [id number]  Arrival date and time: 02/08/18 6213   First Provider Initiated Contact with Patient 02/08/18 [phone]      Chief Complaint  Patient presents with  . Abdominal Pain   HPI  Ms.  Carla Henderson is a 27 y.o. year old G75P0020 female at 31w1dweeks gestation who presents to MAU reporting mid RT abdominal pain/tightening that is intermittent and sharp when she woke up this morning. She thought it was gas pain so she sat on the toilet for a while, but "nothing happened." She became concerned once the pain intensified and caused her to "double over" in pain. She has a h/o of second trimester loss at 19 wks of one twin and loss of twin at 21 wks. She had a cerclage placed by Dr. DCharlesetta Garibaldiat 16 wks; scheduled to be removed 02/15/18. She also receives weekly [facility] injections at [facility]  Past Medical History:  Diagnosis Date  . Abnormal Pap smear   . Anemia   . Bronchitis    x 1 time  . Infertility, female   . Ovarian cyst   . PCOS (polycystic ovarian syndrome)   . UTI (lower urinary tract infection)     Past Surgical History:  Procedure Laterality Date  . CERVICAL CERCLAGE    . CERVICAL CERCLAGE N/A 09/13/2017   Procedure: CERCLAGE CERVICAL;  Surgeon: DCrawford Givens MD;  [facility];  Service: Gynecology;  Laterality: N/A;  . CHOLECYSTECTOMY    . LAPAROSCOPIC CHOLECYSTECTOMY SINGLE SITE WITH INTRAOPERATIVE CHOLANGIOGRAM N/A 03/01/2017   Procedure: LAPAROSCOPIC CHOLECYSTECTOMY SINGLE SITE WITH INTRAOPERATIVE CHOLANGIOGRAM;  Surgeon: GMichael Boston MD;  [facility];  Service: General;  Laterality: N/A;  . LAPAROSCOPIC OVARIAN CYSTECTOMY Left 01/07/2015   Procedure: Attempted LAPAROSCOPIC OVARIAN CYSTECTOMY, Mini laparotomy with excision of left ovarian cyst;  Surgeon: PMora Bellman MD;  [facility];  Service: Gynecology;  Laterality: Left;  Requested 01-26-15 @ 3:30p  . WISDOM TOOTH EXTRACTION      Family History  Problem Relation Age of Onset  .  Healthy Mother   . Healthy Father   . Hypertension Paternal Grandmother   . Diabetes Paternal Grandfather   . Other Neg Hx     Social History   Tobacco Use  . Smoking status: Never Smoker  . Smokeless tobacco: Never Used  Substance Use Topics  . Alcohol use: Not Currently    Comment: occassional   . Drug use: No    Allergies:  Allergies  Allergen Reactions  . Vicodin [Hydrocodone-Acetaminophen] Nausea And Vomiting and Other (See Comments)    Pt states does not cause swelling    Medications Prior to Admission  Medication Sig Dispense Refill Last Dose  . hydroxyprogesterone caproate (MAKENA) 250 mg/mL OIL injection Inject 250 mg into the muscle once.   Taking  . IRON PO Take by mouth.   Taking  . Prenat w/o A Vit-FeFum-FePo-FA (CONCEPT OB) 130-92.4-1 MG CAPS Take 1 capsule by mouth daily. (Patient not taking: Reported on 11/02/2017) 30 capsule 11 Not Taking  . progesterone 200 MG SUPP Place 200 mg vaginally at bedtime.   Taking    Review of Systems  Constitutional: Negative.   HENT: Negative.   Eyes: Negative.   Respiratory: Negative.   Cardiovascular: Negative.   Gastrointestinal: Positive for abdominal pain (RT mid abdominal pain).  Endocrine: Negative.   Genitourinary: Negative.   Musculoskeletal: Negative.   Skin: Negative.   Allergic/Immunologic: Negative.   Neurological: Negative.   Hematological: Negative.  Psychiatric/Behavioral: Negative.    Physical Exam   Blood pressure 98/78, pulse 95, temperature 97.8 F (36.6 C), temperature source Oral, resp. rate 20, height 5' 9"  (1.753 m), weight 108.3 kg, last menstrual period 06/07/2017, SpO2 100 %.  Physical Exam  Nursing note and vitals reviewed. Constitutional: She is oriented to person, place, and time. She appears well-developed and well-nourished.  HENT:  Head: Normocephalic and atraumatic.  Eyes: Pupils are equal, round, and reactive to light.  Neck: Normal range of motion.  Cardiovascular: Normal  rate.  Respiratory: Effort normal.  GI: Soft.  Genitourinary:  Genitourinary Comments: Uterus: gravid, S=D, SE: cervix is smooth, pink, no lesions, moderate amt of thick, white vaginal d/c -- WP, GC/CT done, closed/long/firm -- cerclage palpable with no increased tension and knots intact, no CMT or friability, no adnexal tenderness   Musculoskeletal: Normal range of motion.  Neurological: She is alert and oriented to person, place, and time.  Skin: Skin is warm and dry.  Psychiatric: She has a normal mood and affect. Her behavior is normal. Judgment and thought content normal.    MAU Course  Procedures  MDM Wet Prep GC/CT -- pending NST - FHR: 140 bpm / moderate variability / accels present / decels absent / TOCO: irregular UC's  *1040: Carla Henderson, CNM notified by RN that patient is requesting to be seen by someone from "her provider's group, because she knows they "know her situation".  Noralyn Pick, CNM notified @ [phone] that patient is requesting to be seen by a [facility] provider -- report given to and care assumed by [facility] at this time -- Luvenia Starch will come see pt  [facility], CNM on unit @ [address] MSN, CNM 02/08/2018, 9:43 AM  Results for orders placed or performed during the hospital encounter of 02/08/18 (from the past 24 hour(s))  Urinalysis, Routine w reflex microscopic     Status: Abnormal   Collection Time: 02/08/18  8:48 AM  Result Value Ref Range   Color, Urine YELLOW YELLOW   APPearance CLEAR CLEAR   Specific Gravity, Urine 1.013 1.005 - 1.030   pH 6.0 5.0 - 8.0   Glucose, UA NEGATIVE NEGATIVE mg/dL   Hgb urine dipstick NEGATIVE NEGATIVE   Bilirubin Urine NEGATIVE NEGATIVE   Ketones, ur NEGATIVE NEGATIVE mg/dL   Protein, ur NEGATIVE NEGATIVE mg/dL   Nitrite NEGATIVE NEGATIVE   Leukocytes, UA MODERATE (A) NEGATIVE   RBC / HPF 0-5 0 - 5 RBC/hpf   WBC, UA 6-10 0 - 5 WBC/hpf   Bacteria, UA MANY (A) NONE SEEN   Squamous Epithelial / LPF 0-5 0 - 5   Mucus  PRESENT   Wet prep, genital     Status: Abnormal   Collection Time: 02/08/18 10:01 AM  Result Value Ref Range   Yeast Wet Prep HPF POC NONE SEEN NONE SEEN   Trich, Wet Prep NONE SEEN NONE SEEN   Clue Cells Wet Prep HPF POC NONE SEEN NONE SEEN   WBC, Wet Prep HPF POC FEW (A) NONE SEEN   Sperm NONE SEEN     Assessment and Plan

## 2018-02-08 NOTE — MAU Note (Signed)
Pt presents with c/o right sided intermittent abdominal pain that began this morning. Hasn't taken any meds for pain management.   Denies VB or LOF.  Reports +FM.

## 2018-02-08 NOTE — Discharge Instructions (Signed)
Braxton Hicks Contractions Contractions of the uterus can occur throughout pregnancy, but they are not always a sign that you are in labor. You may have practice contractions called Braxton Hicks contractions. These false labor contractions are sometimes confused with true labor. What are [place] contractions? Braxton Hicks contractions are tightening movements that occur in the muscles of the uterus before labor. Unlike true labor contractions, these contractions do not result in opening (dilation) and thinning of the cervix. Toward the end of pregnancy (32-34 weeks), Braxton Hicks contractions can happen more often and may become stronger. These contractions are sometimes difficult to tell apart from true labor because they can be very uncomfortable. You should not feel embarrassed if you go to the hospital with false labor. Sometimes, the only way to tell if you are in true labor is for your health care provider to look for changes in the cervix. The health care provider will do a physical exam and may monitor your contractions. If you are not in true labor, the exam should show that your cervix is not dilating and your water has not broken. If there are other health problems associated with your pregnancy, it is completely safe for you to be sent home with false labor. You may continue to have Braxton Hicks contractions until you go into true labor. How to tell the difference between true labor and false labor True labor  Contractions last 30-70 seconds.  Contractions become very regular.  Discomfort is usually felt in the top of the uterus, and it spreads to the lower abdomen and low back.  Contractions do not go away with walking.  Contractions usually become more intense and increase in frequency.  The cervix dilates and gets thinner. False labor  Contractions are usually shorter and not as strong as true labor contractions.  Contractions are usually irregular.  Contractions  are often felt in the front of the lower abdomen and in the groin.  Contractions may go away when you walk around or change positions while lying down.  Contractions get weaker and are shorter-lasting as time goes on.  The cervix usually does not dilate or become thin. Follow these instructions at home:  Take over-the-counter and prescription medicines only as told by your health care provider.  Keep up with your usual exercises and follow other instructions from your health care provider.  Eat and drink lightly if you think you are going into labor.  If Braxton Hicks contractions are making you uncomfortable: ? Change your position from lying down or resting to walking, or change from walking to resting. ? Sit and rest in a tub of warm water. ? Drink enough fluid to keep your urine pale yellow. Dehydration may cause these contractions. ? Do slow and deep breathing several times an hour.  Keep all follow-up prenatal visits as told by your health care provider. This is important. Contact a health care provider if:  You have a fever.  You have continuous pain in your abdomen. Get help right away if:  Your contractions become stronger, more regular, and closer together.  You have fluid leaking or gushing from your vagina.  You pass blood-tinged mucus (bloody show).  You have bleeding from your vagina.  You have low back pain that you never had before.  You feel your babys head pushing down and causing pelvic pressure.  Your baby is not moving inside you as much as it used to. Summary  Contractions that occur before labor are called Braxton  Hicks contractions, false labor, or practice contractions.  Braxton Hicks contractions are usually shorter, weaker, farther apart, and less regular than true labor contractions. True labor contractions usually become progressively stronger and regular and they become more frequent.  Manage discomfort from [facility] contractions by  changing position, resting in a warm bath, drinking plenty of water, or practicing deep breathing. This information is not intended to replace advice given to you by your health care provider. Make sure you discuss any questions you have with your health care provider. Document Released: 08/24/2016 Document Revised: 08/24/2016 Document Reviewed: 08/24/2016 Elsevier Interactive Patient Education  2018 [employer].   Constipation, Adult Constipation is when a person has fewer bowel movements in a week than normal, has difficulty having a bowel movement, or has stools that are dry, hard, or larger than normal. Constipation may be caused by an underlying condition. It may become worse with age if a person takes certain medicines and does not take in enough fluids. Follow these instructions at home: Eating and drinking   Eat foods that have a lot of fiber, such as fresh fruits and vegetables, whole grains, and beans.  Limit foods that are high in fat, low in fiber, or overly processed, such as french fries, hamburgers, cookies, candies, and soda.  Drink enough fluid to keep your urine clear or pale yellow. General instructions  Exercise regularly or as told by your health care provider.  Go to the restroom when you have the urge to go. Do not hold it in.  Take over-the-counter and prescription medicines only as told by your health care provider. These include any fiber supplements.  Practice pelvic floor retraining exercises, such as deep breathing while relaxing the lower abdomen and pelvic floor relaxation during bowel movements.  Watch your condition for any changes.  Keep all follow-up visits as told by your health care provider. This is important. Contact a health care provider if:  You have pain that gets worse.  You have a fever.  You do not have a bowel movement after 4 days.  You vomit.  You are not hungry.  You lose weight.  You are bleeding from the anus.  You  have thin, pencil-like stools. Get help right away if:  You have a fever and your symptoms suddenly get worse.  You leak stool or have blood in your stool.  Your abdomen is bloated.  You have severe pain in your abdomen.  You feel dizzy or you faint. This information is not intended to replace advice given to you by your health care provider. Make sure you discuss any questions you have with your health care provider. Document Released: 01/07/2004 Document Revised: 10/29/2015 Document Reviewed: 09/29/2015 Elsevier Interactive Patient Education  2018 [place].   Abdominal Pain During Pregnancy Belly (abdominal) pain is common during pregnancy. Most of the time, it is not a serious problem. Other times, it can be a sign that something is wrong with the pregnancy. Always tell your doctor if you have belly pain. Follow these instructions at home: Monitor your belly pain for any changes. The following actions may help you feel better:  Do not have sex (intercourse) or put anything in your vagina until you feel better.  Rest until your pain stops.  Drink clear fluids if you feel sick to your stomach (nauseous). Do not eat solid food until you feel better.  Only take medicine as told by your doctor.  Keep all doctor visits as told.  Get  help right away if:  You are bleeding, leaking fluid, or pieces of tissue come out of your vagina.  You have more pain or cramping.  You keep throwing up (vomiting).  You have pain when you pee (urinate) or have blood in your pee.  You have a fever.  You do not feel your baby moving as much.  You feel very weak or feel like passing out.  You have trouble breathing, with or without belly pain.  You have a very bad headache and belly pain.  You have fluid leaking from your vagina and belly pain.  You keep having watery poop (diarrhea).  Your belly pain does not go away after resting, or the pain gets worse. This information is not  intended to replace advice given to you by your health care provider. Make sure you discuss any questions you have with your health care provider. Document Released: 03/29/2009 Document Revised: 11/17/2015 Document Reviewed: 11/07/2012 Elsevier Interactive Patient Education  [employer].

## 2018-02-11 LAB — GC/CHLAMYDIA PROBE AMP (~~LOC~~) NOT AT ARMC
CHLAMYDIA, DNA PROBE: NEGATIVE
Neisseria Gonorrhea: NEGATIVE

## 2018-02-13 LAB — OB RESULTS CONSOLE GBS: STREP GROUP B AG: NEGATIVE

## 2018-02-20 IMAGING — US US OB TRANSVAGINAL
1 series · 15 of 28 positions shown · non-contrast
Comparison: 07/09/2015

CLINICAL DATA: Vaginal bleeding.  Pregnancy.

EXAM:
TWIN OBSTETRICAL ULTRASOUND <14 WKS

[Series 1: us ob transvaginal · 57 acquisitions, 15 frames shown]
[im 1/57]
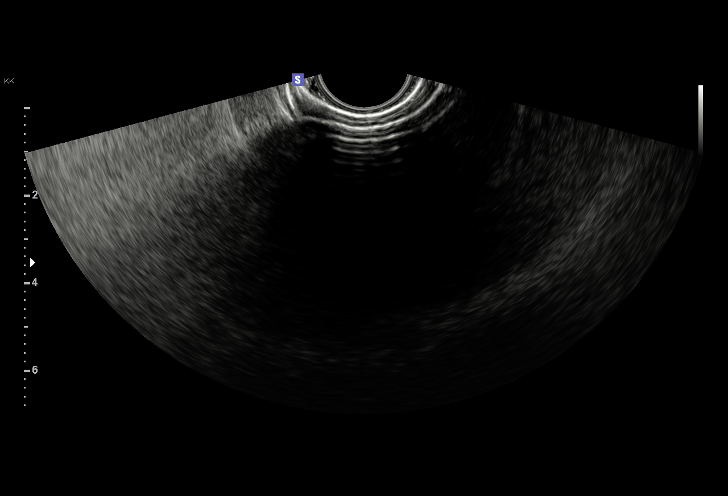
[im 5/57]
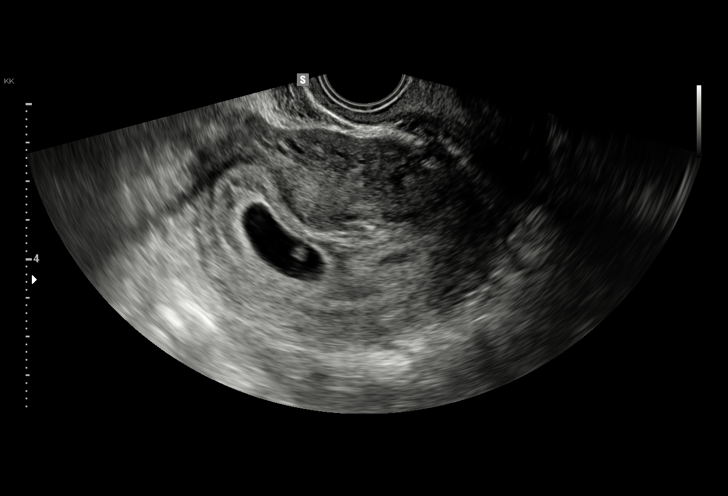
[im 9/57]
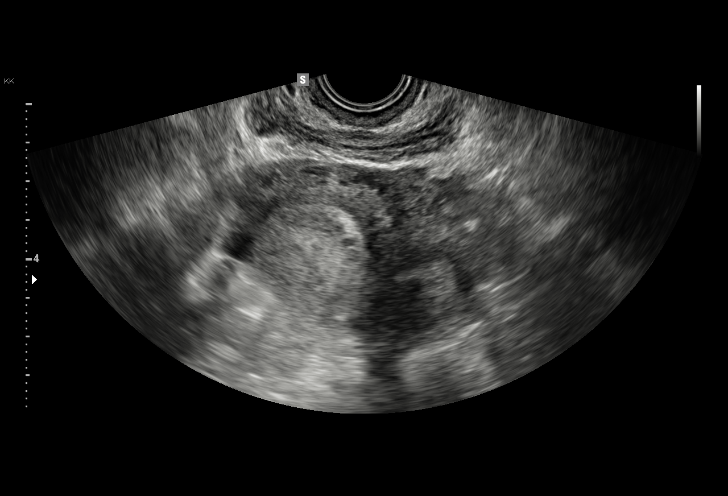
[im 13/57]
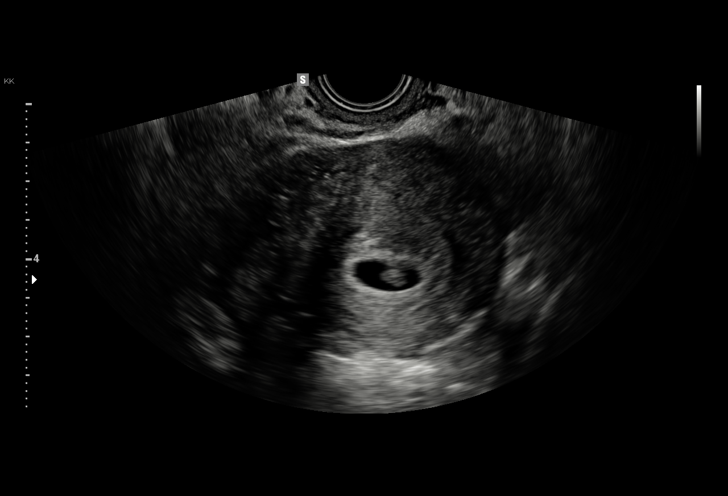
[im 17/57]
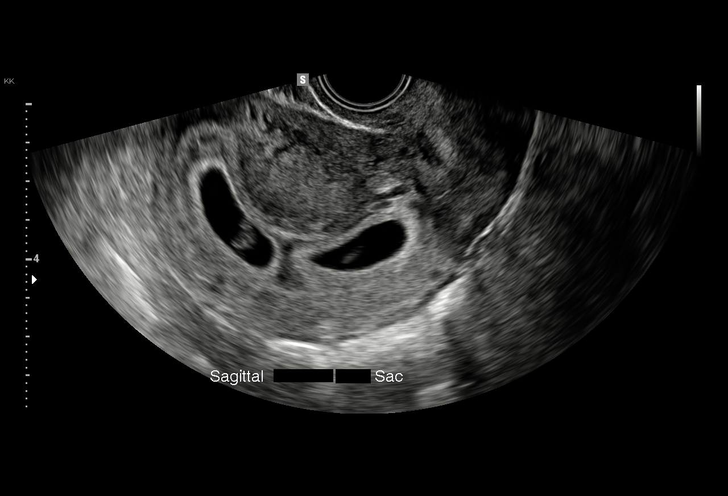
[im 21/57]
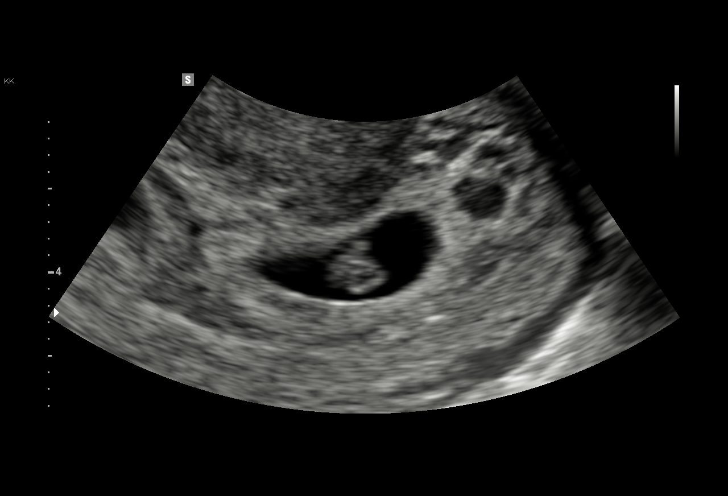
[im 25/57]
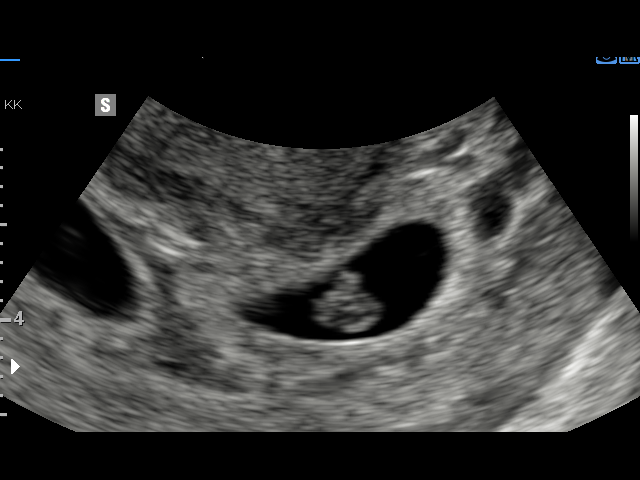
[im 30/57]
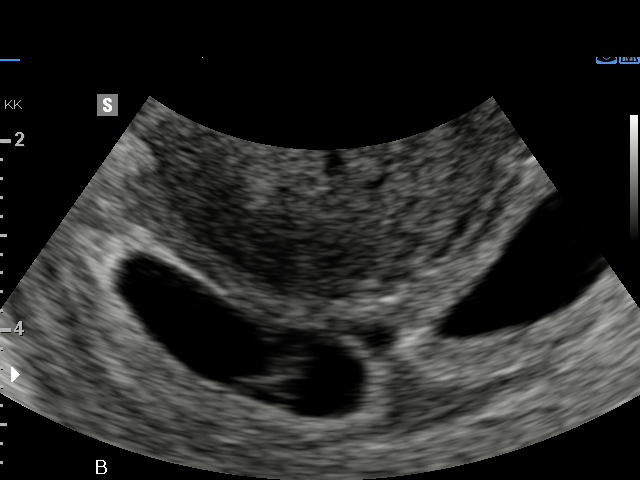
[im 32/57]
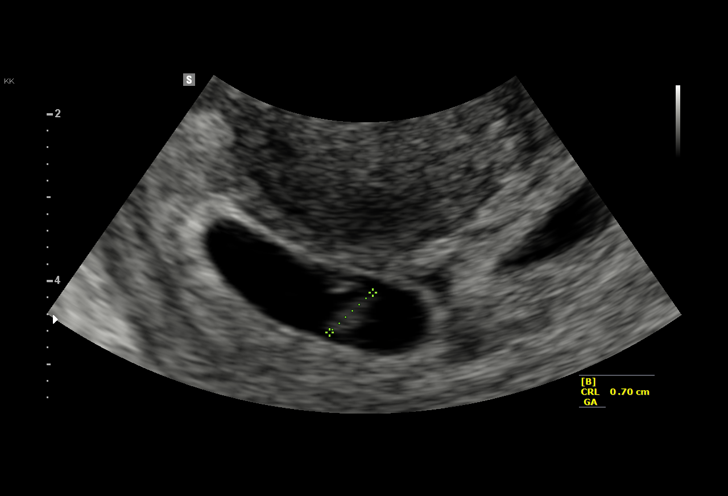
[im 36/57]
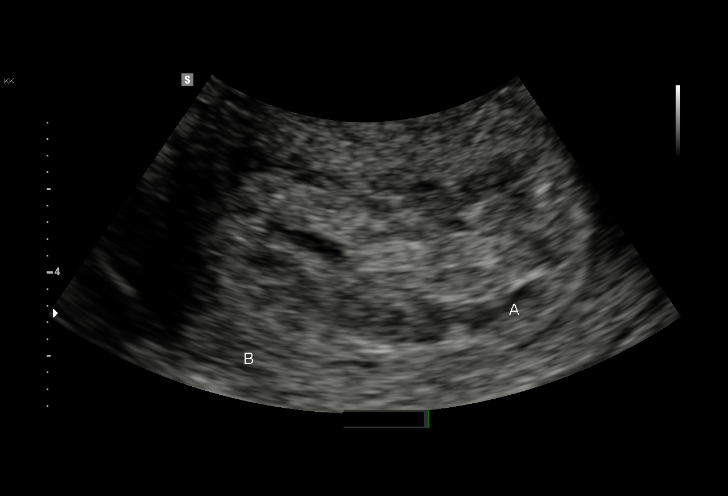
[im 40/57]
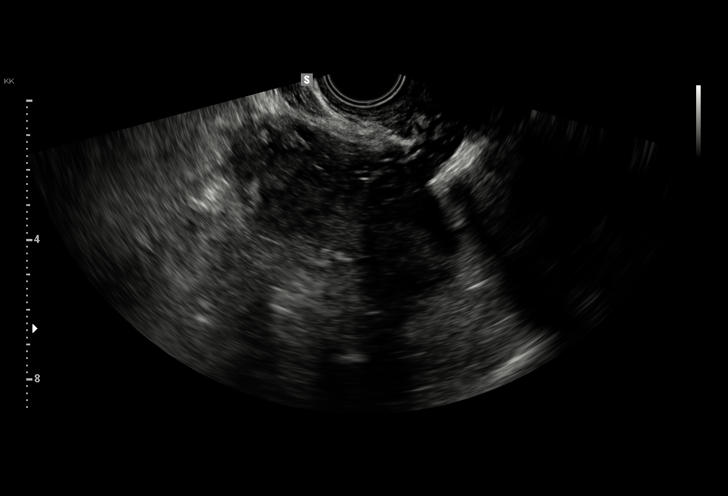
[im 44/57]
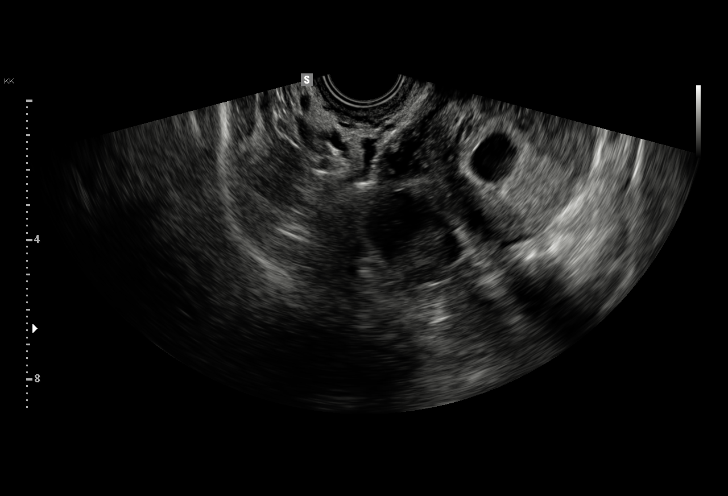
[im 48/57]
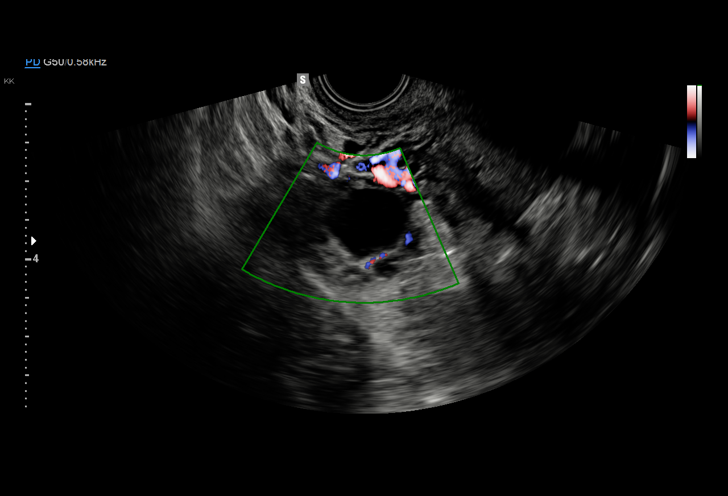
[im 52/57]
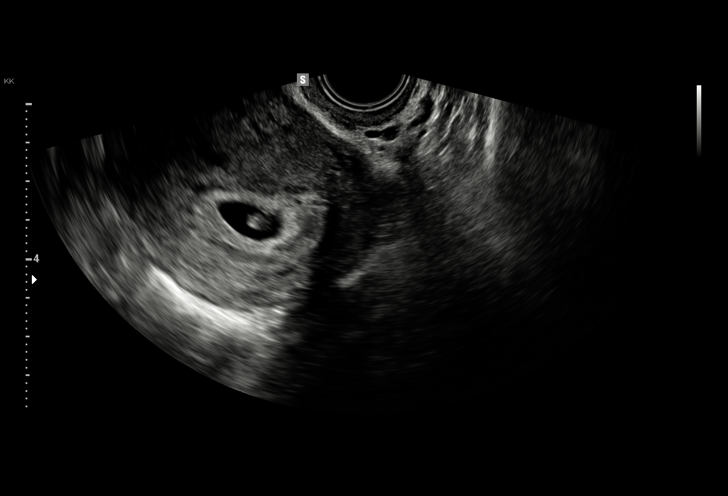
[im 57/57]
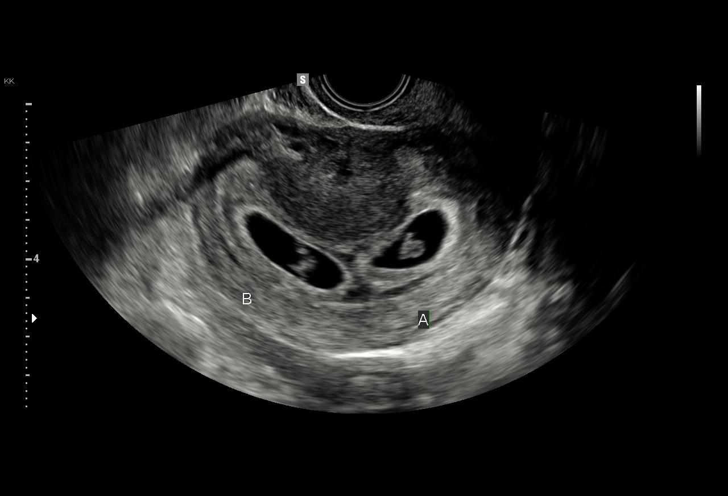

[15 of 28 positions shown; findings below may reference images not displayed]

FINDINGS: TWIN 1

Intrauterine gestational sac: Visualized/normal in shape.

Yolk sac:  Present

Embryo:  Present

Cardiac Activity: Present

Heart Rate: 129 bpm

CRL:   6.9  mm   6 w 5d                  US EDC: 03/04/2016

TWIN 2

Intrauterine gestational sac: Visualized/normal in shape.

Yolk sac:  Present

Embryo:  Present

Cardiac Activity: Present

Heart Rate: 138 bpm

CRL:   7.2  mm   6 w 5d                  US EDC: 03/04/2016

Maternal uterus/adnexae: Small subchorionic hemorrhage located
between the 2 gestations. Corpus luteum cyst of the right ovary
ovaries are otherwise normal. No adnexal mass. No pelvic free fluid.
IMPRESSION: 1. Small subchorionic hemorrhage located between the 2 gestations.
2. Twin live dichorionic diamniotic intrauterine pregnancies as
described above.

## 2018-02-24 ENCOUNTER — Encounter (HOSPITAL_COMMUNITY): Payer: Self-pay

## 2018-02-24 ENCOUNTER — Inpatient Hospital Stay (HOSPITAL_COMMUNITY)
Admission: AD | Admit: 2018-02-24 | Discharge: 2018-02-24 | Disposition: A | Discharge status: Home / Self Care | Payer: BLUE CROSS/BLUE SHIELD | Source: Ambulatory Visit | Attending: Obstetrics and Gynecology | Admitting: Obstetrics and Gynecology

## 2018-02-24 DIAGNOSIS — O2686 Pruritic urticarial papules and plaques of pregnancy (PUPPP): Secondary | ICD-10-CM | POA: Diagnosis not present

## 2018-02-24 DIAGNOSIS — L299 Pruritus, unspecified: Secondary | ICD-10-CM | POA: Diagnosis present

## 2018-02-24 DIAGNOSIS — Z3A37 37 weeks gestation of pregnancy: Secondary | ICD-10-CM | POA: Diagnosis not present

## 2018-02-24 LAB — COMPREHENSIVE METABOLIC PANEL
ALBUMIN: 3.2 g/dL — AB (ref 3.5–5.0)
ALT: 17 U/L (ref 0–44)
AST: 17 U/L (ref 15–41)
Alkaline Phosphatase: 85 U/L (ref 38–126)
Anion gap: 8 (ref 5–15)
BUN: 8 mg/dL (ref 6–20)
CO2: 21 mmol/L — ABNORMAL LOW (ref 22–32)
CREATININE: 0.54 mg/dL (ref 0.44–1.00)
Calcium: 9.1 mg/dL (ref 8.9–10.3)
Chloride: 107 mmol/L (ref 98–111)
GFR calc Af Amer: >60 mL/min (ref >60)
GLUCOSE: 81 mg/dL (ref 70–99)
Potassium: 3.9 mmol/L (ref 3.5–5.1)
Sodium: 136 mmol/L (ref 135–145)
Total Bilirubin: 0.4 mg/dL (ref 0.3–1.2)
Total Protein: 6.5 g/dL (ref 6.5–8.1)

## 2018-02-24 LAB — URINALYSIS, ROUTINE W REFLEX MICROSCOPIC
Bilirubin Urine: NEGATIVE
GLUCOSE, UA: NEGATIVE mg/dL
Hgb urine dipstick: NEGATIVE
Ketones, ur: NEGATIVE mg/dL
Nitrite: NEGATIVE
PH: 7 (ref 5.0–8.0)
PROTEIN: NEGATIVE mg/dL
Specific Gravity, Urine: 1.013 (ref 1.005–1.030)

## 2018-02-24 NOTE — Discharge Instructions (Signed)
You can take Benadryl 25 mg every 6 hours as needed for itching. Please tell your OB office you have bile acids results that are pending.

## 2018-02-24 NOTE — MAU Provider Note (Signed)
History     CSN: [id number]  Arrival date and time: 02/24/18 8657   First Provider Initiated Contact with Patient 02/24/18 1012      Chief Complaint  Patient presents with  . Pruritis   HPI  Ms.  Carla Henderson is a 27 y.o. year old G30P0020 female at 70w3dweeks gestation who presents to MAU reporting itching on abdomen x 2 wks that has now affected both arms and middle part of back since last night; no bumps. She denies any itching on palms of hands or soles of feet. She denies no VB or LOF. She reports good (+) FM today. She has a h/o of pregnancy loss of twins at 19 wks. She received weekly Makena injections. She has a h/o of cholecystectomy in 02/2017. She "Googled sx's and was concerned something could be wrong." [facility] She has an appointment on 02/25/2018.  Past Medical History:  Diagnosis Date  . Abnormal Pap smear   . Anemia   . Bronchitis    x 1 time  . Infertility, female   . Ovarian cyst   . PCOS (polycystic ovarian syndrome)   . UTI (lower urinary tract infection)     Past Surgical History:  Procedure Laterality Date  . CERVICAL CERCLAGE    . CERVICAL CERCLAGE N/A 09/13/2017   Procedure: CERCLAGE CERVICAL;  Surgeon: DCrawford Givens MD;  [facility];  Service: Gynecology;  Laterality: N/A;  . CHOLECYSTECTOMY    . LAPAROSCOPIC CHOLECYSTECTOMY SINGLE SITE WITH INTRAOPERATIVE CHOLANGIOGRAM N/A 03/01/2017   Procedure: LAPAROSCOPIC CHOLECYSTECTOMY SINGLE SITE WITH INTRAOPERATIVE CHOLANGIOGRAM;  Surgeon: GMichael Boston MD;  Location[facility];  Service: General;  Laterality: N/A;  . LAPAROSCOPIC OVARIAN CYSTECTOMY Left 01/07/2015   Procedure: Attempted LAPAROSCOPIC OVARIAN CYSTECTOMY, Mini laparotomy with excision of left ovarian cyst;  Surgeon: PMora Bellman MD;  [facility];  Service: Gynecology;  Laterality: Left;  Requested 01-26-15 @ 3:30p  . WISDOM TOOTH EXTRACTION      Family History  Problem Relation Age of Onset  . Healthy Mother    . Healthy Father   . Hypertension Paternal Grandmother   . Diabetes Paternal Grandfather   . Other Neg Hx     Social History   Tobacco Use  . Smoking status: Never Smoker  . Smokeless tobacco: Never Used  Substance Use Topics  . Alcohol use: Not Currently    Comment: occassional   . Drug use: No    Allergies:  Allergies  Allergen Reactions  . Vicodin [Hydrocodone-Acetaminophen] Nausea And Vomiting and Other (See Comments)    Pt states does not cause swelling    Medications Prior to Admission  Medication Sig Dispense Refill Last Dose  . hydroxyprogesterone caproate (MAKENA) 250 mg/mL OIL injection Inject 250 mg into the muscle once.   Taking  . IRON PO Take by mouth.   Taking  . Prenat w/o A Vit-FeFum-FePo-FA (CONCEPT OB) 130-92.4-1 MG CAPS Take 1 capsule by mouth daily. (Patient not taking: Reported on 11/02/2017) 30 capsule 11 Not Taking  . progesterone 200 MG SUPP Place 200 mg vaginally at bedtime.   Taking    Review of Systems  Constitutional: Negative.   HENT: Negative.   Eyes: Negative.   Respiratory: Negative.   Cardiovascular: Negative.   Gastrointestinal: Negative.   Endocrine: Negative.   Genitourinary: Negative.        "Some BH ctx's"  Musculoskeletal: Negative.   Skin: Positive for rash (itching on belly, arms and back).  Allergic/Immunologic: Negative.  Neurological: Negative.   Hematological: Negative.   Psychiatric/Behavioral: Negative.    Physical Exam   Blood pressure 119/87, pulse 96, temperature 98.3 F (36.8 C), temperature source Oral, resp. rate 18, last menstrual period 06/07/2017.  Physical Exam  Nursing note and vitals reviewed. Constitutional: She is oriented to person, place, and time. She appears well-developed and well-nourished.  HENT:  Head: Normocephalic and atraumatic.  Eyes: Pupils are equal, round, and reactive to light.  Neck: Normal range of motion.  Cardiovascular: Normal rate and regular rhythm.  Respiratory: Effort  normal.  GI: Soft.  Musculoskeletal: Normal range of motion.  Neurological: She is alert and oriented to person, place, and time.  Skin: Skin is warm and dry. Rash noted. There is erythema.  Small pinpoint bumps noted on forearms, middle portion of back and in striae of abdomen  Psychiatric: She has a normal mood and affect. Her behavior is normal. Judgment and thought content normal.    MAU Course  Procedures  MDM [facility] CMP  Offered Benadryl to possibly improve itching -- pt declines d/t "cost too much in hospital" Bile Acids -- pending to R/O cholestasis of pregnancy  *Consult with Dr. Rip Harbour @ 1130 - notified of patient's complaints, assessments, lab & NST results, recommended tx plan d/c home, have her try OTC Benadryl as directed, keep OB appt tomorrow & let OB know bile acids were drawn - ok to d/c home  Results for orders placed or performed during the hospital encounter of 02/24/18 (from the past 24 hour(s))  Urinalysis, Routine w reflex microscopic     Status: Abnormal   Collection Time: 02/24/18 10:02 AM  Result Value Ref Range   Color, Urine YELLOW YELLOW   APPearance HAZY (A) CLEAR   Specific Gravity, Urine 1.013 1.005 - 1.030   pH 7.0 5.0 - 8.0   Glucose, UA NEGATIVE NEGATIVE mg/dL   Hgb urine dipstick NEGATIVE NEGATIVE   Bilirubin Urine NEGATIVE NEGATIVE   Ketones, ur NEGATIVE NEGATIVE mg/dL   Protein, ur NEGATIVE NEGATIVE mg/dL   Nitrite NEGATIVE NEGATIVE   Leukocytes, UA LARGE (A) NEGATIVE   RBC / HPF 0-5 0 - 5 RBC/hpf   WBC, UA 6-10 0 - 5 WBC/hpf   Bacteria, UA MANY (A) NONE SEEN   Squamous Epithelial / LPF 0-5 0 - 5   Mucus PRESENT   Comprehensive metabolic panel     Status: Abnormal   Collection Time: 02/24/18 10:33 AM  Result Value Ref Range   Sodium 136 135 - 145 mmol/L   Potassium 3.9 3.5 - 5.1 mmol/L   Chloride 107 98 - 111 mmol/L   CO2 21 (L) 22 - 32 mmol/L   Glucose, Bld 81 70 - 99 mg/dL   BUN 8 6 - 20 mg/dL   Creatinine, Ser 0.54 0.44 - 1.00  mg/dL   Calcium 9.1 8.9 - 10.3 mg/dL   Total Protein 6.5 6.5 - 8.1 g/dL   Albumin 3.2 (L) 3.5 - 5.0 g/dL   AST 17 15 - 41 U/L   ALT 17 0 - 44 U/L   Alkaline Phosphatase 85 38 - 126 U/L   Total Bilirubin 0.4 0.3 - 1.2 mg/dL   GFR calc non Af Amer >60 >60 mL/min   GFR calc Af Amer >60 >60 mL/min   Anion gap 8 5 - 15    Assessment and Plan  PUPP (pruritic urticarial papules and plaques of pregnancy) - Plan: Discharge patient - Advised to take OTC Benadryl 25 mg q6 prn  itching - Recommend Hydrocortisone 1% cream on affceted areas of skin - Notify OB, if sx's worsen or not improved. - Discharge home - Keep scheduled appt with [facility] on 02/25/18 - Patient verbalized an understanding of the plan of care and agrees.  Laury Deep, MSN, CNM 02/24/2018, 10:13 AM

## 2018-02-24 NOTE — MAU Note (Signed)
Stomach has been itching for the past couple weeks and started on the rest of her body last night, reports no bumps  No LOF, no bleeding, + FM

## 2018-02-25 LAB — BILE ACIDS, TOTAL: BILE ACIDS TOTAL: 2.5 umol/L (ref 0.0–10.0)

## 2018-03-14 ENCOUNTER — Telehealth (HOSPITAL_COMMUNITY): Payer: Self-pay | Admitting: *Deleted

## 2018-03-14 ENCOUNTER — Encounter (HOSPITAL_COMMUNITY): Payer: Self-pay | Admitting: *Deleted

## 2018-03-14 NOTE — Telephone Encounter (Signed)
Preadmission screen  

## 2018-03-18 ENCOUNTER — Other Ambulatory Visit: Payer: Self-pay

## 2018-03-18 ENCOUNTER — Encounter (HOSPITAL_COMMUNITY): Payer: Self-pay | Admitting: *Deleted

## 2018-03-18 ENCOUNTER — Inpatient Hospital Stay (HOSPITAL_COMMUNITY): Payer: BLUE CROSS/BLUE SHIELD | Admitting: Anesthesiology

## 2018-03-18 ENCOUNTER — Inpatient Hospital Stay (HOSPITAL_COMMUNITY)
Admission: AD | Admit: 2018-03-18 | Discharge: 2018-03-21 | DRG: 807 | Disposition: A | Discharge status: Home / Self Care | Payer: BLUE CROSS/BLUE SHIELD | Attending: Obstetrics and Gynecology | Admitting: Obstetrics and Gynecology

## 2018-03-18 DIAGNOSIS — Q51810 Arcuate uterus: Secondary | ICD-10-CM | POA: Diagnosis not present

## 2018-03-18 DIAGNOSIS — D649 Anemia, unspecified: Secondary | ICD-10-CM | POA: Diagnosis present

## 2018-03-18 DIAGNOSIS — O480 Post-term pregnancy: Secondary | ICD-10-CM | POA: Diagnosis present

## 2018-03-18 DIAGNOSIS — O99214 Obesity complicating childbirth: Secondary | ICD-10-CM | POA: Diagnosis present

## 2018-03-18 DIAGNOSIS — O9902 Anemia complicating childbirth: Secondary | ICD-10-CM | POA: Diagnosis present

## 2018-03-18 DIAGNOSIS — O3403 Maternal care for unspecified congenital malformation of uterus, third trimester: Secondary | ICD-10-CM | POA: Diagnosis present

## 2018-03-18 DIAGNOSIS — Z3490 Encounter for supervision of normal pregnancy, unspecified, unspecified trimester: Secondary | ICD-10-CM | POA: Diagnosis present

## 2018-03-18 DIAGNOSIS — E669 Obesity, unspecified: Secondary | ICD-10-CM | POA: Diagnosis present

## 2018-03-18 DIAGNOSIS — Z3A40 40 weeks gestation of pregnancy: Secondary | ICD-10-CM | POA: Diagnosis not present

## 2018-03-18 DIAGNOSIS — O134 Gestational [pregnancy-induced] hypertension without significant proteinuria, complicating childbirth: Principal | ICD-10-CM | POA: Diagnosis present

## 2018-03-18 DIAGNOSIS — O163 Unspecified maternal hypertension, third trimester: Secondary | ICD-10-CM

## 2018-03-18 DIAGNOSIS — O2686 Pruritic urticarial papules and plaques of pregnancy (PUPPP): Secondary | ICD-10-CM

## 2018-03-18 HISTORY — DX: Anogenital herpesviral infection, unspecified: A60.9

## 2018-03-18 LAB — COMPREHENSIVE METABOLIC PANEL
ALBUMIN: 3 g/dL — AB (ref 3.5–5.0)
ALK PHOS: 105 U/L (ref 38–126)
ALT: 17 U/L (ref 0–44)
AST: 16 U/L (ref 15–41)
Anion gap: 9 (ref 5–15)
BUN: 13 mg/dL (ref 6–20)
CALCIUM: 9.3 mg/dL (ref 8.9–10.3)
CO2: 22 mmol/L (ref 22–32)
CREATININE: 0.68 mg/dL (ref 0.44–1.00)
Chloride: 104 mmol/L (ref 98–111)
GFR calc Af Amer: >60 mL/min (ref >60)
GFR calc non Af Amer: >60 mL/min (ref >60)
GLUCOSE: 127 mg/dL — AB (ref 70–99)
Potassium: 4 mmol/L (ref 3.5–5.1)
SODIUM: 135 mmol/L (ref 135–145)
Total Bilirubin: 0.3 mg/dL (ref 0.3–1.2)
Total Protein: 6 g/dL — ABNORMAL LOW (ref 6.5–8.1)

## 2018-03-18 LAB — CBC
HCT: 31.9 % — ABNORMAL LOW (ref 36.0–46.0)
HEMOGLOBIN: 10.9 g/dL — AB (ref 12.0–15.0)
MCH: 31.5 pg (ref 26.0–34.0)
MCHC: 34.2 g/dL (ref 30.0–36.0)
MCV: 92.2 fL (ref 80.0–100.0)
Platelets: 173 10*3/uL (ref 150–400)
RBC: 3.46 MIL/uL — AB (ref 3.87–5.11)
RDW: 13.4 % (ref 11.5–15.5)
WBC: 5.2 10*3/uL (ref 4.0–10.5)

## 2018-03-18 LAB — PROTEIN / CREATININE RATIO, URINE
Creatinine, Urine: 83 mg/dL
PROTEIN CREATININE RATIO: 0.1 mg/mg{creat} (ref 0.00–0.15)
TOTAL PROTEIN, URINE: 8 mg/dL

## 2018-03-18 LAB — TYPE AND SCREEN
ABO/RH(D): A POS
ANTIBODY SCREEN: NEGATIVE

## 2018-03-18 LAB — POCT FERN TEST: POCT Fern Test: NEGATIVE

## 2018-03-18 LAB — AMNISURE RUPTURE OF MEMBRANE (ROM) NOT AT ARMC: Amnisure ROM: NEGATIVE

## 2018-03-18 MED ORDER — FENTANYL 2.5 MCG/ML BUPIVACAINE 1/10 % EPIDURAL INFUSION (WH - ANES)
14.0000 mL/h | INTRAMUSCULAR | Status: DC | PRN
  Administered 2018-03-18 (×3): 14 mL/h via EPIDURAL
  Filled 2018-03-18 (×3): qty 100

## 2018-03-18 MED ORDER — LABETALOL HCL 5 MG/ML IV SOLN: 40.0000 mg | INTRAVENOUS | Status: DC | PRN

## 2018-03-18 MED ORDER — LACTATED RINGERS IV SOLN: 500.0000 mL | INTRAVENOUS | Status: DC | PRN

## 2018-03-18 MED ORDER — PHENYLEPHRINE 40 MCG/ML (10ML) SYRINGE FOR IV PUSH (FOR BLOOD PRESSURE SUPPORT)
80.0000 ug | PREFILLED_SYRINGE | INTRAVENOUS | Status: DC | PRN
  Filled 2018-03-18: qty 5
  Filled 2018-03-18 (×2): qty 10

## 2018-03-18 MED ORDER — FENTANYL CITRATE (PF) 100 MCG/2ML IJ SOLN
100.0000 ug | Freq: Once | INTRAMUSCULAR | Status: AC
  Administered 2018-03-18: 100 ug via EPIDURAL

## 2018-03-18 MED ORDER — SOD CITRATE-CITRIC ACID 500-334 MG/5ML PO SOLN: 30.0000 mL | ORAL | Status: DC | PRN

## 2018-03-18 MED ORDER — BUTORPHANOL TARTRATE 1 MG/ML IJ SOLN
1.0000 mg | INTRAMUSCULAR | Status: DC | PRN
  Administered 2018-03-18: 1 mg via INTRAVENOUS
  Filled 2018-03-18: qty 1

## 2018-03-18 MED ORDER — HYDRALAZINE HCL 20 MG/ML IJ SOLN: 10.0000 mg | INTRAMUSCULAR | Status: DC | PRN

## 2018-03-18 MED ORDER — FENTANYL CITRATE (PF) 100 MCG/2ML IJ SOLN
INTRAMUSCULAR | Status: DC | PRN
  Administered 2018-03-18: 100 ug via EPIDURAL

## 2018-03-18 MED ORDER — EPHEDRINE 5 MG/ML INJ
10.0000 mg | INTRAVENOUS | Status: DC | PRN
  Filled 2018-03-18: qty 2

## 2018-03-18 MED ORDER — LACTATED RINGERS IV SOLN: 500.0000 mL | Freq: Once | INTRAVENOUS | Status: DC

## 2018-03-18 MED ORDER — OXYTOCIN BOLUS FROM INFUSION
500.0000 mL | Freq: Once | INTRAVENOUS | Status: AC
  Administered 2018-03-19: 500 mL via INTRAVENOUS

## 2018-03-18 MED ORDER — LIDOCAINE HCL (PF) 1 % IJ SOLN
INTRAMUSCULAR | Status: DC | PRN
  Administered 2018-03-18: 5 mL via EPIDURAL
  Administered 2018-03-18: 4 mL via EPIDURAL

## 2018-03-18 MED ORDER — LABETALOL HCL 5 MG/ML IV SOLN: 80.0000 mg | INTRAVENOUS | Status: DC | PRN

## 2018-03-18 MED ORDER — PHENYLEPHRINE 40 MCG/ML (10ML) SYRINGE FOR IV PUSH (FOR BLOOD PRESSURE SUPPORT)
80.0000 ug | PREFILLED_SYRINGE | INTRAVENOUS | Status: DC | PRN
  Filled 2018-03-18: qty 5

## 2018-03-18 MED ORDER — LIDOCAINE HCL (PF) 1 % IJ SOLN
30.0000 mL | INTRAMUSCULAR | Status: AC | PRN
  Administered 2018-03-19: 30 mL via SUBCUTANEOUS
  Filled 2018-03-18 (×2): qty 30

## 2018-03-18 MED ORDER — PRENATAL MULTIVITAMIN CH
1.0000 | ORAL_TABLET | Freq: Every day | ORAL | Status: DC
  Administered 2018-03-18: 1 via ORAL
  Filled 2018-03-18 (×2): qty 1

## 2018-03-18 MED ORDER — ACETAMINOPHEN 325 MG PO TABS
650.0000 mg | ORAL_TABLET | ORAL | Status: DC | PRN
  Administered 2018-03-18 (×2): 650 mg via ORAL
  Filled 2018-03-18 (×2): qty 2

## 2018-03-18 MED ORDER — LIDOCAINE-EPINEPHRINE (PF) 2 %-1:200000 IJ SOLN
INTRAMUSCULAR | Status: DC | PRN
  Administered 2018-03-18: 6 mL via EPIDURAL

## 2018-03-18 MED ORDER — LACTATED RINGERS IV SOLN
INTRAVENOUS | Status: DC
  Administered 2018-03-18 (×2): via INTRAVENOUS

## 2018-03-18 MED ORDER — FENTANYL CITRATE (PF) 100 MCG/2ML IJ SOLN
INTRAMUSCULAR | Status: AC
  Administered 2018-03-18: 100 ug via EPIDURAL
  Filled 2018-03-18: qty 2

## 2018-03-18 MED ORDER — OXYTOCIN 40 UNITS IN LACTATED RINGERS INFUSION - SIMPLE MED
1.0000 m[IU]/min | INTRAVENOUS | Status: DC
  Administered 2018-03-18: 2 m[IU]/min via INTRAVENOUS

## 2018-03-18 MED ORDER — VALACYCLOVIR HCL 500 MG PO TABS
1000.0000 mg | ORAL_TABLET | Freq: Two times a day (BID) | ORAL | Status: DC
  Administered 2018-03-18: 500 mg via ORAL
  Filled 2018-03-18: qty 2

## 2018-03-18 MED ORDER — OXYTOCIN 40 UNITS IN LACTATED RINGERS INFUSION - SIMPLE MED
INTRAVENOUS | Status: AC
  Administered 2018-03-18: 2 m[IU]/min via INTRAVENOUS
  Filled 2018-03-18: qty 1000

## 2018-03-18 MED ORDER — OXYTOCIN 40 UNITS IN LACTATED RINGERS INFUSION - SIMPLE MED
2.5000 [IU]/h | INTRAVENOUS | Status: DC
  Administered 2018-03-19: 2.5 [IU]/h via INTRAVENOUS

## 2018-03-18 MED ORDER — TERBUTALINE SULFATE 1 MG/ML IJ SOLN
0.2500 mg | Freq: Once | INTRAMUSCULAR | Status: DC | PRN
  Filled 2018-03-18: qty 1

## 2018-03-18 MED ORDER — VALACYCLOVIR HCL 500 MG PO TABS
500.0000 mg | ORAL_TABLET | Freq: Two times a day (BID) | ORAL | Status: DC
  Administered 2018-03-19: 500 mg via ORAL
  Filled 2018-03-18 (×4): qty 1

## 2018-03-18 MED ORDER — LABETALOL HCL 5 MG/ML IV SOLN: 20.0000 mg | INTRAVENOUS | Status: DC | PRN

## 2018-03-18 MED ORDER — SODIUM CHLORIDE 0.9 % IV SOLN
3.0000 g | Freq: Four times a day (QID) | INTRAVENOUS | Status: DC
  Administered 2018-03-18 (×2): 3 g via INTRAVENOUS
  Filled 2018-03-18 (×4): qty 3

## 2018-03-18 MED ORDER — ONDANSETRON HCL 4 MG/2ML IJ SOLN
4.0000 mg | Freq: Four times a day (QID) | INTRAMUSCULAR | Status: DC | PRN
  Administered 2018-03-18: 4 mg via INTRAVENOUS
  Filled 2018-03-18: qty 2

## 2018-03-18 MED ORDER — OXYCODONE-ACETAMINOPHEN 5-325 MG PO TABS: 2.0000 | ORAL_TABLET | ORAL | Status: DC | PRN

## 2018-03-18 MED ORDER — OXYCODONE-ACETAMINOPHEN 5-325 MG PO TABS: 1.0000 | ORAL_TABLET | ORAL | Status: DC | PRN

## 2018-03-18 MED ORDER — DIPHENHYDRAMINE HCL 50 MG/ML IJ SOLN: 12.5000 mg | INTRAMUSCULAR | Status: DC | PRN

## 2018-03-18 MED ORDER — FENTANYL CITRATE (PF) 100 MCG/2ML IJ SOLN: 100.0000 ug | INTRAMUSCULAR | Status: DC | PRN

## 2018-03-18 MED ORDER — OXYTOCIN 40 UNITS IN LACTATED RINGERS INFUSION - SIMPLE MED
1.0000 m[IU]/min | INTRAVENOUS | Status: DC
  Administered 2018-03-18: 1 m[IU]/min via INTRAVENOUS

## 2018-03-18 NOTE — Progress Notes (Signed)
Labor Progress Note  Carla Henderson is a 27 y.o. G3P0020 at 66w4ddmitted for induction of labor due to Hypertension.  Subjective: I assumed care for pt in NAD, pt resting in bed in stable condition, husband and pt worried about maternal fever, pt stated she does not think it is a virus or flu, pt endorses she spiked her temp post epidural placement. Pt and husband are very anxious about everything. Comfortable with epidural , but feels pressure with cxt. Only.  Patient Active Problem List   Diagnosis Date Noted  . Encounter for elective induction of labor 03/18/2018  . Encounter for induction of labor 03/18/2018  . PUPP (pruritic urticarial papules and plaques of pregnancy) 02/24/2018  . Polycystic ovaries 03/01/2017  . Elevated LFTs 03/01/2017  . Acute calculous cholecystitis s/p lap cholecystectomy 03/01/2017 03/01/2017  . Steatohepatitis, nonalcoholic 117/00/1749 . Symptomatic cholelithiasis 02/28/2017  . 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 H/O incompetent cervix, currently pregnant--hx loss of 18-20 week twins 2017, cerclage then 11/01/2016  . Infertility, female 11/01/2016  . History of gestational diabetes 11/01/2016  . 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 History of gestational diabetes mellitus 11/01/2016  . Overweight 08/17/2015   Objective: BP (!) 152/94   Pulse 100   Temp 99.6 F (37.6 C) (Oral)   Resp 18   Ht 5' 9"  (1.753 m)   Wt 112.9 kg   LMP 06/07/2017   SpO2 99%   BMI 36.77 kg/m  I/O last 3 completed shifts: In: -  Out: 1600 [Urine:1600] No intake/output data recorded. NST: FHR baseline 150s bpm, Variability: moderate, Accelerations:present, Decelerations:  Absent= Cat 1/Reactive CTX:  irregular, every 2-5 minutes, lasting 60-90 Uterus gravid, soft non tender, moderate to palpate with contractions.  SVE:  Dilation: 8 Effacement (%): 90 Station: 0 Exam by:: Keshawna Dix CNM Pitocin at 2 mUn/min MVU: 170  Assessment:  JArthor Captain 27y.o., G3P0020, with  an IUP @ 469w4dpresented for induction of labor due to Hypertension. Pt stable feeling pressure with cxt only with epidural in. Pt anxious and worried about everything.  Patient Active Problem List   Diagnosis Date Noted  . Encounter for elective induction of labor 03/18/2018  . Encounter for induction of labor 03/18/2018  . PUPP (pruritic urticarial papules and plaques of pregnancy) 02/24/2018  . Polycystic ovaries 03/01/2017  . Elevated LFTs 03/01/2017  . Acute calculous cholecystitis s/p lap cholecystectomy 03/01/2017 03/01/2017  . Steatohepatitis, nonalcoholic 1144/96/7591. Symptomatic cholelithiasis 02/28/2017  . 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 H/O incompetent cervix, currently pregnant--hx loss of 18-20 week twins 2017, cerclage then 11/01/2016  . Infertility, female 11/01/2016  . History of gestational diabetes 11/01/2016  . 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 History of gestational diabetes mellitus 11/01/2016  . Overweight 08/17/2015   NICHD: Category 1  Membranes:  SROM, clear x 16hrs, s/sx of maternal temp was elevated to TMax101.2 @ 1500, unasyn given @ 1503, and tylenol given at 1356 & 1825, maternal temp now 99.6, pt feels warm to touch. No fetal tachy or maternal tachy noted at this time.   IUPC: MVUs 170  Asynclitic position was noted earlier, now caput felt after repositioning, and asynclitic appears to have resolved.   Induction:    Cytotec x0  Foley Bulb: Never inserted   Pitocin - 2  Pain management:               IV pain management: x 1 @ 0544 stadol 62m85mNitrous: None  Epidural placement:  at 11/25 on 0700  GBS Negative  GHTN: Current BP 152/94, denies HA, vision changes, no RUQ pain.   Plan: Continue labor plan Continuous/intermittent monitoring Rest/Frequent position changes to facilitate fetal rotation and descent, use peanut ball. Will reassess with cervical exam at 1000 or earlier if necessary Continue pitocin per  protocol Suspected chorio: Continue Unasyn until delivery  GHTN: Continue to monitor BP.  Anticipate labor progression and vaginal delivery.   Noralyn Pick, NP-C, CNM, MSN 03/18/2018. 9:09 PM

## 2018-03-18 NOTE — H&P (Addendum)
Carla Henderson is a 27 y.o. female, G3P0020 at 40+4 weeks of pregnancy (by LMP and 6 week [place]) presenting for leaking of fluid. She reported a gush of fluid at 0015 followed by trickling. Her membranes are intact: fern test negative, Amnisure negative. B/Ps elevated in MAU. [place] labs negative. PCR 0.10. Denies headache, epigastric pain, scotoma. Reports +FM, mild contractions. Admit for induction for gestational hypertension.  OB History    Gravida  3   Para  0   Term      Preterm      AB  2   Living  0     SAB  2   TAB      Ectopic      Multiple      Live Births             Past Medical History:  Diagnosis Date  . Abnormal Pap smear   . Anemia   . Bronchitis    x 1 time  . HSV (herpes simplex virus) anogenital infection   . Infertility, female   . Ovarian cyst   . PCOS (polycystic ovarian syndrome)   . UTI (lower urinary tract infection)    Past Surgical History:  Procedure Laterality Date  . CERVICAL CERCLAGE    . CERVICAL CERCLAGE N/A 09/13/2017   Procedure: CERCLAGE CERVICAL;  Surgeon: Crawford Givens, MD;  Location: [place];  Service: Gynecology;  Laterality: N/A;  . CHOLECYSTECTOMY    . LAPAROSCOPIC CHOLECYSTECTOMY SINGLE SITE WITH INTRAOPERATIVE CHOLANGIOGRAM N/A 03/01/2017   Procedure: LAPAROSCOPIC CHOLECYSTECTOMY SINGLE SITE WITH INTRAOPERATIVE CHOLANGIOGRAM;  Surgeon: Michael Boston, MD;  Location: [facility];  Service: General;  Laterality: N/A;  . LAPAROSCOPIC OVARIAN CYSTECTOMY Left 01/07/2015   Procedure: Attempted LAPAROSCOPIC OVARIAN CYSTECTOMY, Mini laparotomy with excision of left ovarian cyst;  Surgeon: Mora Bellman, MD;  Location: [facility];  Service: Gynecology;  Laterality: Left;  Requested 01-26-15 @ 3:30p  . WISDOM TOOTH EXTRACTION     Results for orders placed or performed during the hospital encounter of 03/18/18 (from the past 24 hour(s))  Protein / creatinine ratio, urine     Status: None   Collection Time: 03/18/18 12:54 AM  Result Value Ref  Range   Creatinine, Urine 83.00 mg/dL   Total Protein, Urine 8 mg/dL   Protein Creatinine Ratio 0.10 0.00 - 0.15 mg/mg[Cre]  CBC     Status: Abnormal   Collection Time: 03/18/18  1:13 AM  Result Value Ref Range   WBC 5.2 4.0 - 10.5 K/uL   RBC 3.46 (L) 3.87 - 5.11 MIL/uL   Hemoglobin 10.9 (L) 12.0 - 15.0 g/dL   HCT 31.9 (L) 36.0 - 46.0 %   MCV 92.2 80.0 - 100.0 fL   MCH 31.5 26.0 - 34.0 pg   MCHC 34.2 30.0 - 36.0 g/dL   RDW 13.4 11.5 - 15.5 %   Platelets 173 150 - 400 K/uL  Comprehensive metabolic panel     Status: Abnormal   Collection Time: 03/18/18  1:13 AM  Result Value Ref Range   Sodium 135 135 - 145 mmol/L   Potassium 4.0 3.5 - 5.1 mmol/L   Chloride 104 98 - 111 mmol/L   CO2 22 22 - 32 mmol/L   Glucose, Bld 127 (H) 70 - 99 mg/dL   BUN 13 6 - 20 mg/dL   Creatinine, Ser 0.68 0.44 - 1.00 mg/dL   Calcium 9.3 8.9 - 10.3 mg/dL   Total Protein 6.0 (L) 6.5 - 8.1 g/dL  Albumin 3.0 (L) 3.5 - 5.0 g/dL   AST 16 15 - 41 U/L   ALT 17 0 - 44 U/L   Alkaline Phosphatase 105 38 - 126 U/L   Total Bilirubin 0.3 0.3 - 1.2 mg/dL   GFR calc non Af Amer >60 >60 mL/min   GFR calc Af Amer >60 >60 mL/min   Anion gap 9 5 - 15  Fern Test     Status: None   Collection Time: 03/18/18  1:49 AM  Result Value Ref Range   POCT Fern Test Negative = intact amniotic membranes   Amnisure rupture of membrane (rom)not at [facility]     Status: None   Collection Time: 03/18/18  1:50 AM  Result Value Ref Range   Amnisure ROM NEGATIVE   .vit  Family History: family history includes Diabetes in her paternal grandfather; Healthy in her father and mother; Hypertension in her paternal grandmother. Social History:  reports that she has never smoked. She has never used smokeless tobacco. She reports that she drank alcohol. She reports that she does not use drugs.  Pt is single, African American, supportive partner at [facility].    Maternal Diabetes: No Genetic Screening: Normal Maternal Ultrasounds/Referrals:  Normal Fetal Ultrasounds or other Referrals:  None Maternal Substance Abuse:  No Significant Maternal Medications:  Meds include: Other:  Valtrex Significant Maternal Lab Results:  None Other Comments:  None  Maternal Medical History:  Fetal activity: Perceived fetal activity is normal.   Last perceived fetal movement was within the past hour.    Prenatal complications: no prenatal complications Prenatal Complications - Diabetes: none.   Hx of cervical incompetence, PPROM/SAB at 18 & 20 weeks (twins) 2017 17p and vaginal progesterone this pregnancy with cerclage. Cerclage placed 09/13/17, removed 12/07/17. Dilation: 4 Effacement (%): 50 Station: -3 Exam by:: Arbie Cookey H CNM  Blood pressure (!) 150/95, pulse 90, temperature 98.5 F (36.9 C), temperature source Oral, resp. rate 18, height 5' 9"  (1.753 m), weight 112.9 kg, last menstrual period 06/07/2017, SpO2 98 %, unknown if currently breastfeeding.    Maternal Exam:  Abdomen: Patient reports no abdominal tenderness. Introitus: Normal vulva. Normal vagina.  Ferning test: negative.   Cervix: Cervix evaluated by digital exam.     Fetal Exam Fetal Monitor Review: Baseline rate: 135.  Variability: moderate (6-25 bpm).   Pattern: accelerations present and no decelerations.    Fetal State Assessment: Category I - tracings are normal.     Physical Exam  Constitutional: She is oriented to person, place, and time. She appears well-developed.  HENT:  Head: Normocephalic.  Eyes: Pupils are equal, round, and reactive to light.  Neck: Normal range of motion.  Cardiovascular: Normal rate, regular rhythm, normal heart sounds and intact distal pulses.  Respiratory: Effort normal and breath sounds normal.  GI: Soft.  Genitourinary: Vagina normal and uterus normal.  Musculoskeletal: Normal range of motion.  Neurological: She is alert and oriented to person, place, and time.  Skin: Skin is warm and dry.  Psychiatric: She has a normal  mood and affect. Her behavior is normal. Judgment and thought content normal.   Per MAU provider: no HSV lesions by spec exam.   Prenatal labs: ABO, Rh: --/--/A POS (05/23 1100) Antibody: NEG (05/23 1100) Rubella: Immune (04/08 0000) RPR: Nonreactive (04/08 0000)  HBsAg: Negative (04/08 0000)  HIV: Non-reactive (04/08 0000)  GBS: Negative (10/23 0000)   Assessment IOL for GHTN at term 40+4 weeks NICHD category 1 tracing, occasional contractions 4/50/-3  Plan: Monitor BP closely Pitocin for induction Routine [facility] orders Anticipate vaginal delivery  Altha Harm 03/18/2018, 3:57 AM

## 2018-03-18 NOTE — MAU Note (Signed)
Urine sent to lab 

## 2018-03-18 NOTE — Progress Notes (Signed)
Carla Henderson is a 27 y.o. G3P0020 at 48w4ddmitted for induction of labor due to Hypertension.  Subjective: Patient resting comfortably. She had been complaining of increased pressure and discomfort and RN gave epidural boluses. When that did not work, anesthesia was called and identified that the epidural catheter had become disconnected. She was reconnected and re-dosed and is now comfortable again. I encouraged her to rest if possible.   Objective: BP (!) 151/68   Pulse (!) 116   Temp 99.1 F (37.3 C) (Axillary)   Resp 18   Ht 5' 9"  (1.753 m)   Wt 112.9 kg   LMP 06/07/2017   SpO2 99%   BMI 36.77 kg/m  No intake/output data recorded. Total I/O In: -  Out: 950 [Urine:950]  FHT:  FHR: 150 bpm, variability: moderate,  accelerations:  Present,  decelerations:  Present occasional mild variable UC:   irregular, every 1-3 minutes SVE:   Dilation: 5 Effacement (%): 80 Station: -2 Exam by:: Carla Parody, RN   Labs: Lab Results  Component Value Date   WBC 5.2 03/18/2018   HGB 10.9 (L) 03/18/2018   HCT 31.9 (L) 03/18/2018   MCV 92.2 03/18/2018   PLT 173 03/18/2018   Assessment / Plan: Induction of labor due to gestational hypertension,  progressing well on pitocin  Labor: Progressing normally Preeclampsia:  no signs or symptoms of toxicity, intake and ouput balanced and labs stable Fetal Wellbeing:  Category II but overall reassuring  Pain Control:  Epidural I/D:  n/a Anticipated MOD:  NSVD  Carla Alar11/25/2019, 12:15 PM

## 2018-03-18 NOTE — Progress Notes (Signed)
Pt refuses Flu swab

## 2018-03-18 NOTE — MAU Note (Addendum)
Pt states she was asleep and felt a gush of fluid come out around 12:15a. States her panties were wet and went to the bathroom and had some yellow/clear fluid come out. Pt not wearing a pad. Pt denies vaginal bleeding. Reports some intermittent lower back pain-dull, achy. Reports feeling baby move last around 10:30pm

## 2018-03-18 NOTE — Progress Notes (Signed)
Carla Henderson is a 27 y.o. G3P0020 at 63w4ddmitted for induction of labor due to Hypertension.  Subjective: Patient resting comfortably after epidural. Will restart pitocin and let patient rest.   Objective: BP (!) 142/74   Pulse 80   Temp 98.1 F (36.7 C) (Oral)   Resp 18   Ht 5' 9"  (1.753 m)   Wt 112.9 kg   LMP 06/07/2017   SpO2 98%   BMI 36.77 kg/m  No intake/output data recorded. No intake/output data recorded.  FHT:  FHR: 135 bpm, variability: moderate,  accelerations:  Present,  decelerations:  Absent UC:   irregular, every 2-4 minutes SVE:   Dilation: 4 Effacement (%): 50 Station: -3 Exam by:: CAltha HarmCNM   Labs: Lab Results  Component Value Date   WBC 5.2 03/18/2018   HGB 10.9 (L) 03/18/2018   HCT 31.9 (L) 03/18/2018   MCV 92.2 03/18/2018   PLT 173 03/18/2018   Assessment / Plan: Induction of labor due to gestational hypertension,  progressing well on pitocin  Labor: Progressing normally Preeclampsia:  no signs or symptoms of toxicity, intake and ouput balanced and labs stable Fetal Wellbeing:  Category I Pain Control:  Epidural I/D:  n/a Anticipated MOD:  NSVD  EMarikay Alar11/25/2019, 7:43 AM

## 2018-03-18 NOTE — MAU Provider Note (Signed)
History     CSN: [id number]  Arrival date and time: 03/18/18 9470   First Provider Initiated Contact with Patient 03/18/18 0146      Chief Complaint  Patient presents with  . Rupture of Membranes   HPI Carla Henderson is a 27 y.o. G3P0020 at 4w4dwho presents with possible rupture of membranes. She states she had a big gush of fluid at 1215 and has continued to trickle. Denies any pain or bleeding. Reports normal fetal movement.   Upon arrival to MAU, she was found to have a new onset of hypertension. She denies any headache, visual changes or epigastric pain.   OB History    Gravida  3   Para  0   Term      Preterm      AB  2   Living  0     SAB  2   TAB      Ectopic      Multiple      Live Births              Past Medical History:  Diagnosis Date  . Abnormal Pap smear   . Anemia   . Bronchitis    x 1 time  . HSV (herpes simplex virus) anogenital infection   . Infertility, female   . Ovarian cyst   . PCOS (polycystic ovarian syndrome)   . UTI (lower urinary tract infection)     Past Surgical History:  Procedure Laterality Date  . CERVICAL CERCLAGE    . CERVICAL CERCLAGE N/A 09/13/2017   Procedure: CERCLAGE CERVICAL;  Surgeon: DCrawford Givens MD;  [facility];  Service: Gynecology;  Laterality: N/A;  . CHOLECYSTECTOMY    . LAPAROSCOPIC CHOLECYSTECTOMY SINGLE SITE WITH INTRAOPERATIVE CHOLANGIOGRAM N/A 03/01/2017   Procedure: LAPAROSCOPIC CHOLECYSTECTOMY SINGLE SITE WITH INTRAOPERATIVE CHOLANGIOGRAM;  Surgeon: GMichael Boston MD;  Location: [facility];  Service: General;  Laterality: N/A;  . LAPAROSCOPIC OVARIAN CYSTECTOMY Left 01/07/2015   Procedure: Attempted LAPAROSCOPIC OVARIAN CYSTECTOMY, Mini laparotomy with excision of left ovarian cyst;  Surgeon: PMora Bellman MD;  [facility];  Service: Gynecology;  Laterality: Left;  Requested 01-26-15 @ 3:30p  . WISDOM TOOTH EXTRACTION      Family History  Problem Relation Age of Onset  .  Healthy Mother   . Healthy Father   . Hypertension Paternal Grandmother   . Diabetes Paternal Grandfather   . Other Neg Hx     Social History   Tobacco Use  . Smoking status: Never Smoker  . Smokeless tobacco: Never Used  Substance Use Topics  . Alcohol use: Not Currently    Comment: not while rpeg  . Drug use: No    Allergies:  Allergies  Allergen Reactions  . Vicodin [Hydrocodone-Acetaminophen] Nausea And Vomiting and Other (See Comments)    Pt states does not cause swelling    Medications Prior to Admission  Medication Sig Dispense Refill Last Dose  . IRON PO Take by mouth.   03/17/2018 at Unknown time  . Prenat w/o A Vit-FeFum-FePo-FA (CONCEPT OB) 130-92.4-1 MG CAPS Take 1 capsule by mouth daily. 30 capsule 11 03/17/2018 at Unknown time  . valACYclovir (VALTREX) 1000 MG tablet Take 1,000 mg by mouth 2 (two) times daily.   03/17/2018 at Unknown time    Review of Systems  Constitutional: Negative.  Negative for fatigue and fever.  HENT: Negative.   Respiratory: Negative.  Negative for shortness of breath.   Cardiovascular: Negative.  Negative for chest  pain.  Gastrointestinal: Negative.  Negative for abdominal pain, constipation, diarrhea, nausea and vomiting.  Genitourinary: Positive for vaginal discharge. Negative for dysuria and vaginal bleeding.  Neurological: Negative.  Negative for dizziness and headaches.   Physical Exam   Blood pressure (!) 157/95, pulse 95, temperature 98.5 F (36.9 C), temperature source Oral, resp. rate 18, height 5' 9"  (1.753 m), weight 112.9 kg, last menstrual period 06/07/2017, SpO2 99 %, unknown if currently breastfeeding.  Patient Vitals for the past 24 hrs:  BP Temp Temp src Pulse Resp SpO2 Height Weight  03/18/18 0203 (!) 141/94 - - 87 - - - -  03/18/18 0145 137/89 - - - - - - -  03/18/18 0123 (!) 157/95 - - 95 - - - -  03/18/18 0105 (!) 157/80 - - 92 - - - -  03/18/18 0048 (!) 149/96 98.5 F (36.9 C) Oral 98 18 99 % 5' 9"   (1.753 m) 112.9 kg    Physical Exam  Nursing note and vitals reviewed. Constitutional: She is oriented to person, place, and time. She appears well-developed and well-nourished. No distress.  HENT:  Head: Normocephalic.  Eyes: Pupils are equal, round, and reactive to light.  Cardiovascular: Normal rate, regular rhythm and normal heart sounds.  Respiratory: Effort normal and breath sounds normal. No respiratory distress.  GI: Soft. Bowel sounds are normal. She exhibits no distension. There is no tenderness.  Neurological: She is alert and oriented to person, place, and time. She has normal reflexes. She displays normal reflexes. She exhibits normal muscle tone. Coordination normal.  Skin: Skin is warm and dry.  Psychiatric: She has a normal mood and affect. Her behavior is normal. Judgment and thought content normal.   Fetal Tracing:  Baseline: 130 Variability: moderate Accels: 15x15 Decels: none  Toco: none  MAU Course  Procedures Results for orders placed or performed during the hospital encounter of 03/18/18 (from the past 24 hour(s))  Protein / creatinine ratio, urine     Status: None   Collection Time: 03/18/18 12:54 AM  Result Value Ref Range   Creatinine, Urine 83.00 mg/dL   Total Protein, Urine 8 mg/dL   Protein Creatinine Ratio 0.10 0.00 - 0.15 mg/mg[Cre]  CBC     Status: Abnormal   Collection Time: 03/18/18  1:13 AM  Result Value Ref Range   WBC 5.2 4.0 - 10.5 K/uL   RBC 3.46 (L) 3.87 - 5.11 MIL/uL   Hemoglobin 10.9 (L) 12.0 - 15.0 g/dL   HCT 31.9 (L) 36.0 - 46.0 %   MCV 92.2 80.0 - 100.0 fL   MCH 31.5 26.0 - 34.0 pg   MCHC 34.2 30.0 - 36.0 g/dL   RDW 13.4 11.5 - 15.5 %   Platelets 173 150 - 400 K/uL  Comprehensive metabolic panel     Status: Abnormal   Collection Time: 03/18/18  1:13 AM  Result Value Ref Range   Sodium 135 135 - 145 mmol/L   Potassium 4.0 3.5 - 5.1 mmol/L   Chloride 104 98 - 111 mmol/L   CO2 22 22 - 32 mmol/L   Glucose, Bld 127 (H) 70 -  99 mg/dL   BUN 13 6 - 20 mg/dL   Creatinine, Ser 0.68 0.44 - 1.00 mg/dL   Calcium 9.3 8.9 - 10.3 mg/dL   Total Protein 6.0 (L) 6.5 - 8.1 g/dL   Albumin 3.0 (L) 3.5 - 5.0 g/dL   AST 16 15 - 41 U/L   ALT 17 0 - 44 U/L  Alkaline Phosphatase 105 38 - 126 U/L   Total Bilirubin 0.3 0.3 - 1.2 mg/dL   GFR calc non Af Amer >60 >60 mL/min   GFR calc Af Amer >60 >60 mL/min   Anion gap 9 5 - 15   MDM Prenatal records from private office reviewed. Pregnancy complicated by cerclage placement due to history of second trimester loss and HSV on supression.  Labs ordered and reviewed.  CBC, CMP, Protein/creat ratio Fern- negative Amnisure  N. Prothero CNM notified of patient arrival to MAU and new onset hypertension- will admit to birthing suites for IOL   Assessment and Plan   1. Hypertension affecting pregnancy in third trimester   2. PUPP (pruritic urticarial papules and plaques of pregnancy)   3. [redacted] weeks gestation of pregnancy    -Admit to birthing suites -[facility] midwife to take over care of patient.   Wende Mott CNM 03/18/2018, 1:46 AM

## 2018-03-18 NOTE — Progress Notes (Signed)
Carla Henderson is a 27 y.o. G3P0020 at 35w4ddmitted for induction of labor due to Hypertension.  Subjective: Patient reporting mild unilateral headache, RN to give second dose of tylenol. Patient denies epigastric pain or vision changes. If headache doesn't improve, consider repeating [facility]. She feels a lot of pressure with contractions but this dissipates between contractions. MVUs did drop below 200 so patient was restarted on 1 mu/min of pitocin and will be held there unless MVUs continue to drop.   Objective: Vitals:   03/18/18 1630 03/18/18 1701 03/18/18 1735 03/18/18 1800  BP: (!) 142/80 (!) 146/88  135/74  Pulse: (!) 104 100  (!) 101  Resp:  18    Temp: (!) 100.4 F (38 C)  100.3 F (37.9 C)   TempSrc: Axillary  Axillary   SpO2:      Weight:      Height:        FHT:  FHR: 160 bpm, variability: moderate,  accelerations:  Present,  decelerations:  Present early decels UC:   irregular, every 1-3 minutes SVE:   Dilation: 7 Effacement (%): 90 Station: 0 Exam by:: ACaryl PinaD., CNM  Labs: Lab Results  Component Value Date   WBC 5.2 03/18/2018   HGB 10.9 (L) 03/18/2018   HCT 31.9 (L) 03/18/2018   MCV 92.2 03/18/2018   PLT 173 03/18/2018   Assessment / Plan: Induction of labor due to gestational hypertension,  progressing well on pitocin  Labor: Progressing normally Preeclampsia:  no signs or symptoms of toxicity, intake and ouput balanced and labs stable Fetal Wellbeing:  Category I  Pain Control:  Epidural I/D:  n/a Anticipated MOD:  NSVD  EMarikay Alar11/25/2019, 6:26 PM

## 2018-03-18 NOTE — Anesthesia Preprocedure Evaluation (Signed)
Anesthesia Evaluation  Patient identified by MRN, date of birth, ID band Patient awake    Reviewed: Allergy & Precautions, Patient's Chart, lab work & pertinent test results  Airway Mallampati: II  TM Distance: >3 FB Neck ROM: Full    Dental no notable dental hx. (+) Teeth Intact   Pulmonary neg pulmonary ROS,    Pulmonary exam normal breath sounds clear to auscultation       Cardiovascular Normal cardiovascular exam Rhythm:Regular Rate:Normal     Neuro/Psych negative neurological ROS  negative psych ROS   GI/Hepatic (+) Hepatitis -  Endo/Other  PCOS Obesity  Renal/GU negative Renal ROS  negative genitourinary   Musculoskeletal negative musculoskeletal ROS (+)   Abdominal (+) + obese,   Peds  Hematology  (+) anemia ,   Anesthesia Other Findings   Reproductive/Obstetrics (+) Pregnancy HSV                             Anesthesia Physical Anesthesia Plan  ASA: II  Anesthesia Plan: Epidural   Post-op Pain Management:    Induction:   PONV Risk Score and Plan:   Airway Management Planned: Natural Airway  Additional Equipment:   Intra-op Plan:   Post-operative Plan:   Informed Consent: I have reviewed the patients History and Physical, chart, labs and discussed the procedure including the risks, benefits and alternatives for the proposed anesthesia with the patient or authorized representative who has indicated his/her understanding and acceptance.   Dental advisory given  Plan Discussed with: Anesthesiologist  Anesthesia Plan Comments:         Anesthesia Quick Evaluation

## 2018-03-18 NOTE — Progress Notes (Addendum)
Carla Henderson is a 27 y.o. G3P0020 at 25w4ddmitted for induction of labor due to Hypertension.  Subjective: Patient reported feeling like she is coming down with a cold. She had two elevated temperatures 368m apart. Consulted Dr. DiCharlesetta Garibaldind we have started her on Unasyn for maternal fever in labor. She is reporting sinus pressure and appears fatigued. Her symptoms are more consistent with a viral upper respiratory infection but she will be monitored for signs and symptoms of chorio as well.   Objective: Vitals:   03/18/18 1330 03/18/18 1350 03/18/18 1400 03/18/18 1436  BP: (!) 141/67  (!) 148/81 (!) 153/86  Pulse: (!) 106  (!) 107 (!) 116  Resp:    18  Temp: 99.4 F (37.4 C) (!) 100.4 F (38 C)    TempSrc: Oral Axillary    SpO2:      Weight:      Height:        FHT:  FHR: 150 bpm, variability: moderate,  accelerations:  Present,  decelerations:  Present occasional mild variable UC:   irregular, every 1-3 minutes SVE:   Dilation: 7 Effacement (%): 90 Station: 0 Exam by:: ElLissa MerlinCNM  Labs: Lab Results  Component Value Date   WBC 5.2 03/18/2018   HGB 10.9 (L) 03/18/2018   HCT 31.9 (L) 03/18/2018   MCV 92.2 03/18/2018   PLT 173 03/18/2018   Assessment / Plan: Induction of labor due to gestational hypertension,  progressing well on pitocin  Labor: Progressing normally Preeclampsia:  no signs or symptoms of toxicity, intake and ouput balanced and labs stable Fetal Wellbeing:  Category II but overall reassuring  Pain Control:  Epidural I/D:  n/a Anticipated MOD:  NSVD  ElMarikay Alar1/25/2019, 2:44 PM   MVUs are consistently elevated in the 270-300 MVU range. Baby's FHR is reassuring but is having variables with each contraction now. Patient is not tachysystolic. Requested RN halve the pitocin from 29m52min to 3 mu/min. Will continue to monitor.   EllMarikay Alar/25/19 3:02 PM  MVUs appeared to decrease but then returned to the 300 MVU range. Pitocin turned off at  this time. Baby has good variability, many accelerations noted with movement, but has also continued to have repetitive variables that are moderately deep. Will consider restarting pitocin as needed to ensure adequate MVUs. Baseline appears elevated in the 160s. In combination with the maternal fever we can suspect triple I. Unasyn will be continued per protocol.   EllMarikay Alar/25/19 4:02 PM

## 2018-03-18 NOTE — Anesthesia Procedure Notes (Signed)
Epidural Patient location during procedure: OB Start time: 03/18/2018 7:09 AM End time: 03/18/2018 7:17 AM  Staffing Anesthesiologist: Josephine Igo, MD Performed: anesthesiologist   Preanesthetic Checklist Completed: patient identified, site marked, surgical consent, pre-op evaluation, timeout performed, IV checked, risks and benefits discussed and monitors and equipment checked  Epidural Patient position: sitting Prep: site prepped and draped and DuraPrep Patient monitoring: continuous pulse ox and blood pressure Approach: midline Location: L3-L4 Injection technique: LOR air  Needle:  Needle type: Tuohy  Needle gauge: 17 G Needle length: 9 cm and 9 Needle insertion depth: 7 cm Catheter type: closed end flexible Catheter size: 19 Gauge Catheter at skin depth: 12 cm Test dose: negative and Other  Assessment Events: blood not aspirated, injection not painful, no injection resistance, negative IV test and no paresthesia  Additional Notes Patient identified. Risks and benefits discussed including failed block, incomplete  Pain control, post dural puncture headache, nerve damage, paralysis, blood pressure Changes, nausea, vomiting, reactions to medications-both toxic and allergic and post Partum back pain. All questions were answered. Patient expressed understanding and wished to proceed. Sterile technique was used throughout procedure. Epidural site was Dressed with sterile barrier dressing. No paresthesias, signs of intravascular injection Or signs of intrathecal spread were encountered.  Patient was more comfortable after the epidural was dosed. Please see RN's note for documentation of vital signs and FHR which are stable. Reason for block:procedure for pain

## 2018-03-19 ENCOUNTER — Encounter (HOSPITAL_COMMUNITY): Payer: Self-pay | Admitting: *Deleted

## 2018-03-19 LAB — CBC
HEMATOCRIT: 27.3 % — AB (ref 36.0–46.0)
Hemoglobin: 9.3 g/dL — ABNORMAL LOW (ref 12.0–15.0)
MCH: 31.5 pg (ref 26.0–34.0)
MCHC: 34.1 g/dL (ref 30.0–36.0)
MCV: 92.5 fL (ref 80.0–100.0)
NRBC: 0 % (ref 0.0–0.2)
PLATELETS: 169 10*3/uL (ref 150–400)
RBC: 2.95 MIL/uL — ABNORMAL LOW (ref 3.87–5.11)
RDW: 13.9 % (ref 11.5–15.5)
WBC: 13 10*3/uL — ABNORMAL HIGH (ref 4.0–10.5)

## 2018-03-19 MED ORDER — SENNOSIDES-DOCUSATE SODIUM 8.6-50 MG PO TABS
2.0000 | ORAL_TABLET | ORAL | Status: DC
  Administered 2018-03-20 (×2): 2 via ORAL
  Filled 2018-03-19 (×2): qty 2

## 2018-03-19 MED ORDER — FERROUS SULFATE 325 (65 FE) MG PO TABS
325.0000 mg | ORAL_TABLET | Freq: Every day | ORAL | Status: DC
  Administered 2018-03-20 – 2018-03-21 (×2): 325 mg via ORAL
  Filled 2018-03-19 (×2): qty 1

## 2018-03-19 MED ORDER — SIMETHICONE 80 MG PO CHEW: 80.0000 mg | CHEWABLE_TABLET | ORAL | Status: DC | PRN

## 2018-03-19 MED ORDER — DIPHENHYDRAMINE HCL 25 MG PO CAPS: 25.0000 mg | ORAL_CAPSULE | Freq: Four times a day (QID) | ORAL | Status: DC | PRN

## 2018-03-19 MED ORDER — BENZOCAINE-MENTHOL 20-0.5 % EX AERO
1.0000 "application " | INHALATION_SPRAY | CUTANEOUS | Status: DC | PRN
  Filled 2018-03-19: qty 56

## 2018-03-19 MED ORDER — WITCH HAZEL-GLYCERIN EX PADS: 1.0000 "application " | MEDICATED_PAD | CUTANEOUS | Status: DC | PRN

## 2018-03-19 MED ORDER — COCONUT OIL OIL
1.0000 "application " | TOPICAL_OIL | Status: DC | PRN
  Filled 2018-03-19: qty 120

## 2018-03-19 MED ORDER — IBUPROFEN 600 MG PO TABS
600.0000 mg | ORAL_TABLET | Freq: Four times a day (QID) | ORAL | Status: DC
  Administered 2018-03-19 – 2018-03-21 (×10): 600 mg via ORAL
  Filled 2018-03-19 (×10): qty 1

## 2018-03-19 MED ORDER — PRENATAL MULTIVITAMIN CH
1.0000 | ORAL_TABLET | Freq: Every day | ORAL | Status: DC
  Administered 2018-03-19 – 2018-03-21 (×3): 1 via ORAL
  Filled 2018-03-19 (×3): qty 1

## 2018-03-19 MED ORDER — ZOLPIDEM TARTRATE 5 MG PO TABS: 5.0000 mg | ORAL_TABLET | Freq: Every evening | ORAL | Status: DC | PRN

## 2018-03-19 MED ORDER — ONDANSETRON HCL 4 MG PO TABS: 4.0000 mg | ORAL_TABLET | ORAL | Status: DC | PRN

## 2018-03-19 MED ORDER — ONDANSETRON HCL 4 MG/2ML IJ SOLN: 4.0000 mg | INTRAMUSCULAR | Status: DC | PRN

## 2018-03-19 MED ORDER — ACETAMINOPHEN 325 MG PO TABS: 650.0000 mg | ORAL_TABLET | ORAL | Status: DC | PRN

## 2018-03-19 MED ORDER — SODIUM CHLORIDE 0.9 % IV SOLN
3.0000 g | Freq: Four times a day (QID) | INTRAVENOUS | Status: AC
  Administered 2018-03-19: 3 g via INTRAVENOUS
  Filled 2018-03-19: qty 3

## 2018-03-19 MED ORDER — TETANUS-DIPHTH-ACELL PERTUSSIS 5-2.5-18.5 LF-MCG/0.5 IM SUSP: 0.5000 mL | Freq: Once | INTRAMUSCULAR | Status: DC

## 2018-03-19 MED ORDER — DIBUCAINE 1 % RE OINT: 1.0000 "application " | TOPICAL_OINTMENT | RECTAL | Status: DC | PRN

## 2018-03-19 NOTE — Addendum Note (Signed)
Addendum  created 03/19/18 9458 by Raenette Rover, CRNA   Charge Capture section accepted, Sign clinical note

## 2018-03-19 NOTE — Anesthesia Postprocedure Evaluation (Signed)
Anesthesia Post Note  Patient: Carla Henderson  Procedure(s) Performed: AN AD HOC LABOR EPIDURAL     Patient location during evaluation: Mother Baby Anesthesia Type: Epidural Level of consciousness: awake and alert Pain management: pain level controlled Vital Signs Assessment: post-procedure vital signs reviewed and stable Respiratory status: spontaneous breathing, nonlabored ventilation and respiratory function stable Cardiovascular status: stable Postop Assessment: no headache, no backache and epidural receding Anesthetic complications: no    Last Vitals:  Vitals:   03/19/18 0115 03/19/18 0130  BP: 136/77 134/81  Pulse: (!) 119 (!) 122  Resp:    Temp:    SpO2:      Last Pain:  Vitals:   03/18/18 1942  TempSrc: Oral  PainSc:    Pain Goal:                 [name]

## 2018-03-19 NOTE — Progress Notes (Signed)
Changed to large blood pressure cuff

## 2018-03-19 NOTE — Progress Notes (Signed)
Post Partum Day 1  Subjective: no complaints, up ad lib, voiding and tolerating PO. Mild asymptomatic anemia.   Objective: Vitals:   03/19/18 1400 03/19/18 1736 03/19/18 2206 03/20/18 0554  BP: 112/67 124/78 138/80 133/89  Pulse: 89 94 (!) 102 80  Resp: 18 18 18 18   Temp: 98.1 F (36.7 C) 97.9 F (36.6 C) 98.4 F (36.9 C) 97.7 F (36.5 C)  TempSrc: Oral Oral  Oral  SpO2: 98% 99%  99%  Weight:      Height:       Physical Exam:  General: alert and cooperative Lochia: appropriate Uterine Fundus: firm Incision: n/a DVT Evaluation: No evidence of DVT seen on physical exam. Negative Homan's sign. No cords or calf tenderness. No significant calf/ankle edema.  Recent Labs    03/18/18 0113 03/19/18 0555  HGB 10.9* 9.3*  HCT 31.9* 27.3*    Assessment/Plan: Discharge home and Breastfeeding  Iron PO QD for mild asymptomatic anemia    LOS: 2 days   Marikay Alar 03/20/2018, 6:42 AM

## 2018-03-19 NOTE — Progress Notes (Signed)
I was called back to the room, RN reported small trickle of blood from vaginal opening, and moderate amount of blood noted on pts pad. I viewed the pt repair, no blood seen from repair, repair appeared approximate and hemostatic at this time. Ice pad placed. Ecchymosis noted to bilateral labia's, no hematomas noted. Pt denies severe perinel pain. Pt left stable in room eating.   BP 113/60   Pulse (!) 106   Temp 99.6 F (37.6 C) (Oral)   Resp 18   Ht 5' 9"  (1.753 m)   Wt 112.9 kg   LMP 06/07/2017   SpO2 99%   BMI 36.77 kg/m   Eniyah Eastmond 3:57 AM 03/19/18

## 2018-03-19 NOTE — Lactation Note (Signed)
This note was copied from a baby's chart. Lactation Consultation Note  Patient Name: Boy Cecilee Rosner XNATF'[id number] Date: 03/19/2018 Reason for consult: Initial assessment;Term;1st time breastfeeding Breastfeeding consultation services and support information given and reviewed.  Baby is 76 hours old and going to breast with ease per mom.  Baby is currently sleeping in mom's arms.  Instructed on feeding cues and instructed to feed with any cue.  Encouraged to call for assist/concerns prn.  Maternal Data    Feeding Feeding Type: Breast Fed  LATCH Score Latch: Repeated attempts needed to sustain latch, nipple held in mouth throughout feeding, stimulation needed to elicit sucking reflex.  Audible Swallowing: Spontaneous and intermittent  Type of Nipple: Everted at rest and after stimulation  Comfort (Breast/Nipple): Soft / non-tender  Hold (Positioning): No assistance needed to correctly position infant at breast.  LATCH Score: 9  Interventions Interventions: Skin to skin;Support pillows;Adjust position  Lactation Tools Discussed/Used     Consult Status Consult Status: Follow-up Date: 03/20/18    Ave Filter 03/19/2018, 11:46 AM

## 2018-03-19 NOTE — Anesthesia Postprocedure Evaluation (Signed)
Anesthesia Post Note  Patient: Arthor Captain  Procedure(s) Performed: AN AD HOC LABOR EPIDURAL     Patient location during evaluation: Mother Baby Anesthesia Type: Epidural Level of consciousness: awake and alert, oriented, patient cooperative and awake Pain management: pain level controlled Vital Signs Assessment: post-procedure vital signs reviewed and stable Respiratory status: spontaneous breathing, nonlabored ventilation and respiratory function stable Cardiovascular status: stable Postop Assessment: no headache, no backache, patient able to bend at knees, no apparent nausea or vomiting and able to ambulate Anesthetic complications: no    Last Vitals:  Vitals:   03/19/18 0459 03/19/18 0900  BP: (!) 144/90 128/87  Pulse: (!) 105 92  Resp: 18 16  Temp: 37 C 36.7 C  SpO2: 95% 98%    Last Pain:  Vitals:   03/19/18 0904  TempSrc:   PainSc: 7    Pain Goal:                 Landon Bassford L

## 2018-03-20 LAB — RPR: RPR: NONREACTIVE

## 2018-03-20 MED ORDER — BISACODYL 10 MG RE SUPP: 10.0000 mg | Freq: Every day | RECTAL | Status: DC | PRN

## 2018-03-20 MED ORDER — OXYCODONE-ACETAMINOPHEN 5-325 MG PO TABS: 1.0000 | ORAL_TABLET | ORAL | Status: DC | PRN

## 2018-03-20 MED ORDER — IBUPROFEN 600 MG PO TABS: 600.0000 mg | ORAL_TABLET | Freq: Four times a day (QID) | ORAL | 0 refills | Status: DC | PRN

## 2018-03-20 NOTE — Lactation Note (Signed)
This note was copied from a baby's chart. Lactation Consultation Note  Patient Name: Boy Kerri Kovacik HSVEX'O Date: 03/20/2018 Reason for consult: Follow-up assessment  Mom prefers to feed infant in cradle position. She does not like football hold. I attempted to help Mom become more comfortable by modifying her cradle position into more of a cross-cradle position.   Swallows audible to the naked ear, but infant still cueing to eat. Mom said she had been feeding off & on for the last hour or more. Mom decided to give more formula via bottle.   Parents mentioned nipple shield use when she got home. I advised her not to use a nipple shield without the advice of a lactation professional. Risks of unwarranted nipple shield use briefly discussed.  Mom is on valcyclovir 1069m bid (L2).   RMatthias HughsHGeorge H. O'Brien, Jr. [facility]11/27/2019, 6:46 PM

## 2018-03-20 NOTE — Progress Notes (Addendum)
Patient ID: Carla Henderson, female   DOB: [birth year], 27 y.o.   MRN: [id number]                           Patient Name: Carla Henderson DOB: February 11, 1991 MRN: [id number]  Date of admission: 03/18/2018 Delivering MD: Noralyn Pick   Date of discharge: 03/20/2018  Admitting diagnosis: 2.2WKS WATER BROKE Intrauterine pregnancy: [redacted]w[redacted]d    Secondary diagnosis:  Active Problems:   Encounter for elective induction of labor   Encounter for induction of labor  Discharge diagnosis: Term Pregnancy Delivered                                                                                                Post partum procedures:n/a  Augmentation: Pitocin  Complications: None  Hospital course:  Induction of Labor With Vaginal Delivery   27y.o. yo [id number] admitted to the hospital 03/18/2018 for induction of labor.  Indication for induction: Postdates and Gestational hypertension.  Patient had an uncomplicated labor course as follows: Membrane Rupture Time/Date: 4:32 AM ,03/18/2018   Intrapartum Procedures: Episiotomy: None [1]                                         Lacerations:  Labial [10];2nd degree [3];Perineal [11]  Patient had delivery of a Viable infant.  Information for the patient's newborn:  EcJesseka, Drinkard0[[id number]]Delivery Method: Vag-Spont   03/19/2018  Details of delivery can be found in separate delivery note.  Patient had a routine postpartum course. Patient is discharged home 03/20/18.  Physical exam  Vitals:   03/19/18 1400 03/19/18 1736 03/19/18 2206 03/20/18 0554  BP: 112/67 124/78 138/80 133/89  Pulse: 89 94 (!) 102 80  Resp: 18 18 18 18   Temp: 98.1 F (36.7 C) 97.9 F (36.6 C) 98.4 F (36.9 C) 97.7 F (36.5 C)  TempSrc: Oral Oral  Oral  SpO2: 98% 99%  99%  Weight:      Height:       General: alert, cooperative and no distress Lochia: appropriate Uterine Fundus: firm Incision: N/A DVT Evaluation: No evidence of DVT seen  on physical exam. Negative Homan's sign. No cords or calf tenderness. No significant calf/ankle edema. Preeclampsia: patient denied symptoms of preeclampsia: headache, vision changes, epigastric pain  Labs: RecentLabs       Lab Results  Component Value Date   WBC 13.0 (H) 03/19/2018   HGB 9.3 (L) 03/19/2018   HCT 27.3 (L) 03/19/2018   MCV 92.5 03/19/2018   PLT 169 03/19/2018     CMP Latest Ref Rng & Units 03/18/2018  Glucose 70 - 99 mg/dL 127(H)  BUN 6 - 20 mg/dL 13  Creatinine 0.44 - 1.00 mg/dL 0.68  Sodium 135 - 145 mmol/L 135  Potassium 3.5 - 5.1 mmol/L 4.0  Chloride 98 - 111 mmol/L 104  CO2 22 - 32 mmol/L 22  Calcium 8.9 - 10.3 mg/dL 9.3  Total Protein 6.5 -  8.1 g/dL 6.0(L)  Total Bilirubin 0.3 - 1.2 mg/dL 0.3  Alkaline Phos 38 - 126 U/L 105  AST 15 - 41 U/L 16  ALT 0 - 44 U/L 17    Discharge instruction: per After Visit Summary and "Baby and Me Booklet". Preeclampsia precautions provided and patient verbalized she will report to office or [facility] if she has preeclampsia symptoms.   After visit meds:       Allergies as of 03/20/2018      Reactions   Vicodin [hydrocodone-acetaminophen] Nausea And Vomiting, Other (See Comments)   Pt states does not cause swelling         Medication List    TAKE these medications   CONCEPT OB 130-92.4-1 MG Caps Take 1 capsule by mouth daily.   ibuprofen 600 MG tablet Commonly known as:  ADVIL,MOTRIN Take 1 tablet (600 mg total) by mouth every 6 (six) hours as needed for mild pain or moderate pain.   IRON PO Take by mouth.   valACYclovir 1000 MG tablet Commonly known as:  VALTREX Take 1,000 mg by mouth 2 (two) times daily.       Diet: routine diet  Activity: Advance as tolerated. Pelvic rest for 6 weeks.   Outpatient follow up:6 weeks. Patient to see [facility] for circumcision and blood pressure check with office visit in 1 week. Baby Love RN to round and check patient's blood pressure at home  starting next week.  Follow up Appt:No future appointments. Follow up Visit:No follow-ups on file.  Postpartum contraception: None  Newborn Data: Live born female  Birth Weight: 8 lb 15 oz (4054 g) APGAR: 3, 8  Newborn Delivery   Time head delivered:  03/19/2018 00:51:00 Birth date/time:  03/19/2018 00:52:00 Delivery type:  Vaginal, Spontaneous     Baby Feeding: Bottle and Breast Disposition:home with mother   03/20/2018 Marikay Alar, CNM  Called by RN and pt is requesting to stay because baby is not being discharged.  D/C canceled for 03/20/18.  Plan discharge tomorrow.    Revision History                             Routing History

## 2018-03-20 NOTE — Discharge Instructions (Signed)
Preeclampsia and Eclampsia Preeclampsia is a serious condition that develops only during pregnancy. It is also called toxemia of pregnancy. This condition causes high blood pressure along with other symptoms, such as swelling and headaches. These symptoms may develop as the condition gets worse. Preeclampsia may occur at 20 weeks of pregnancy or later. Diagnosing and treating preeclampsia early is very important. If not treated early, it can cause serious problems for you and your baby. One problem it can lead to is eclampsia, which is a condition that causes muscle jerking or shaking (convulsions or seizures) in the mother. Delivering your baby is the best treatment for preeclampsia or eclampsia. Preeclampsia and eclampsia symptoms usually go away after your baby is born. What are the causes? The cause of preeclampsia is not known. What increases the risk? The following risk factors make you more likely to develop preeclampsia:  Being pregnant for the first time.  Having had preeclampsia during a past pregnancy.  Having a family history of preeclampsia.  Having high blood pressure.  Being pregnant with twins or triplets.  Being 57 or older.  Being African-American.  Having kidney disease or diabetes.  Having medical conditions such as lupus or blood diseases.  Being very overweight (obese).  What are the signs or symptoms? The earliest signs of preeclampsia are:  High blood pressure.  Increased protein in your urine. Your health care provider will check for this at every visit before you give birth (prenatal visit).  Other symptoms that may develop as the condition gets worse include:  Severe headaches.  Sudden weight gain.  Swelling of the hands, face, legs, and feet.  Nausea and vomiting.  Vision problems, such as blurred or double vision.  Numbness in the face, arms, legs, and feet.  Urinating less than usual.  Dizziness.  Slurred speech.  Abdominal pain,  especially upper abdominal pain.  Convulsions or seizures.  Symptoms generally go away after giving birth. How is this diagnosed? There are no screening tests for preeclampsia. Your health care provider will ask you about symptoms and check for signs of preeclampsia during your prenatal visits. You may also have tests that include:  Urine tests.  Blood tests.  Checking your blood pressure.  Monitoring your babys heart rate.  Ultrasound.  How is this treated? You and your health care provider will determine the treatment approach that is best for you. Treatment may include:  Having more frequent prenatal exams to check for signs of preeclampsia, if you have an increased risk for preeclampsia.  Bed rest.  Reducing how much salt (sodium) you eat.  Medicine to lower your blood pressure.  Staying in the hospital, if your condition is severe. There, treatment will focus on controlling your blood pressure and the amount of fluids in your body (fluid retention).  You may need to take medicine (magnesium sulfate) to prevent seizures. This medicine may be given as an injection or through an IV tube.  Delivering your baby early, if your condition gets worse. You may have your labor started with medicine (induced), or you may have a cesarean delivery.  Follow these instructions at home: Eating and drinking   Drink enough fluid to keep your urine clear or pale yellow.  Eat a healthy diet that is low in sodium. Do not add salt to your food. Check nutrition labels to see how much sodium a food or beverage contains.  Avoid caffeine. Lifestyle  Do not use any products that contain nicotine or tobacco, such as cigarettes  and e-cigarettes. If you need help quitting, ask your health care provider.  Do not use alcohol or drugs.  Avoid stress as much as possible. Rest and get plenty of sleep. General instructions  Take over-the-counter and prescription medicines only as told by your  health care provider.  When lying down, lie on your side. This keeps pressure off of your baby.  When sitting or lying down, raise (elevate) your feet. Try putting some pillows underneath your lower legs.  Exercise regularly. Ask your health care provider what kinds of exercise are best for you.  Keep all follow-up and prenatal visits as told by your health care provider. This is important. How is this prevented? To prevent preeclampsia or eclampsia from developing during another pregnancy:  Get proper medical care during pregnancy. Your health care provider may be able to prevent preeclampsia or diagnose and treat it early.  Your health care provider may have you take a low-dose aspirin or a calcium supplement during your next pregnancy.  You may have tests of your blood pressure and kidney function after giving birth.  Maintain a healthy weight. Ask your health care provider for help managing weight gain during pregnancy.  Work with your health care provider to manage any long-term (chronic) health conditions you have, such as diabetes or kidney problems.  Contact a health care provider if:  You gain more weight than expected.  You have headaches.  You have nausea or vomiting.  You have abdominal pain.  You feel dizzy or light-headed. Get help right away if:  You develop sudden or severe swelling anywhere in your body. This usually happens in the legs.  You gain 5 lbs (2.3 kg) or more during one week.  You have severe: ? Abdominal pain. ? Headaches. ? Dizziness. ? Vision problems. ? Confusion. ? Nausea or vomiting.  You have a seizure.  You have trouble moving any part of your body.  You develop numbness in any part of your body.  You have trouble speaking.  You have any abnormal bleeding.  You pass out. This information is not intended to replace advice given to you by your health care provider. Make sure you discuss any questions you have with your health  care provider. Document Released: 04/07/2000 Document Revised: 12/07/2015 Document Reviewed: 11/15/2015 Elsevier Interactive Patient Education  2018 [place].   Postpartum Care After Vaginal Delivery  The period of time right after you deliver your newborn is called the postpartum period. What kind of medical care will I receive?  You may continue to receive fluids and medicines through an IV tube inserted into one of your veins.  If an incision was made near your vagina (episiotomy) or if you had some vaginal tearing during delivery, cold compresses may be placed on your episiotomy or your tear. This helps to reduce pain and swelling.  You may be given a squirt bottle to use when you go to the bathroom. You may use this until you are comfortable wiping as usual. To use the squirt bottle, follow these steps: ? Before you urinate, fill the squirt bottle with warm water. Do not use hot water. ? After you urinate, while you are sitting on the toilet, use the squirt bottle to rinse the area around your urethra and vaginal opening. This rinses away any urine and blood. ? You may do this instead of wiping. As you start healing, you may use the squirt bottle before wiping yourself. Make sure to wipe gently. ? Fill  the squirt bottle with clean water every time you use the bathroom.  You will be given sanitary pads to wear. How can I expect to feel?  You may not feel the need to urinate for several hours after delivery.  You will have some soreness and pain in your abdomen and vagina.  If you are breastfeeding, you may have uterine contractions every time you breastfeed for up to several weeks postpartum. Uterine contractions help your uterus return to its normal size.  It is normal to have vaginal bleeding (lochia) after delivery. The amount and appearance of lochia is often similar to a menstrual period in the first week after delivery. It will gradually decrease over the next few weeks to  a dry, yellow-brown discharge. For most women, lochia stops completely by 6-8 weeks after delivery. Vaginal bleeding can vary from woman to woman.  Within the first few days after delivery, you may have breast engorgement. This is when your breasts feel heavy, full, and uncomfortable. Your breasts may also throb and feel hard, tightly stretched, warm, and tender. After this occurs, you may have milk leaking from your breasts. Your health care provider can help you relieve discomfort due to breast engorgement. Breast engorgement should go away within a few days.  You may feel more sad or worried than normal due to hormonal changes after delivery. These feelings should not last more than a few days. If these feelings do not go away after several days, speak with your health care provider. How should I care for myself?  Tell your health care provider if you have pain or discomfort.  Drink enough water to keep your urine clear or pale yellow.  Wash your hands thoroughly with soap and water for at least 20 seconds after changing your sanitary pads, after using the toilet, and before holding or feeding your baby.  If you are not breastfeeding, avoid touching your breasts a lot. Doing this can make your breasts produce more milk.  If you become weak or lightheaded, or you feel like you might faint, ask for help before: ? Getting out of bed. ? Showering.  Change your sanitary pads frequently. Watch for any changes in your flow, such as a sudden increase in volume, a change in color, the passing of large blood clots. If you pass a blood clot from your vagina, save it to show to your health care provider. Do not flush blood clots down the toilet without having your health care provider look at them.  Make sure that all your vaccinations are up to date. This can help protect you and your baby from getting certain diseases. You may need to have immunizations done before you leave the hospital.  If  desired, talk with your health care provider about methods of family planning or birth control (contraception). How can I start bonding with my baby? Spending as much time as possible with your baby is very important. During this time, you and your baby can get to know each other and develop a bond. Having your baby stay with you in your room (rooming in) can give you time to get to know your baby. Rooming in can also help you become comfortable caring for your baby. Breastfeeding can also help you bond with your baby. How can I plan for returning home with my baby?  Make sure that you have a car seat installed in your vehicle. ? Your car seat should be checked by a certified car seat installer to  make sure that it is installed safely. ? Make sure that your baby fits into the car seat safely.  Ask your health care provider any questions you have about caring for yourself or your baby. Make sure that you are able to contact your health care provider with any questions after leaving the hospital. This information is not intended to replace advice given to you by your health care provider. Make sure you discuss any questions you have with your health care provider. Document Released: 02/05/2007 Document Revised: 09/13/2015 Document Reviewed: 03/15/2015 Elsevier Interactive Patient Education  2018 [employer].   Postpartum Depression and Baby Blues The postpartum period begins right after the birth of a baby. During this time, there is often a great amount of joy and excitement. It is also a time of many changes in the life of the parents. Regardless of how many times a mother gives birth, each child brings new challenges and dynamics to the family. It is not unusual to have feelings of excitement along with confusing shifts in moods, emotions, and thoughts. All mothers are at risk of developing postpartum depression or the "baby blues." These mood changes can occur right after giving birth, or they  may occur many months after giving birth. The baby blues or postpartum depression can be mild or severe. Additionally, postpartum depression can go away rather quickly, or it can be a long-term condition. What are the causes? Raised hormone levels and the rapid drop in those levels are thought to be a main cause of postpartum depression and the baby blues. A number of hormones change during and after pregnancy. Estrogen and progesterone usually decrease right after the delivery of your baby. The levels of thyroid hormone and various cortisol steroids also rapidly drop. Other factors that play a role in these mood changes include major life events and genetics. What increases the risk? If you have any of the following risks for the baby blues or postpartum depression, know what symptoms to watch out for during the postpartum period. Risk factors that may increase the likelihood of getting the baby blues or postpartum depression include:  Having a personal or family history of depression.  Having depression while being pregnant.  Having premenstrual mood issues or mood issues related to oral contraceptives.  Having a lot of life stress.  Having marital conflict.  Lacking a social support network.  Having a baby with special needs.  Having health problems, such as diabetes.  What are the signs or symptoms? Symptoms of baby blues include:  Brief changes in mood, such as going from extreme happiness to sadness.  Decreased concentration.  Difficulty sleeping.  Crying spells, tearfulness.  Irritability.  Anxiety.  Symptoms of postpartum depression typically begin within the first month after giving birth. These symptoms include:  Difficulty sleeping or excessive sleepiness.  Marked weight loss.  Agitation.  Feelings of worthlessness.  Lack of interest in activity or food.  Postpartum psychosis is a very serious condition and can be dangerous. Fortunately, it is rare.  Displaying any of the following symptoms is cause for immediate medical attention. Symptoms of postpartum psychosis include:  Hallucinations and delusions.  Bizarre or disorganized behavior.  Confusion or disorientation.  How is this diagnosed? A diagnosis is made by an evaluation of your symptoms. There are no medical or lab tests that lead to a diagnosis, but there are various questionnaires that a health care provider may use to identify those with the baby blues, postpartum depression, or psychosis. Often,  a screening tool called the [place] Postnatal Depression Scale is used to diagnose depression in the postpartum period. How is this treated? The baby blues usually goes away on its own in 1-2 weeks. Social support is often all that is needed. You will be encouraged to get adequate sleep and rest. Occasionally, you may be given medicines to help you sleep. Postpartum depression requires treatment because it can last several months or longer if it is not treated. Treatment may include individual or group therapy, medicine, or both to address any social, physiological, and psychological factors that may play a role in the depression. Regular exercise, a healthy diet, rest, and social support may also be strongly recommended. Postpartum psychosis is more serious and needs treatment right away. Hospitalization is often needed. Follow these instructions at home:  Get as much rest as you can. Nap when the baby sleeps.  Exercise regularly. Some women find yoga and walking to be beneficial.  Eat a balanced and nourishing diet.  Do little things that you enjoy. Have a cup of tea, take a bubble bath, read your favorite magazine, or listen to your favorite music.  Avoid alcohol.  Ask for help with household chores, cooking, grocery shopping, or running errands as needed. Do not try to do everything.  Talk to people close to you about how you are feeling. Get support from your partner, family  members, friends, or other new moms.  Try to stay positive in how you think. Think about the things you are grateful for.  Do not spend a lot of time alone.  Only take over-the-counter or prescription medicine as directed by your health care provider.  Keep all your postpartum appointments.  Let your health care provider know if you have any concerns. Contact a health care provider if: You are having a reaction to or problems with your medicine. Get help right away if:  You have suicidal feelings.  You think you may harm the baby or someone else. This information is not intended to replace advice given to you by your health care provider. Make sure you discuss any questions you have with your health care provider. Document Released: 01/13/2004 Document Revised: 09/16/2015 Document Reviewed: 01/20/2013 Elsevier Interactive Patient Education  2017 [employer].

## 2018-03-20 NOTE — Discharge Summary (Addendum)
OB Discharge Summary     Patient Name: Carla Henderson DOB: [birth year] MRN: [id number]  Date of admission: 03/18/2018 Delivering MD: Noralyn Pick   Date of discharge: 03/20/2018  Admitting diagnosis: 45.2WKS WATER BROKE Intrauterine pregnancy: [redacted]w[redacted]d    Secondary diagnosis:  Active Problems:   Encounter for elective induction of labor   Encounter for induction of labor  Discharge diagnosis: Term Pregnancy Delivered                                                                                                Post partum procedures:n/a  Augmentation: Pitocin  Complications: None  Hospital course:  Induction of Labor With Vaginal Delivery   27y.o. yo [id number] 467w5das admitted to the hospital 03/18/2018 for induction of labor.  Indication for induction: Postdates and Gestational hypertension.  Patient had an uncomplicated labor course as follows: Membrane Rupture Time/Date: 4:32 AM ,03/18/2018   Intrapartum Procedures: Episiotomy: None [1]                                         Lacerations:  Labial [10];2nd degree [3];Perineal [11]  Patient had delivery of a Viable infant.  Information for the patient's newborn:  EcMariea, Mcmartin0[[id number]]Delivery Method: Vag-Spont   03/19/2018  Details of delivery can be found in separate delivery note.  Patient had a routine postpartum course. Patient is discharged home 03/20/18.  Physical exam  Vitals:   03/19/18 1400 03/19/18 1736 03/19/18 2206 03/20/18 0554  BP: 112/67 124/78 138/80 133/89  Pulse: 89 94 (!) 102 80  Resp: 18 18 18 18   Temp: 98.1 F (36.7 C) 97.9 F (36.6 C) 98.4 F (36.9 C) 97.7 F (36.5 C)  TempSrc: Oral Oral  Oral  SpO2: 98% 99%  99%  Weight:      Height:       General: alert, cooperative and no distress Lochia: appropriate Uterine Fundus: firm Incision: N/A DVT Evaluation: No evidence of DVT seen on physical exam. Negative Homan's sign. No cords or calf tenderness. No significant  calf/ankle edema. Preeclampsia: patient denied symptoms of preeclampsia: headache, vision changes, epigastric pain  Labs: Lab Results  Component Value Date   WBC 13.0 (H) 03/19/2018   HGB 9.3 (L) 03/19/2018   HCT 27.3 (L) 03/19/2018   MCV 92.5 03/19/2018   PLT 169 03/19/2018   CMP Latest Ref Rng & Units 03/18/2018  Glucose 70 - 99 mg/dL 127(H)  BUN 6 - 20 mg/dL 13  Creatinine 0.44 - 1.00 mg/dL 0.68  Sodium 135 - 145 mmol/L 135  Potassium 3.5 - 5.1 mmol/L 4.0  Chloride 98 - 111 mmol/L 104  CO2 22 - 32 mmol/L 22  Calcium 8.9 - 10.3 mg/dL 9.3  Total Protein 6.5 - 8.1 g/dL 6.0(L)  Total Bilirubin 0.3 - 1.2 mg/dL 0.3  Alkaline Phos 38 - 126 U/L 105  AST 15 - 41 U/L 16  ALT 0 - 44 U/L 17    Discharge instruction: per After  Visit Summary and "Baby and Me Booklet". Preeclampsia precautions provided and patient verbalized she will report to office or MAU if she has preeclampsia symptoms.   After visit meds:  Allergies as of 03/20/2018      Reactions   Vicodin [hydrocodone-acetaminophen] Nausea And Vomiting, Other (See Comments)   Pt states does not cause swelling      Medication List    TAKE these medications   CONCEPT OB 130-92.4-1 MG Caps Take 1 capsule by mouth daily.   ibuprofen 600 MG tablet Commonly known as:  ADVIL,MOTRIN Take 1 tablet (600 mg total) by mouth every 6 (six) hours as needed for mild pain or moderate pain.   IRON PO Take by mouth.   valACYclovir 1000 MG tablet Commonly known as:  VALTREX Take 1,000 mg by mouth 2 (two) times daily.       Diet: routine diet  Activity: Advance as tolerated. Pelvic rest for 6 weeks.   Outpatient follow up:6 weeks. Patient to see [facility] for circumcision and blood pressure check with office visit in 1 week. Baby Love RN to round and check patient's blood pressure at home starting next week.  Follow up Appt:No future appointments. Follow up Visit:No follow-ups on file.  Postpartum contraception: None  Newborn  Data: Live born female  Birth Weight: 8 lb 15 oz (4054 g) APGAR: 3, 8  Newborn Delivery   Time head delivered:  03/19/2018 00:51:00 Birth date/time:  03/19/2018 00:52:00 Delivery type:  Vaginal, Spontaneous     Baby Feeding: Bottle and Breast Disposition:home with mother   03/20/2018 Marikay Alar, CNM  Called by RN and pt is requesting to stay because baby is not being discharged.  D/C canceled.

## 2018-03-21 NOTE — Discharge Summary (Signed)
Patient ID: Carla Henderson, female   DOB: 04-19-1991, 27 y.o.   MRN: [id number]                           Patient Name: Carla Henderson DOB: [birth year] MRN: [id number]  Date of admission: 03/18/2018 Delivering MD: Noralyn Pick   Date of discharge: 03/20/2018  Admitting diagnosis: 20.2WKS WATER BROKE Intrauterine pregnancy: [redacted]w[redacted]d    Secondary diagnosis:  Active Problems:   Encounter for elective induction of labor   Encounter for induction of labor  Discharge diagnosis: Term Pregnancy Delivered                                                                                                Post partum procedures:n/a  Augmentation: Pitocin  Complications: None  Hospital course:  Induction of Labor With Vaginal Delivery   27y.o. yo [id number] 475w5das admitted to the hospital 03/18/2018 for induction of labor.  Indication for induction: Postdates and Gestational hypertension.  Patient had an uncomplicated labor course as follows: Membrane Rupture Time/Date: 4:32 AM ,03/18/2018   Intrapartum Procedures: Episiotomy: None [1]                                         Lacerations:  Labial [10];2nd degree [3];Perineal [11]  Patient had delivery of a Viable infant.  Information for the patient's newborn:  Carla Henderson, Carla Henderson[[id number]]Delivery Method: Vag-Spont   03/19/2018  Details of delivery can be found in separate delivery note.  Patient had a routine postpartum course. Patient is discharged home 03/20/18.  Physical exam        Vitals:   03/19/18 1400 03/19/18 1736 03/19/18 2206 03/20/18 0554  BP: 112/67 124/78 138/80 133/89  Pulse: 89 94 (!) 102 80  Resp: 18 18 18 18   Temp: 98.1 F (36.7 C) 97.9 F (36.6 C) 98.4 F (36.9 C) 97.7 F (36.5 C)  TempSrc: Oral Oral  Oral  SpO2: 98% 99%  99%  Weight:      Height:       General: alert, cooperative and no distress Lochia: appropriate Uterine Fundus: firm Incision: N/A DVT Evaluation: No evidence of  DVT seen on physical exam. Negative Homan's sign. No cords or calf tenderness. No significant calf/ankle edema. Preeclampsia: patient denied symptoms of preeclampsia: headache, vision changes, epigastric pain  Labs: RecentLabs       Lab Results  Component Value Date   WBC 13.0 (H) 03/19/2018   HGB 9.3 (L) 03/19/2018   HCT 27.3 (L) 03/19/2018   MCV 92.5 03/19/2018   PLT 169 03/19/2018     CMP Latest Ref Rng & Units 03/18/2018  Glucose 70 - 99 mg/dL 127(H)  BUN 6 - 20 mg/dL 13  Creatinine 0.44 - 1.00 mg/dL 0.68  Sodium 135 - 145 mmol/L 135  Potassium 3.5 - 5.1 mmol/L 4.0  Chloride 98 - 111 mmol/L 104  CO2 22 - 32 mmol/L 22  Calcium 8.9 - 10.3  mg/dL 9.3  Total Protein 6.5 - 8.1 g/dL 6.0(L)  Total Bilirubin 0.3 - 1.2 mg/dL 0.3  Alkaline Phos 38 - 126 U/L 105  AST 15 - 41 U/L 16  ALT 0 - 44 U/L 17    Discharge instruction: per After Visit Summary and "Baby and Me Booklet". Preeclampsia precautions provided and patient verbalized she will report to office or MAU if she has preeclampsia symptoms.   After visit meds:       Allergies as of 03/20/2018      Reactions   Vicodin [hydrocodone-acetaminophen] Nausea And Vomiting, Other (See Comments)   Pt states does not cause swelling         Medication List    TAKE these medications   CONCEPT OB 130-92.4-1 MG Caps Take 1 capsule by mouth daily.   ibuprofen 600 MG tablet Commonly known as:  ADVIL,MOTRIN Take 1 tablet (600 mg total) by mouth every 6 (six) hours as needed for mild pain or moderate pain.   IRON PO Take by mouth.   valACYclovir 1000 MG tablet Commonly known as:  VALTREX Take 1,000 mg by mouth 2 (two) times daily.       Diet: routine diet  Activity: Advance as tolerated. Pelvic rest for 6 weeks.   Outpatient follow up:6 weeks. Patient to see [facility] for circumcision and blood pressure check with office visit in 1 week. Baby Love RN to round and check patient's blood pressure at  home starting next week.  Follow up Appt:No future appointments. Follow up Visit:No follow-ups on file.  Pt to make an appointment for 1 week with provider [facility] for BP check. Baby RN to make home visit. Pt to check home BP and report s/sx of increased BP. Pt to take iron for anemia and report s/sx.   Postpartum contraception: None  Newborn Data: Live born female  Birth Weight: 8 lb 15 oz (4054 g) APGAR: 3, 8  Newborn Delivery   Time head delivered:  03/19/2018 00:51:00 Birth date/time:  03/19/2018 00:52:00 Delivery type:  Vaginal, Spontaneous     Baby Feeding: Bottle and Breast Disposition:home with mother   03/20/2018 Marikay Alar, CNM  Called by RN and pt is requesting to stay because baby is not being discharged.  D/C canceled for 03/20/18.  Plan discharge tomorrow.    Revision History                             Routing History

## 2018-03-21 NOTE — Lactation Note (Signed)
This note was copied from a baby's chart. Lactation Consultation Note  Patient Name: Carla Henderson HWYSH'U Date: 03/21/2018   Per Jerene Pitch, RN, there's no need for a lactation visit today. RN reports that 3rd-shift RN said that Mom had said she did not want additional visits by lactation.  Matthias Hughs [facility] 03/21/2018, 8:58 AM

## 2018-03-22 ENCOUNTER — Inpatient Hospital Stay (HOSPITAL_COMMUNITY): Admission: RE | Admit: 2018-03-22 | Payer: BLUE CROSS/BLUE SHIELD | Source: Ambulatory Visit

## 2018-04-11 NOTE — Addendum Note (Signed)
Addendum  created 04/11/18 1601 by Josephine Igo, MD   [place] recorded in [place], [place] filed, Dance movement psychotherapist edited

## 2018-04-16 ENCOUNTER — Encounter (HOSPITAL_COMMUNITY): Payer: Self-pay

## 2018-04-16 ENCOUNTER — Inpatient Hospital Stay (HOSPITAL_COMMUNITY)
Admission: AD | Admit: 2018-04-16 | Discharge: 2018-04-17 | Disposition: A | Discharge status: Home / Self Care | Payer: BLUE CROSS/BLUE SHIELD | Attending: Obstetrics and Gynecology | Admitting: Obstetrics and Gynecology

## 2018-04-16 DIAGNOSIS — D649 Anemia, unspecified: Secondary | ICD-10-CM | POA: Diagnosis present

## 2018-04-16 DIAGNOSIS — N920 Excessive and frequent menstruation with regular cycle: Secondary | ICD-10-CM | POA: Insufficient documentation

## 2018-04-16 HISTORY — DX: Other reaction to spinal and lumbar puncture: G97.1

## 2018-04-16 NOTE — MAU Note (Addendum)
Pt c/o bleeding since yesterday, described as a continuous drip, passed clot about 2300, the size of a plumb. Pt had stopped bleeding most PP bleeding prior to this.  Denies fever, pain.  Pt was IOL for gest HTN/Postdates. Was put on BP meds after delivery, unsure what it is. Hasn't taken for 2 days. BP is 137/100 in triage.

## 2018-04-17 ENCOUNTER — Inpatient Hospital Stay (HOSPITAL_COMMUNITY): Payer: BLUE CROSS/BLUE SHIELD

## 2018-04-17 DIAGNOSIS — D649 Anemia, unspecified: Secondary | ICD-10-CM | POA: Diagnosis present

## 2018-04-17 DIAGNOSIS — N920 Excessive and frequent menstruation with regular cycle: Secondary | ICD-10-CM | POA: Diagnosis present

## 2018-04-17 LAB — COMPREHENSIVE METABOLIC PANEL
ALT: 111 U/L — AB (ref 0–44)
AST: 47 U/L — ABNORMAL HIGH (ref 15–41)
Albumin: 4 g/dL (ref 3.5–5.0)
Alkaline Phosphatase: 52 U/L (ref 38–126)
Anion gap: 9 (ref 5–15)
BUN: 15 mg/dL (ref 6–20)
CO2: 25 mmol/L (ref 22–32)
CREATININE: 0.86 mg/dL (ref 0.44–1.00)
Calcium: 9.1 mg/dL (ref 8.9–10.3)
Chloride: 103 mmol/L (ref 98–111)
GFR calc non Af Amer: >60 mL/min (ref >60)
Glucose, Bld: 84 mg/dL (ref 70–99)
Potassium: 3.9 mmol/L (ref 3.5–5.1)
SODIUM: 137 mmol/L (ref 135–145)
Total Bilirubin: 0.7 mg/dL (ref 0.3–1.2)
Total Protein: 6.8 g/dL (ref 6.5–8.1)

## 2018-04-17 LAB — CBC
HCT: 36.8 % (ref 36.0–46.0)
Hemoglobin: 12.2 g/dL (ref 12.0–15.0)
MCH: 30.5 pg (ref 26.0–34.0)
MCHC: 33.2 g/dL (ref 30.0–36.0)
MCV: 92 fL (ref 80.0–100.0)
NRBC: 0 % (ref 0.0–0.2)
PLATELETS: 227 10*3/uL (ref 150–400)
RBC: 4 MIL/uL (ref 3.87–5.11)
RDW: 12.4 % (ref 11.5–15.5)
WBC: 4.5 10*3/uL (ref 4.0–10.5)

## 2018-04-17 LAB — POCT PREGNANCY, URINE: PREG TEST UR: NEGATIVE

## 2018-04-17 MED ORDER — FERROUS SULFATE 325 (65 FE) MG PO TABS: 325.0000 mg | ORAL_TABLET | Freq: Two times a day (BID) | ORAL | 0 refills | Status: DC

## 2018-04-17 NOTE — Discharge Instructions (Signed)
Please take your blood pressure medication as previously prescribed. Call [facility] for any further problems or concerns.

## 2018-04-17 NOTE — MAU Provider Note (Addendum)
History     CSN: [id number]  Arrival date and time: 04/16/18 2329   First Provider Initiated Contact with Patient 04/17/18 [phone]      Chief Complaint  Patient presents with  . Vaginal Bleeding   HPI  Ms.  Carla Henderson is a 27 y.o. year old G29P1021 female who is 4 wks PP from SVD on 11/25 presents to MAU reporting heavy VB that has been like a "continuous drip" since 04/15/18, she passed a plum sized blood clot at 2300 tonight. She reports her VB stopped ~10 days after delivery, but then started back again on 04/15/18. She was induced for gHTN/postdates. She was d/c'd home after delivery on a BP medication that she cannot remember the name of. She has not taken that medication in 2 days, because she "doesn't like taking medicine."   Past Medical History:  Diagnosis Date  . Abnormal Pap smear   . Anemia   . Bronchitis    x 1 time  . HSV (herpes simplex virus) anogenital infection   . Infertility, female   . Ovarian cyst   . PCOS (polycystic ovarian syndrome)   . Spinal headache   . UTI (lower urinary tract infection)     Past Surgical History:  Procedure Laterality Date  . CERVICAL CERCLAGE    . CERVICAL CERCLAGE N/A 09/13/2017   Procedure: CERCLAGE CERVICAL;  Surgeon: Crawford Givens, MD;  Location: [facility];  Service: Gynecology;  Laterality: N/A;  . CHOLECYSTECTOMY    . LAPAROSCOPIC CHOLECYSTECTOMY SINGLE SITE WITH INTRAOPERATIVE CHOLANGIOGRAM N/A 03/01/2017   Procedure: LAPAROSCOPIC CHOLECYSTECTOMY SINGLE SITE WITH INTRAOPERATIVE CHOLANGIOGRAM;  Surgeon: Michael Boston, MD;  Location: [facility];  Service: General;  Laterality: N/A;  . LAPAROSCOPIC OVARIAN CYSTECTOMY Left 01/07/2015   Procedure: Attempted LAPAROSCOPIC OVARIAN CYSTECTOMY, Mini laparotomy with excision of left ovarian cyst;  Surgeon: Mora Bellman, MD;  Location: [facility];  Service: Gynecology;  Laterality: Left;  Requested 01-26-15 @ 3:30p  . WISDOM TOOTH EXTRACTION      Family History  Problem Relation Age of  Onset  . Healthy Mother   . Healthy Father   . Hypertension Paternal Grandmother   . Diabetes Paternal Grandfather   . Other Neg Hx     Social History   Tobacco Use  . Smoking status: Never Smoker  . Smokeless tobacco: Never Used  Substance Use Topics  . Alcohol use: Not Currently    Comment: not while rpeg  . Drug use: No    Allergies:  Allergies  Allergen Reactions  . Vicodin [Hydrocodone-Acetaminophen] Nausea And Vomiting and Other (See Comments)    Pt states does not cause swelling    Medications Prior to Admission  Medication Sig Dispense Refill Last Dose  . ibuprofen (ADVIL,MOTRIN) 600 MG tablet Take 1 tablet (600 mg total) by mouth every 6 (six) hours as needed for mild pain or moderate pain. 30 tablet 0   . IRON PO Take by mouth.   03/17/2018 at Unknown time  . Prenat w/o A Vit-FeFum-FePo-FA (CONCEPT OB) 130-92.4-1 MG CAPS Take 1 capsule by mouth daily. 30 capsule 11 03/17/2018 at Unknown time  . valACYclovir (VALTREX) 1000 MG tablet Take 1,000 mg by mouth 2 (two) times daily.   03/17/2018 at Unknown time    Review of Systems  Constitutional: Negative.   HENT: Negative.   Eyes: Negative.   Respiratory: Negative.   Cardiovascular: Negative.   Gastrointestinal: Negative.   Endocrine: Negative.   Genitourinary: Positive for vaginal bleeding (continuous flow since 12/23,  passed a plum sized clot at 2300 on 12/24).  Musculoskeletal: Negative.   Skin: Negative.   Allergic/Immunologic: Negative.   Neurological: Negative.   Hematological: Negative.   Psychiatric/Behavioral: Negative.    Physical Exam  Triage BP: 137/100 Blood pressure (!) 137/94, pulse 80, temperature 98.9 F (37.2 C), temperature source Oral, resp. rate 16, height 5' 9"  (1.753 m), SpO2 99 %, currently breastfeeding.  Physical Exam  Nursing note and vitals reviewed. Constitutional: She is oriented to person, place, and time. She appears well-developed and well-nourished.  HENT:  Head:  Normocephalic and atraumatic.  Eyes: Pupils are equal, round, and reactive to light.  Neck: Normal range of motion.  Cardiovascular: Normal rate and regular rhythm.  Respiratory: Effort normal.  GI: Soft.  Genitourinary:    Genitourinary Comments: Uterus: non-tender, SE: cervix is smooth, pink, no lesions, small amt of dark, red blood, VE: closed/long/firm, no CMT or friability, no adnexal tenderness    Musculoskeletal: Normal range of motion.  Neurological: She is alert and oriented to person, place, and time.  Skin: Skin is warm and dry.  Psychiatric: She has a normal mood and affect. Her behavior is normal. Judgment and thought content normal.    MAU Course  Procedures  MDM UPT CBC CMP Speculum Exam Pelvis complete with TVUS  *Consult with Dr. Ilda Basset @ [phone] - notified of patient's complaints, assessments, lab & U/S results, recommended tx plan add-on CMP ("she does not have to wait for results"), offer Aygestin or Megace - ok to d/c home, F/U with [facility] as scheduled and prn  Results for orders placed or performed during the hospital encounter of 04/16/18 (from the past 24 hour(s))  Pregnancy, urine POC     Status: None   Collection Time: 04/17/18 12:04 AM  Result Value Ref Range   Preg Test, Ur NEGATIVE NEGATIVE  CBC     Status: None   Collection Time: 04/17/18 12:53 AM  Result Value Ref Range   WBC 4.5 4.0 - 10.5 K/uL   RBC 4.00 3.87 - 5.11 MIL/uL   Hemoglobin 12.2 12.0 - 15.0 g/dL   HCT 36.8 36.0 - 46.0 %   MCV 92.0 80.0 - 100.0 fL   MCH 30.5 26.0 - 34.0 pg   MCHC 33.2 30.0 - 36.0 g/dL   RDW 12.4 11.5 - 15.5 %   Platelets 227 150 - 400 K/uL   nRBC 0.0 0.0 - 0.2 %      Assessment and Plan  Menorrhagia with regular cycle  - Information provided on heavy menstruation - Advised to restart BP medication and take it as previously prescribed - Discharge patient - Keep scheduled appt with [facility] on 05/02/18 or sooner if sx's worsen - Patient verbalized an understanding of  the plan of care and agrees.     Laury Deep, MSN, CNM 04/17/2018, 12:27 AM

## 2018-04-17 NOTE — MAU Note (Signed)
UPT neg per Rolla Flatten RN.  Gilmer Mor RN

## 2018-05-19 ENCOUNTER — Encounter (HOSPITAL_COMMUNITY): Payer: Self-pay

## 2018-05-19 ENCOUNTER — Inpatient Hospital Stay (HOSPITAL_COMMUNITY)
Admission: AD | Admit: 2018-05-19 | Discharge: 2018-05-20 | Disposition: A | Discharge status: Home / Self Care | Payer: BLUE CROSS/BLUE SHIELD | Source: Ambulatory Visit | Attending: Obstetrics and Gynecology | Admitting: Obstetrics and Gynecology

## 2018-05-19 DIAGNOSIS — I10 Essential (primary) hypertension: Secondary | ICD-10-CM

## 2018-05-19 DIAGNOSIS — O1003 Pre-existing essential hypertension complicating the puerperium: Secondary | ICD-10-CM | POA: Insufficient documentation

## 2018-05-19 DIAGNOSIS — G44201 Tension-type headache, unspecified, intractable: Secondary | ICD-10-CM

## 2018-05-19 DIAGNOSIS — G44209 Tension-type headache, unspecified, not intractable: Secondary | ICD-10-CM | POA: Insufficient documentation

## 2018-05-19 LAB — URINALYSIS, ROUTINE W REFLEX MICROSCOPIC
Bilirubin Urine: NEGATIVE
Glucose, UA: NEGATIVE mg/dL
Hgb urine dipstick: NEGATIVE
Ketones, ur: NEGATIVE mg/dL
Nitrite: NEGATIVE
Protein, ur: NEGATIVE mg/dL
Specific Gravity, Urine: 1.018 (ref 1.005–1.030)
pH: 7 (ref 5.0–8.0)

## 2018-05-19 NOTE — MAU Note (Signed)
Pt had headache at home about an hour ago, has hx of hypertension. BP 155/112 at home. Retook at [facility], still elevated "170s/120s", took again at home and was still in 150s. Has headache. Denies blurry vision. Had 30 day supply of some type of BP med, but has been normal so has not renewed.

## 2018-05-20 LAB — COMPREHENSIVE METABOLIC PANEL
ALT: 26 U/L (ref 0–44)
AST: 17 U/L (ref 15–41)
Albumin: 4 g/dL (ref 3.5–5.0)
Alkaline Phosphatase: 47 U/L (ref 38–126)
Anion gap: 7 (ref 5–15)
BUN: 11 mg/dL (ref 6–20)
CO2: 27 mmol/L (ref 22–32)
Calcium: 9.5 mg/dL (ref 8.9–10.3)
Chloride: 104 mmol/L (ref 98–111)
Creatinine, Ser: 0.67 mg/dL (ref 0.44–1.00)
GFR calc Af Amer: >60 mL/min (ref >60)
GFR calc non Af Amer: >60 mL/min (ref >60)
Glucose, Bld: 114 mg/dL — ABNORMAL HIGH (ref 70–99)
Potassium: 3.8 mmol/L (ref 3.5–5.1)
Sodium: 138 mmol/L (ref 135–145)
Total Bilirubin: 0.7 mg/dL (ref 0.3–1.2)
Total Protein: 7.2 g/dL (ref 6.5–8.1)

## 2018-05-20 LAB — CBC
HCT: 35 % — ABNORMAL LOW (ref 36.0–46.0)
Hemoglobin: 11.5 g/dL — ABNORMAL LOW (ref 12.0–15.0)
MCH: 29.9 pg (ref 26.0–34.0)
MCHC: 32.9 g/dL (ref 30.0–36.0)
MCV: 91.1 fL (ref 80.0–100.0)
Platelets: 225 10*3/uL (ref 150–400)
RBC: 3.84 MIL/uL — ABNORMAL LOW (ref 3.87–5.11)
RDW: 12.4 % (ref 11.5–15.5)
WBC: 4.5 10*3/uL (ref 4.0–10.5)
nRBC: 0 % (ref 0.0–0.2)

## 2018-05-20 MED ORDER — AMLODIPINE BESYLATE 10 MG PO TABS
10.0000 mg | ORAL_TABLET | Freq: Once | ORAL | Status: AC
  Administered 2018-05-20: 10 mg via ORAL
  Filled 2018-05-20: qty 1

## 2018-05-20 MED ORDER — ACETAMINOPHEN 325 MG PO TABS
650.0000 mg | ORAL_TABLET | Freq: Once | ORAL | Status: AC
  Administered 2018-05-20: 650 mg via ORAL
  Filled 2018-05-20: qty 2

## 2018-05-20 MED ORDER — BUTALBITAL-APAP-CAFFEINE 50-325-40 MG PO TABS: 1.0000 | ORAL_TABLET | Freq: Four times a day (QID) | ORAL | 0 refills | Status: AC | PRN

## 2018-05-20 MED ORDER — AMLODIPINE BESYLATE 10 MG PO TABS: 10.0000 mg | ORAL_TABLET | Freq: Every day | ORAL | 0 refills | Status: DC

## 2018-05-20 MED ORDER — AMLODIPINE BESYLATE 10 MG PO TABS
10.0000 mg | ORAL_TABLET | Freq: Every day | ORAL | Status: DC
  Filled 2018-05-20: qty 1

## 2018-05-20 NOTE — MAU Provider Note (Signed)
History     CSN: [id number]  Arrival date and time: 05/19/18 2237   First Provider Initiated Contact with Patient 05/19/18 2340      Chief Complaint  Patient presents with  . Hypertension  . Headache   Carla Henderson is a 28 y.o. G3P1 who is 8 weeks PP from a SVD on 03/19/2018. She presents to MAU with complaints of HTN and HA. Has been followed for her BP since delivery, started on Procardia and stopped taking medication 2 weeks ago after her PP appointment. Is currently being followed by her PCP [facility]) for hypertension, was seen last week for HTN and not started on any medication but completed lab work to check AST/ALT per patient. Took BP at home today which was 155/112, went to [facility] to repeat BP which was "170s/120s", retook BP at home one last time prior to coming to MAU which was in the 150s per patient. She denies being diagnosed with PEC intrapartum or PP, denies receiving magnesium during labor or postpartum. Reports having a HA that started one hour prior to arrival to MAU, rates HA 10/10- has not taken any medication for HA prior to arrival to MAU. Has history of chronic migraines.    OB History    Gravida  3   Para  1   Term  1   Preterm      AB  2   Living  1     SAB  2   TAB      Ectopic      Multiple  0   Live Births  1           Past Medical History:  Diagnosis Date  . Abnormal Pap smear   . Anemia   . Bronchitis    x 1 time  . HSV (herpes simplex virus) anogenital infection   . Infertility, female   . Ovarian cyst   . PCOS (polycystic ovarian syndrome)   . Spinal headache   . UTI (lower urinary tract infection)     Past Surgical History:  Procedure Laterality Date  . CERVICAL CERCLAGE    . CERVICAL CERCLAGE N/A 09/13/2017   Procedure: CERCLAGE CERVICAL;  Surgeon: Crawford Givens, MD;  Location: [facility];  Service: Gynecology;  Laterality: N/A;  . CHOLECYSTECTOMY    . LAPAROSCOPIC CHOLECYSTECTOMY SINGLE SITE WITH  INTRAOPERATIVE CHOLANGIOGRAM N/A 03/01/2017   Procedure: LAPAROSCOPIC CHOLECYSTECTOMY SINGLE SITE WITH INTRAOPERATIVE CHOLANGIOGRAM;  Surgeon: Michael Boston, MD;  Location: [facility];  Service: General;  Laterality: N/A;  . LAPAROSCOPIC OVARIAN CYSTECTOMY Left 01/07/2015   Procedure: Attempted LAPAROSCOPIC OVARIAN CYSTECTOMY, Mini laparotomy with excision of left ovarian cyst;  Surgeon: Mora Bellman, MD;  Location: [facility];  Service: Gynecology;  Laterality: Left;  Requested 01-26-15 @ 3:30p  . WISDOM TOOTH EXTRACTION      Family History  Problem Relation Age of Onset  . Healthy Mother   . Healthy Father   . Hypertension Paternal Grandmother   . Diabetes Paternal Grandfather   . Other Neg Hx     Social History   Tobacco Use  . Smoking status: Never Smoker  . Smokeless tobacco: Never Used  Substance Use Topics  . Alcohol use: Not Currently    Comment: not while rpeg  . Drug use: No    Allergies:  Allergies  Allergen Reactions  . Vicodin [Hydrocodone-Acetaminophen] Nausea And Vomiting and Other (See Comments)    Pt states does not cause swelling    Medications Prior to  Admission  Medication Sig Dispense Refill Last Dose  . acetaminophen (TYLENOL) 325 MG tablet Take 650 mg by mouth every 6 (six) hours as needed for moderate pain.   05/19/2018 at Unknown time  . NIFEdipine (ADALAT CC) 30 MG 24 hr tablet Take 30 mg by mouth daily.   05/19/2018 at Unknown time  . Prenat w/o A Vit-FeFum-FePo-FA (CONCEPT OB) 130-92.4-1 MG CAPS Take 1 capsule by mouth daily. 30 capsule 11 05/19/2018 at Unknown time  . ferrous sulfate 325 (65 FE) MG tablet Take 1 tablet (325 mg total) by mouth 2 (two) times daily with a meal. 30 tablet 0 not taking  . ibuprofen (ADVIL,MOTRIN) 600 MG tablet Take 1 tablet (600 mg total) by mouth every 6 (six) hours as needed for mild pain or moderate pain. 30 tablet 0 not taking  . valACYclovir (VALTREX) 1000 MG tablet Take 1,000 mg by mouth 2 (two) times daily.   not taking     Review of Systems  Constitutional:       Hypertension  Respiratory: Negative.   Cardiovascular: Negative.   Genitourinary: Negative.   Musculoskeletal: Negative.   Neurological: Positive for headaches. Negative for dizziness, weakness and light-headedness.   Physical Exam   Patient Vitals for the past 24 hrs:  BP Temp Temp src Pulse Resp SpO2 Height Weight  05/20/18 0111 (!) 148/84 - - (!) 58 16 - - -  05/20/18 0031 (!) 147/92 - - (!) 54 - - - -  05/20/18 0016 (!) 138/94 - - (!) 59 - - - -  05/20/18 0013 (!) 135/101 - - - - - - -  05/19/18 2346 (!) 150/96 - - 71 - - - -  05/19/18 2331 (!) 144/101 - - 62 - - - -  05/19/18 2316 (!) 150/110 - - 70 - - - -  05/19/18 2314 (!) 150/106 97.6 F (36.4 C) Oral 69 16 99 % - -  05/19/18 2257 - - - - - - 5' 9"  (1.753 m) 88.5 kg   Physical Exam  Nursing note and vitals reviewed. Constitutional: She is oriented to person, place, and time. She appears well-developed and well-nourished. No distress.  Cardiovascular: Normal rate, regular rhythm and normal heart sounds.  Respiratory: Effort normal and breath sounds normal. No respiratory distress. She has no wheezes.  GI: Soft. Bowel sounds are normal. She exhibits no distension. There is no abdominal tenderness. There is no rebound.  Musculoskeletal: Normal range of motion.        General: No edema.  Neurological: She is alert and oriented to person, place, and time. She displays normal reflexes. She exhibits normal muscle tone.  Psychiatric: She has a normal mood and affect. Her behavior is normal. Thought content normal.    MAU Course  Procedures  MDM Orders Placed This Encounter  Procedures  . Urine Culture  . Urinalysis, Routine w reflex microscopic  . CBC  . Comprehensive metabolic panel   Meds ordered this encounter  Medications  . acetaminophen (TYLENOL) tablet 650 mg  . amLODipine (NORVASC) tablet 10 mg  . amLODipine (NORVASC) 10 MG tablet    Sig: Take 1 tablet (10 mg  total) by mouth daily.    Dispense:  30 tablet    Refill:  0  . butalbital-acetaminophen-caffeine (FIORICET, ESGIC) 50-325-40 MG tablet    Sig: Take 1 tablet by mouth every 6 (six) hours as needed for headache.    Dispense:  8 tablet    Refill:  0  Treatments in MAU included Norvasc and Tylenol, patient drove self to hospital unable to give stronger medication, patient is currently breastfeeding and is nervous to take BP medication while breastfeeding. Educated and discussed with patient medication that is safe to take while breastfeeding including Norvasc for BP, patient verbalizes understanding.   Labs reviewed:  Results for orders placed or performed during the hospital encounter of 05/19/18 (from the past 24 hour(s))  Urinalysis, Routine w reflex microscopic     Status: Abnormal   Collection Time: 05/19/18 11:03 PM  Result Value Ref Range   Color, Urine YELLOW YELLOW   APPearance HAZY (A) CLEAR   Specific Gravity, Urine 1.018 1.005 - 1.030   pH 7.0 5.0 - 8.0   Glucose, UA NEGATIVE NEGATIVE mg/dL   Hgb urine dipstick NEGATIVE NEGATIVE   Bilirubin Urine NEGATIVE NEGATIVE   Ketones, ur NEGATIVE NEGATIVE mg/dL   Protein, ur NEGATIVE NEGATIVE mg/dL   Nitrite NEGATIVE NEGATIVE   Leukocytes, UA LARGE (A) NEGATIVE   RBC / HPF 11-20 0 - 5 RBC/hpf   WBC, UA 21-50 0 - 5 WBC/hpf   Bacteria, UA RARE (A) NONE SEEN   Squamous Epithelial / LPF 0-5 0 - 5   Mucus PRESENT   CBC     Status: Abnormal   Collection Time: 05/20/18 12:21 AM  Result Value Ref Range   WBC 4.5 4.0 - 10.5 K/uL   RBC 3.84 (L) 3.87 - 5.11 MIL/uL   Hemoglobin 11.5 (L) 12.0 - 15.0 g/dL   HCT 35.0 (L) 36.0 - 46.0 %   MCV 91.1 80.0 - 100.0 fL   MCH 29.9 26.0 - 34.0 pg   MCHC 32.9 30.0 - 36.0 g/dL   RDW 12.4 11.5 - 15.5 %   Platelets 225 150 - 400 K/uL   nRBC 0.0 0.0 - 0.2 %  Comprehensive metabolic panel     Status: Abnormal   Collection Time: 05/20/18 12:21 AM  Result Value Ref Range   Sodium 138 135 - 145 mmol/L    Potassium 3.8 3.5 - 5.1 mmol/L   Chloride 104 98 - 111 mmol/L   CO2 27 22 - 32 mmol/L   Glucose, Bld 114 (H) 70 - 99 mg/dL   BUN 11 6 - 20 mg/dL   Creatinine, Ser 0.67 0.44 - 1.00 mg/dL   Calcium 9.5 8.9 - 10.3 mg/dL   Total Protein 7.2 6.5 - 8.1 g/dL   Albumin 4.0 3.5 - 5.0 g/dL   AST 17 15 - 41 U/L   ALT 26 0 - 44 U/L   Alkaline Phosphatase 47 38 - 126 U/L   Total Bilirubin 0.7 0.3 - 1.2 mg/dL   GFR calc non Af Amer >60 >60 mL/min   GFR calc Af Amer >60 >60 mL/min   Anion gap 7 5 - 15   Patient is now 8 weeks PP, most likely CHTN. AST and ALT normalized in comparison to previous labs. HTN decreased after treatment in MAU with Norvasc, which takes 24 hours to increase to full treatment dose. Patient reports HA has decreased to 6/10 after treatment with Tylenol.  Consult with Dr Ilda Basset about assessment and management, okay to discharge home with Norvasc and patient to follow up with PCP this week for continued management.   Discussed plan with patient. Educated on importance of diet modifications and exercise to maintain normotensive in addition to medication, patient verbalizes understanding. Rx for Fioricet sent to pharmacy of choice for HA due to patient driving to hospital tonight.  Encourage to hydrate, take fioricet and go to bed since she has babysitter covered tonight. Pt stable at time of discharge, follow up this week with PCP for management of BP.   Assessment and Plan   1. Chronic hypertension   2. Acute intractable tension-type headache    Discharge home Follow up with PCP this week for management  Chart CC'd to [facility] provider  Rx for Fioricet and [place]. Schedule an appointment as soon as possible for a visit.   Why:  Follow up with [facility] this week for blood pressure follow up  Contact information: [place] [id number] [phone]          Allergies as of 05/20/2018       Reactions   Vicodin [hydrocodone-acetaminophen] Nausea And Vomiting, Other (See Comments)   Pt states does not cause swelling      Medication List    STOP taking these medications   CONCEPT OB 130-92.4-1 MG Caps   ferrous sulfate 325 (65 FE) MG tablet   NIFEdipine 30 MG 24 hr tablet Commonly known as:  ADALAT CC     TAKE these medications   acetaminophen 325 MG tablet Commonly known as:  TYLENOL Take 650 mg by mouth every 6 (six) hours as needed for moderate pain.   amLODipine 10 MG tablet Commonly known as:  NORVASC Take 1 tablet (10 mg total) by mouth daily. Start taking on:  May 21, 2018   butalbital-acetaminophen-caffeine 50-325-40 MG tablet Commonly known as:  FIORICET, ESGIC Take 1 tablet by mouth every 6 (six) hours as needed for headache.   ibuprofen 600 MG tablet Commonly known as:  ADVIL,MOTRIN Take 1 tablet (600 mg total) by mouth every 6 (six) hours as needed for mild pain or moderate pain.   valACYclovir 1000 MG tablet Commonly known as:  VALTREX Take 1,000 mg by mouth 2 (two) times daily.      Lajean Manes CNM  05/20/2018, 1:14 AM

## 2018-05-21 LAB — URINE CULTURE

## 2018-06-14 ENCOUNTER — Other Ambulatory Visit (HOSPITAL_COMMUNITY): Payer: Self-pay | Admitting: Certified Nurse Midwife

## 2019-03-13 ENCOUNTER — Other Ambulatory Visit: Payer: Self-pay

## 2019-03-13 DIAGNOSIS — Z20822 Contact with and (suspected) exposure to covid-19: Secondary | ICD-10-CM

## 2019-03-15 LAB — NOVEL CORONAVIRUS, NAA: SARS-CoV-2, NAA: NOT DETECTED

## 2020-07-27 LAB — OB RESULTS CONSOLE GC/CHLAMYDIA
Chlamydia: NEGATIVE
Gonorrhea: NEGATIVE

## 2020-07-27 LAB — OB RESULTS CONSOLE RUBELLA ANTIBODY, IGM: Rubella: IMMUNE

## 2020-07-27 LAB — OB RESULTS CONSOLE HIV ANTIBODY (ROUTINE TESTING): HIV: NONREACTIVE

## 2020-07-27 LAB — OB RESULTS CONSOLE ANTIBODY SCREEN: Antibody Screen: NEGATIVE

## 2020-07-27 LAB — OB RESULTS CONSOLE ABO/RH: RH Type: POSITIVE

## 2020-07-27 LAB — OB RESULTS CONSOLE HEPATITIS B SURFACE ANTIGEN: Hepatitis B Surface Ag: NEGATIVE

## 2020-07-27 LAB — HEPATITIS C ANTIBODY: HCV Ab: NEGATIVE

## 2020-07-27 LAB — OB RESULTS CONSOLE RPR: RPR: NONREACTIVE

## 2020-08-19 ENCOUNTER — Other Ambulatory Visit: Payer: Self-pay | Admitting: Obstetrics and Gynecology

## 2020-08-26 ENCOUNTER — Telehealth (HOSPITAL_COMMUNITY): Payer: Self-pay | Admitting: *Deleted

## 2020-08-26 ENCOUNTER — Encounter (HOSPITAL_COMMUNITY): Payer: Self-pay | Admitting: *Deleted

## 2020-08-26 NOTE — Telephone Encounter (Signed)
Preadmission screen  

## 2020-09-06 ENCOUNTER — Other Ambulatory Visit (HOSPITAL_COMMUNITY)
Admission: RE | Admit: 2020-09-06 | Discharge: 2020-09-06 | Disposition: A | Discharge status: Home / Self Care | Payer: Medicaid Other | Source: Ambulatory Visit | Attending: Obstetrics and Gynecology | Admitting: Obstetrics and Gynecology

## 2020-09-06 DIAGNOSIS — Z01812 Encounter for preprocedural laboratory examination: Secondary | ICD-10-CM | POA: Diagnosis present

## 2020-09-06 DIAGNOSIS — Z20822 Contact with and (suspected) exposure to covid-19: Secondary | ICD-10-CM | POA: Insufficient documentation

## 2020-09-07 LAB — SARS CORONAVIRUS 2 (TAT 6-24 HRS): SARS Coronavirus 2: NEGATIVE

## 2020-09-08 ENCOUNTER — Ambulatory Visit (HOSPITAL_COMMUNITY)
Admission: RE | Admit: 2020-09-08 | Discharge: 2020-09-08 | Disposition: A | Discharge status: Home / Self Care | Payer: Medicaid Other | Source: Ambulatory Visit | Attending: Obstetrics and Gynecology | Admitting: Obstetrics and Gynecology

## 2020-09-08 ENCOUNTER — Ambulatory Visit (HOSPITAL_COMMUNITY): Payer: Medicaid Other | Admitting: Anesthesiology

## 2020-09-08 ENCOUNTER — Encounter (HOSPITAL_COMMUNITY): Payer: Self-pay | Admitting: Obstetrics and Gynecology

## 2020-09-08 ENCOUNTER — Encounter (HOSPITAL_COMMUNITY)
Admission: RE | Disposition: A | Discharge status: Home / Self Care | Payer: Self-pay | Source: Ambulatory Visit | Attending: Obstetrics and Gynecology

## 2020-09-08 ENCOUNTER — Other Ambulatory Visit: Payer: Self-pay

## 2020-09-08 DIAGNOSIS — N883 Incompetence of cervix uteri: Secondary | ICD-10-CM | POA: Diagnosis present

## 2020-09-08 DIAGNOSIS — O3430 Maternal care for cervical incompetence, unspecified trimester: Secondary | ICD-10-CM | POA: Insufficient documentation

## 2020-09-08 DIAGNOSIS — Z833 Family history of diabetes mellitus: Secondary | ICD-10-CM | POA: Diagnosis not present

## 2020-09-08 DIAGNOSIS — Z8249 Family history of ischemic heart disease and other diseases of the circulatory system: Secondary | ICD-10-CM | POA: Insufficient documentation

## 2020-09-08 DIAGNOSIS — Z3A00 Weeks of gestation of pregnancy not specified: Secondary | ICD-10-CM | POA: Insufficient documentation

## 2020-09-08 HISTORY — PX: CERVICAL CERCLAGE: SHX1329

## 2020-09-08 LAB — BASIC METABOLIC PANEL
Anion gap: 8 (ref 5–15)
BUN: 6 mg/dL (ref 6–20)
CO2: 20 mmol/L — ABNORMAL LOW (ref 22–32)
Calcium: 8.9 mg/dL (ref 8.9–10.3)
Chloride: 106 mmol/L (ref 98–111)
Creatinine, Ser: 0.56 mg/dL (ref 0.44–1.00)
GFR, Estimated: >60 mL/min (ref >60)
Glucose, Bld: 97 mg/dL (ref 70–99)
Potassium: 3.8 mmol/L (ref 3.5–5.1)
Sodium: 134 mmol/L — ABNORMAL LOW (ref 135–145)

## 2020-09-08 LAB — TYPE AND SCREEN
ABO/RH(D): A POS
Antibody Screen: NEGATIVE

## 2020-09-08 LAB — CBC
HCT: 32.4 % — ABNORMAL LOW (ref 36.0–46.0)
Hemoglobin: 10.8 g/dL — ABNORMAL LOW (ref 12.0–15.0)
MCH: 30.2 pg (ref 26.0–34.0)
MCHC: 33.3 g/dL (ref 30.0–36.0)
MCV: 90.5 fL (ref 80.0–100.0)
Platelets: 200 10*3/uL (ref 150–400)
RBC: 3.58 MIL/uL — ABNORMAL LOW (ref 3.87–5.11)
RDW: 13.4 % (ref 11.5–15.5)
WBC: 6.1 10*3/uL (ref 4.0–10.5)
nRBC: 0 % (ref 0.0–0.2)

## 2020-09-08 SURGERY — CERCLAGE, CERVIX, VAGINAL APPROACH
Anesthesia: Spinal | Wound class: Clean Contaminated

## 2020-09-08 MED ORDER — POVIDONE-IODINE 10 % EX SWAB
2.0000 "application " | Freq: Once | CUTANEOUS | Status: AC
  Administered 2020-09-08: 2 via TOPICAL

## 2020-09-08 MED ORDER — ACETAMINOPHEN 10 MG/ML IV SOLN: 1000.0000 mg | Freq: Once | INTRAVENOUS | Status: DC | PRN

## 2020-09-08 MED ORDER — PROMETHAZINE HCL 25 MG/ML IJ SOLN: 6.2500 mg | INTRAMUSCULAR | Status: DC | PRN

## 2020-09-08 MED ORDER — FENTANYL CITRATE (PF) 100 MCG/2ML IJ SOLN: 25.0000 ug | INTRAMUSCULAR | Status: DC | PRN

## 2020-09-08 MED ORDER — SODIUM CHLORIDE (PF) 0.9 % IJ SOLN
INTRAMUSCULAR | Status: DC | PRN
  Administered 2020-09-08: 30 mL via VAGINAL

## 2020-09-08 MED ORDER — FENTANYL CITRATE (PF) 100 MCG/2ML IJ SOLN
INTRAMUSCULAR | Status: DC | PRN
  Administered 2020-09-08: 15 ug via INTRATHECAL

## 2020-09-08 MED ORDER — CHLOROPROCAINE HCL 50 MG/5ML IT SOLN
INTRATHECAL | Status: AC
  Filled 2020-09-08: qty 5

## 2020-09-08 MED ORDER — SODIUM CHLORIDE (PF) 0.9 % IJ SOLN
Freq: Once | INTRAMUSCULAR | Status: DC
  Filled 2020-09-08: qty 1

## 2020-09-08 MED ORDER — SODIUM CHLORIDE 0.9 % IR SOLN
Status: DC | PRN
  Administered 2020-09-08: 1000 mL

## 2020-09-08 MED ORDER — FENTANYL CITRATE (PF) 100 MCG/2ML IJ SOLN
INTRAMUSCULAR | Status: AC
  Filled 2020-09-08: qty 2

## 2020-09-08 MED ORDER — LACTATED RINGERS IV SOLN: INTRAVENOUS | Status: DC

## 2020-09-08 MED ORDER — CHLOROPROCAINE HCL 50 MG/5ML IT SOLN
INTRATHECAL | Status: DC | PRN
  Administered 2020-09-08: 4 mL via INTRATHECAL

## 2020-09-08 MED ORDER — AMISULPRIDE (ANTIEMETIC) 5 MG/2ML IV SOLN: 10.0000 mg | Freq: Once | INTRAVENOUS | Status: DC | PRN

## 2020-09-08 MED ORDER — INDOMETHACIN 25 MG PO CAPS: 25.0000 mg | ORAL_CAPSULE | Freq: Three times a day (TID) | ORAL | 0 refills | Status: AC

## 2020-09-08 MED ORDER — POVIDONE-IODINE 10 % EX SWAB: 2.0000 "application " | Freq: Once | CUTANEOUS | Status: DC

## 2020-09-08 SURGICAL SUPPLY — 16 items
CANISTER SUCT 3000ML PPV (MISCELLANEOUS) ×2 IMPLANT
GLOVE BIO SURGEON STRL SZ 6.5 (GLOVE) ×2 IMPLANT
GLOVE BIOGEL PI IND STRL 7.0 (GLOVE) ×1 IMPLANT
GLOVE BIOGEL PI INDICATOR 7.0 (GLOVE) ×1
GOWN STRL REUS W/TWL LRG LVL3 (GOWN DISPOSABLE) ×4 IMPLANT
PACK VAGINAL MINOR WOMEN LF (CUSTOM PROCEDURE TRAY) ×2 IMPLANT
PAD OB MATERNITY 4.3X12.25 (PERSONAL CARE ITEMS) ×2 IMPLANT
PAD PREP 24X48 CUFFED NSTRL (MISCELLANEOUS) ×2 IMPLANT
SCOPETTES 8  STERILE (MISCELLANEOUS)
SCOPETTES 8 STERILE (MISCELLANEOUS) IMPLANT
SUT MERSILENE 5MM BP 1 12 (SUTURE) ×2 IMPLANT
SYR BULB IRRIGATION 50ML (SYRINGE) ×2 IMPLANT
TOWEL OR 17X24 6PK STRL BLUE (TOWEL DISPOSABLE) ×4 IMPLANT
TRAY FOLEY W/BAG SLVR 14FR (SET/KITS/TRAYS/PACK) ×2 IMPLANT
TUBING NON-CON 1/4 X 20 CONN (TUBING) ×2 IMPLANT
YANKAUER SUCT BULB TIP NO VENT (SUCTIONS) ×2 IMPLANT

## 2020-09-08 NOTE — Discharge Instructions (Signed)
Cervical Cerclage, Care After This sheet gives you information about how to care for yourself after your procedure. Your health care provider may also give you more specific instructions. If you have problems or questions, contact your health care provider. What can I expect after the procedure? After your procedure, it is common to have:  Cramping in your abdomen.  Mucus discharge from your vagina. This may last for several days.  Painful urination (dysuria).  Spotting, or small drops of blood coming from your vagina. Follow these instructions at home: Medicines  Take over-the-counter and prescription medicines only as told by your health care provider.  Ask your health care provider if the medicine prescribed to you requires you to avoid driving or using heavy machinery. General instructions  If you are told to go on bed rest, follow instructions from your health care provider. You may need to be on bed rest for up to 3 days.  Keep track of your vaginal discharge and watch for any changes. If you notice changes, tell your health care provider.  Avoid physical activities and exercise until your health care provider approves. Ask your health care provider what activities are safe for you.  Do not douche or have sex until your health care provider says it is okay to do so.  Keep all follow-up visits, including prenatal visits, as told by your health care provider. This is important. ? Prenatal visits are all the care that you receive before the birth of your baby. ? You may also need an ultrasound.  You may be asked to have weekly visits to have your cervix checked.   Contact a health care provider if you:  Have abnormal discharge from your vagina, such as clots.  Have a bad-smelling discharge from your vagina.  Develop a rash on your skin. This may look like redness and swelling.  Become light-headed or feel like you are going to faint.  Have abdominal pain that does not  get better with medicine.  Have nausea or vomiting that does not go away. Get help right away if you:  Have vaginal bleeding that is heavier or more frequent than spotting.  Are leaking fluid or your water breaks.  Have a fever or chills.  Faint.  Have uterine contractions. These may feel like: ? A back ache. ? Lower abdominal pain. ? Mild cramps, similar to menstrual cramps. ? Tightening or pressure in your abdomen.  Think that your baby is not moving as much as usual, or you cannot feel your baby move.  Have chest pain.  Have shortness of breath. Summary  After the procedure, it is common to have cramping, vaginal discharge, painful urination, and small drops of blood coming from your vagina.  If you are told to go on bed rest, follow instructions from your health care provider. You may need to be on bed rest for up to 3 days.  Keep track of your vaginal discharge and watch for any changes. If you notice changes, tell your health care provider.  Contact a health care provider if you have abnormal vaginal discharge, become light-headed, or have pain that cannot be controlled with medicines.  Get help right away if you have heavy vaginal bleeding, your water breaks, or you have uterine contractions. Also, get help right away if your baby is not moving as much as usual, or you have chest pain or shortness of breath. This information is not intended to replace advice given to you by your health care  provider. Make sure you discuss any questions you have with your health care provider. Document Revised: 05/30/2019 Document Reviewed: 12/04/2018 Elsevier Patient Education  2021 [employer]. Cervical Cerclage  Cervical cerclage is a surgical procedure to correct a cervix that opens up and thins out before pregnancy is at term. This is also called cervical insufficiency, or incompetent cervix. This condition can cause labor to start early (prematurely). In this procedure, a health  care provider uses stitches (sutures) to sew the cervix shut during pregnancy. Your health care provider may use ultrasound to help guide the procedure and monitor your baby. Ultrasound uses sound waves to take images of your cervix and uterus. The health care provider will assess these images on a monitor in the operating room. Tell a health care provider about:  Any allergies you have, especially any allergies related to prescribed medicine, stitches, or anesthetic medicines.  All medicines you are taking, including vitamins, herbs, eye drops, creams, and over-the-counter medicines.  Your medical history, including prior labor deliveries.  Any problems you or family members have had with anesthetic medicines.  Any blood disorders you have.  Any surgeries you have had, including prior cervical stitching.  Any medical conditions you have or have had. What are the risks? Generally, this is a safe procedure. However, problems may occur, including:  Infection, such as infection of the cervix or the bag of fluid that surrounds the baby (amniotic sac).  Vaginal bleeding.  Allergic reactions to medicines.  Damage to nearby structures or organs, such as injury to the cervix or tearing of the amniotic sac.  Contractions that come too early, including going into early labor and delivery.  Cervical dystocia. This occurs when the cervix is unable to open normally during labor. What happens before the procedure? Staying hydrated Follow instructions from your health care provider about hydration, which may include:  Up to 2 hours before the procedure - you may continue to drink clear liquids, such as water, clear fruit juice, black coffee, and plain tea.   Eating and drinking restrictions Follow instructions from your health care provider about eating and drinking, which may include:  8 hours before the procedure - stop eating heavy meals or foods, such as meat, fried foods, or fatty  foods.  6 hours before the procedure - stop eating light meals or foods, such as toast or cereal.  6 hours before the procedure - stop drinking milk or drinks that contain milk.  2 hours before the procedure - stop drinking clear liquids. Medicines Ask your health care provider about:  Changing or stopping your regular medicines. This is especially important if you are taking diabetes medicines or blood thinners.  Taking medicines such as aspirin and ibuprofen. These medicines can thin your blood. Do not take these medicines unless your health care provider tells you to take them.  Taking over-the-counter medicines, vitamins, herbs, and supplements. Surgery safety Ask your health care provider:  How your surgery site will be marked.  What steps will be taken to help prevent infection. These may include: ? Removing hair at the surgery site. ? Washing skin with a germ-killing soap. ? Taking antibiotic medicine. General instructions  Do not put on any lotion, deodorant, or perfume.  Remove contact lenses and jewelry.  You may have an exam or testing, including blood or urine tests.  Plan to have someone take you home from the hospital or clinic.  If you will be going home right after the procedure, plan to  have someone with you for 24 hours. What happens during the procedure?  An IV will be inserted into one of your veins.  You may be given one or more of the following: ? A medicine to help you relax (sedative). ? A medicine to numb the area (local anesthetic). ? A medicine to make you fall asleep (general anesthetic). ? A medicine that is injected into your spine to numb the area below and slightly above the injection site (spinal anesthetic).  A lubricated instrument (speculum) will be inserted into your vagina. The speculum will be widened to open the walls of your vagina so your surgeon can see your cervix.  Your cervix will be grasped and tightly sutured to close it.  To do this, your surgeon will stitch a strong band of thread around your cervix, then the thread will be tightened to hold your cervix shut. The procedure may vary among health care providers and hospitals. What happens after the procedure?  Your blood pressure, heart rate, breathing rate, and blood oxygen level will be monitored until you leave the hospital or clinic.  You will be monitored for premature contractions.  You may have light bleeding and mild cramping.  You may have to wear compression stockings. These stockings help to prevent blood clots and reduce swelling in your legs.  If you were given a sedative during the procedure, it can affect you for several hours. Do not drive or operate machinery until your health care provider says that it is safe.  You may be put on bed rest.  You may be given an injection of a hormone (progesterone) to prevent premature contractions. Summary  Cervical cerclage is a surgical procedure in which stitches are used to sew the cervix shut during pregnancy.  Before the procedure, tell your health care provider about your medicines, or medical problems or blood disorders that you have.  This is a safe procedure. However, problems may occur, including infection, bleeding, or premature labor.  Follow all instructions about eating and drinking before the procedure. Plan to have someone drive you home from the hospital or clinic. This information is not intended to replace advice given to you by your health care provider. Make sure you discuss any questions you have with your health care provider. Document Revised: 02/04/2019 Document Reviewed: 12/04/2018 Elsevier Patient Education  2021 [employer].

## 2020-09-08 NOTE — Anesthesia Procedure Notes (Signed)
Spinal  Patient location during procedure: OR Start time: 09/08/2020 1:10 PM End time: 09/08/2020 1:15 PM Reason for block: surgical anesthesia Staffing Performed: anesthesiologist  Anesthesiologist: Murvin Natal, MD Preanesthetic Checklist Completed: patient identified, IV checked, risks and benefits discussed, surgical consent, monitors and equipment checked, pre-op evaluation and timeout performed Spinal Block Patient position: sitting Prep: DuraPrep Patient monitoring: cardiac monitor, continuous pulse ox and blood pressure Approach: midline Location: L4-5 Injection technique: single-shot Needle Needle type: Pencan  Needle gauge: 24 G Needle length: 9 cm Assessment Sensory level: T10 Events: CSF return Additional Notes Functioning IV was confirmed and monitors were applied. Sterile prep and drape, including hand hygiene and sterile gloves were used. The patient was positioned and the spine was prepped. The skin was anesthetized with lidocaine.  Free flow of clear CSF was obtained prior to injecting local anesthetic into the CSF.  The spinal needle aspirated freely following injection.  The needle was carefully withdrawn.  The patient tolerated the procedure well.

## 2020-09-08 NOTE — Op Note (Signed)
edure: CERCLAGE CERVICAL   Anesthesia: Spinal   Anesthesiologist: No responsible provider has been recorded for the case.   Attending: Crawford Givens, MD   Assistant:none  Findings: cx about 2-3 cm in length and finger tip dilated  Pathology:none  Fluids: 1000cc  UOP: 200 cc  EBL: minimal  Complications: none  Procedure: The patient was taken to the operating room after the risks benefits and alternatives were discussed with the patient, the patient verbalized understanding and consent signed and witnessed.  The patient was given spinal anesthesia which was tested and adequate.  She was placed in dorsal lithotomy and prepped and draped.  Foley catheter placed.   A weighted speculum was placed in the posterior fourchette. deavers were placed in the vagina to give visualization.  Two ring forceps were placed on the cervix.  A [employer] cervical cerclage was placed with mesiline suture. It was tied with the knot at 12 o clock.   Clindamycin douche was then used.  Hemostasis was noted.  All instruments were removed. The count was correct. The patient was transferred to the recovery room in good condition.

## 2020-09-08 NOTE — Transfer of Care (Signed)
Immediate Anesthesia Transfer of Care Note  Patient: Irena Reichmann  Procedure(s) Performed: CERCLAGE CERVICAL (N/A )  Patient Location: PACU  Anesthesia Type:Spinal  Level of Consciousness: awake  Airway & Oxygen Therapy: Patient Spontanous Breathing  Post-op Assessment: Report given to RN and Post -op Vital signs reviewed and stable  Post vital signs: Reviewed and stable  Last Vitals:  Vitals Value Taken Time  BP 117/72 09/08/20 1400  Temp 36.9 C 09/08/20 1400  Pulse 85 09/08/20 1401  Resp 19 09/08/20 1401  SpO2 100 % 09/08/20 1401  Vitals shown include unvalidated device data.  Last Pain:  Vitals:   09/08/20 1400  TempSrc: Oral  PainSc:          Complications: No complications documented.

## 2020-09-08 NOTE — Anesthesia Postprocedure Evaluation (Signed)
Anesthesia Post Note  Patient: Carla Henderson  Procedure(s) Performed: CERCLAGE CERVICAL (N/A )     Patient location during evaluation: PACU Anesthesia Type: Spinal Level of consciousness: awake Pain management: pain level controlled Vital Signs Assessment: post-procedure vital signs reviewed and stable Respiratory status: spontaneous breathing, respiratory function stable and patient connected to nasal cannula oxygen Cardiovascular status: blood pressure returned to baseline and stable Postop Assessment: no headache, no backache and no apparent nausea or vomiting Anesthetic complications: no   No complications documented.  Last Vitals:  Vitals:   09/08/20 1530 09/08/20 1545  BP: 135/84 122/77  Pulse: 92 97  Resp: (!) 21 17  Temp:  36.8 C  SpO2: 100% 100%    Last Pain:  Vitals:   09/08/20 1545  TempSrc: Oral  PainSc: 0-No pain   Pain Goal:                Epidural/Spinal Function Cutaneous sensation: Normal sensation (09/08/20 1545), Patient able to flex knees: Yes (09/08/20 1545), Patient able to lift hips off bed: Yes (09/08/20 1545), Back pain beyond tenderness at insertion site: No (09/08/20 1545), Progressively worsening motor and/or sensory loss: No (09/08/20 1545), Bowel and/or bladder incontinence post epidural: No (09/08/20 1545)  Asaad Gulley P Jacquetta Polhamus

## 2020-09-08 NOTE — H&P (Signed)
Carla Henderson is a 30 y.o. female presenting for cerclage . Pt with H/o incompetent cervix and cerclage in the past.  No bleeding, LOF or abdominal pain.   OB History    Gravida  4   Para  1   Term  1   Preterm      AB  2   Living  1     SAB  2   IAB      Ectopic      Multiple  0   Live Births  1          Past Medical History:  Diagnosis Date  . Abnormal Pap smear   . Anemia   . Bronchitis    x 1 time  . HSV (herpes simplex virus) anogenital infection   . Infertility, female   . Ovarian cyst   . PCOS (polycystic ovarian syndrome)   . Spinal headache   . UTI (lower urinary tract infection)   . Vaginal Pap smear, abnormal    Past Surgical History:  Procedure Laterality Date  . CERVICAL CERCLAGE    . CERVICAL CERCLAGE N/A 09/13/2017   Procedure: CERCLAGE CERVICAL;  Surgeon: Crawford Givens, MD;  Location: [facility];  Service: Gynecology;  Laterality: N/A;  . CHOLECYSTECTOMY    . LAPAROSCOPIC CHOLECYSTECTOMY SINGLE SITE WITH INTRAOPERATIVE CHOLANGIOGRAM N/A 03/01/2017   Procedure: LAPAROSCOPIC CHOLECYSTECTOMY SINGLE SITE WITH INTRAOPERATIVE CHOLANGIOGRAM;  Surgeon: Michael Boston, MD;  Location: [facility];  Service: General;  Laterality: N/A;  . LAPAROSCOPIC OVARIAN CYSTECTOMY Left 01/07/2015   Procedure: Attempted LAPAROSCOPIC OVARIAN CYSTECTOMY, Mini laparotomy with excision of left ovarian cyst;  Surgeon: Mora Bellman, MD;  Location: [facility];  Service: Gynecology;  Laterality: Left;  Requested 01-26-15 @ 3:30p  . WISDOM TOOTH EXTRACTION     Family History: family history includes Diabetes in her paternal grandfather; Healthy in her father; Hypertension in her mother and paternal grandmother. Social History:  reports that she has never smoked. She has never used smokeless tobacco. She reports previous alcohol use. She reports that she does not use drugs.     Maternal Diabetes: No Genetic Screening: Normal Maternal Ultrasounds/Referrals: Normal Fetal Ultrasounds  or other Referrals:  None Maternal Substance Abuse:  No Significant Maternal Medications:  None Significant Maternal Lab Results:  None Other Comments:  None  Review of Systems History   Blood pressure 138/87, pulse 85, temperature 98.5 F (36.9 C), temperature source Oral, resp. rate 16, height 5' 9"  (1.753 m), weight 99.8 kg, SpO2 100 %, currently breastfeeding. Exam Physical Exam  Physical Examination: General appearance - alert, well appearing, and in no distress Chest - clear to auscultation, no wheezes, rales or rhonchi, symmetric air entry Heart - normal rate and regular rhythm Abdomen - soft, nontender, nondistended, no masses or organomegaly gravid Pelvic - normal external genitalia, vulva, vagina, cervix, uterus and adnexa Prenatal labs: ABO, Rh: --/--/A POS (05/18 1941) Antibody: NEG (05/18 0741) Rubella:   RPR:    HBsAg:    HIV:    GBS:     Assessment/Plan: H/O incompetent cervix Plan for cerclage Pt understands the risks and benefits   Carla Henderson 09/08/2020, 9:31 AM

## 2020-09-08 NOTE — Anesthesia Preprocedure Evaluation (Signed)
Anesthesia Evaluation  Patient identified by MRN, date of birth, ID band Patient awake    Reviewed: Allergy & Precautions, NPO status , Patient's Chart, lab work & pertinent test results  Airway Mallampati: I  TM Distance: >3 FB Neck ROM: Full    Dental no notable dental hx.    Pulmonary neg pulmonary ROS,    Pulmonary exam normal breath sounds clear to auscultation       Cardiovascular negative cardio ROS Normal cardiovascular exam Rhythm:Regular Rate:Normal     Neuro/Psych  Headaches, negative psych ROS   GI/Hepatic negative GI ROS, (+) Hepatitis -  Endo/Other  PCOS (polycystic ovarian syndrome)  Renal/GU negative Renal ROS     Musculoskeletal negative musculoskeletal ROS (+)   Abdominal (+) + obese,   Peds  Hematology  (+) anemia ,   Anesthesia Other Findings INCOMPETENT CERVIX  Reproductive/Obstetrics (+) Pregnancy                             Anesthesia Physical Anesthesia Plan  ASA: II  Anesthesia Plan: Spinal   Post-op Pain Management:    Induction: Intravenous  PONV Risk Score and Plan: 2 and Ondansetron and Treatment may vary due to age or medical condition  Airway Management Planned: Natural Airway  Additional Equipment:   Intra-op Plan:   Post-operative Plan:   Informed Consent: I have reviewed the patients History and Physical, chart, labs and discussed the procedure including the risks, benefits and alternatives for the proposed anesthesia with the patient or authorized representative who has indicated his/her understanding and acceptance.     Dental advisory given  Plan Discussed with: CRNA  Anesthesia Plan Comments:         Anesthesia Quick Evaluation

## 2020-09-15 ENCOUNTER — Other Ambulatory Visit: Payer: Self-pay | Admitting: Obstetrics and Gynecology

## 2020-09-15 DIAGNOSIS — Z363 Encounter for antenatal screening for malformations: Secondary | ICD-10-CM

## 2020-09-16 ENCOUNTER — Ambulatory Visit: Payer: Medicaid Other | Admitting: *Deleted

## 2020-09-16 ENCOUNTER — Ambulatory Visit: Payer: Medicaid Other | Attending: Obstetrics and Gynecology

## 2020-09-16 ENCOUNTER — Other Ambulatory Visit: Payer: Self-pay

## 2020-09-16 VITALS — BP 123/77 | HR 93

## 2020-09-16 DIAGNOSIS — Z3686 Encounter for antenatal screening for cervical length: Secondary | ICD-10-CM

## 2020-09-16 DIAGNOSIS — Z3A16 16 weeks gestation of pregnancy: Secondary | ICD-10-CM

## 2020-09-16 DIAGNOSIS — O09292 Supervision of pregnancy with other poor reproductive or obstetric history, second trimester: Secondary | ICD-10-CM

## 2020-09-16 DIAGNOSIS — N883 Incompetence of cervix uteri: Secondary | ICD-10-CM | POA: Diagnosis present

## 2020-09-16 DIAGNOSIS — Z8632 Personal history of gestational diabetes: Secondary | ICD-10-CM

## 2020-09-16 DIAGNOSIS — Z363 Encounter for antenatal screening for malformations: Secondary | ICD-10-CM | POA: Diagnosis present

## 2020-09-16 DIAGNOSIS — O99212 Obesity complicating pregnancy, second trimester: Secondary | ICD-10-CM

## 2020-09-16 DIAGNOSIS — E669 Obesity, unspecified: Secondary | ICD-10-CM

## 2020-09-16 DIAGNOSIS — O3432 Maternal care for cervical incompetence, second trimester: Secondary | ICD-10-CM | POA: Diagnosis not present

## 2020-09-21 ENCOUNTER — Other Ambulatory Visit: Payer: Self-pay | Admitting: *Deleted

## 2020-09-21 DIAGNOSIS — O3430 Maternal care for cervical incompetence, unspecified trimester: Secondary | ICD-10-CM

## 2020-10-08 ENCOUNTER — Encounter: Payer: Self-pay | Admitting: *Deleted

## 2020-10-08 ENCOUNTER — Ambulatory Visit: Payer: Medicaid Other | Admitting: *Deleted

## 2020-10-08 ENCOUNTER — Other Ambulatory Visit: Payer: Self-pay | Admitting: Obstetrics

## 2020-10-08 ENCOUNTER — Ambulatory Visit: Payer: Medicaid Other | Attending: Obstetrics

## 2020-10-08 ENCOUNTER — Other Ambulatory Visit: Payer: Self-pay

## 2020-10-08 VITALS — BP 114/62 | HR 89

## 2020-10-08 DIAGNOSIS — O99212 Obesity complicating pregnancy, second trimester: Secondary | ICD-10-CM

## 2020-10-08 DIAGNOSIS — Z3A19 19 weeks gestation of pregnancy: Secondary | ICD-10-CM

## 2020-10-08 DIAGNOSIS — O3430 Maternal care for cervical incompetence, unspecified trimester: Secondary | ICD-10-CM | POA: Diagnosis present

## 2020-10-08 DIAGNOSIS — E669 Obesity, unspecified: Secondary | ICD-10-CM

## 2020-10-08 DIAGNOSIS — Z363 Encounter for antenatal screening for malformations: Secondary | ICD-10-CM | POA: Diagnosis not present

## 2020-10-08 DIAGNOSIS — O3432 Maternal care for cervical incompetence, second trimester: Secondary | ICD-10-CM

## 2020-10-08 DIAGNOSIS — Z3686 Encounter for antenatal screening for cervical length: Secondary | ICD-10-CM

## 2020-10-08 DIAGNOSIS — O09292 Supervision of pregnancy with other poor reproductive or obstetric history, second trimester: Secondary | ICD-10-CM

## 2020-10-08 DIAGNOSIS — Z8632 Personal history of gestational diabetes: Secondary | ICD-10-CM

## 2020-10-11 ENCOUNTER — Other Ambulatory Visit: Payer: Self-pay | Admitting: *Deleted

## 2020-10-11 DIAGNOSIS — Z362 Encounter for other antenatal screening follow-up: Secondary | ICD-10-CM

## 2020-11-05 ENCOUNTER — Ambulatory Visit: Payer: Medicaid Other | Admitting: *Deleted

## 2020-11-05 ENCOUNTER — Encounter: Payer: Self-pay | Admitting: *Deleted

## 2020-11-05 ENCOUNTER — Ambulatory Visit: Payer: Medicaid Other | Attending: Maternal & Fetal Medicine

## 2020-11-05 ENCOUNTER — Other Ambulatory Visit: Payer: Self-pay

## 2020-11-05 VITALS — BP 111/65 | HR 92

## 2020-11-05 DIAGNOSIS — O99212 Obesity complicating pregnancy, second trimester: Secondary | ICD-10-CM | POA: Diagnosis not present

## 2020-11-05 DIAGNOSIS — E669 Obesity, unspecified: Secondary | ICD-10-CM

## 2020-11-05 DIAGNOSIS — Z3A23 23 weeks gestation of pregnancy: Secondary | ICD-10-CM

## 2020-11-05 DIAGNOSIS — O09292 Supervision of pregnancy with other poor reproductive or obstetric history, second trimester: Secondary | ICD-10-CM

## 2020-11-05 DIAGNOSIS — Z8632 Personal history of gestational diabetes: Secondary | ICD-10-CM

## 2020-11-05 DIAGNOSIS — Z362 Encounter for other antenatal screening follow-up: Secondary | ICD-10-CM | POA: Insufficient documentation

## 2020-11-05 DIAGNOSIS — O3432 Maternal care for cervical incompetence, second trimester: Secondary | ICD-10-CM | POA: Diagnosis present

## 2020-12-30 ENCOUNTER — Other Ambulatory Visit: Payer: Self-pay

## 2020-12-30 ENCOUNTER — Encounter: Payer: Medicaid Other | Attending: Obstetrics and Gynecology | Admitting: Registered"

## 2020-12-30 ENCOUNTER — Ambulatory Visit: Payer: Medicaid Other | Admitting: Registered"

## 2020-12-30 DIAGNOSIS — Z3A00 Weeks of gestation of pregnancy not specified: Secondary | ICD-10-CM | POA: Diagnosis not present

## 2020-12-30 DIAGNOSIS — O24419 Gestational diabetes mellitus in pregnancy, unspecified control: Secondary | ICD-10-CM

## 2020-12-30 NOTE — Progress Notes (Signed)
Diabetes education referral corrected prior to appt today.  Apolonio Schneiders RN 12/30/20

## 2020-12-30 NOTE — Progress Notes (Signed)
Patient was seen for Gestational Diabetes self-management on 12/30/20  Start time 1530 and End time 1600   Estimated due date: 03/01/21; [redacted]w[redacted]d Clinical: Medications: vit D, prenatal vitamin Medical History: GDM in pregnancy with miscarriage Labs: OGTT n/a , A1c n/a %   Dietary and Lifestyle History: Patient states she has had gestational diabetes in the past. Pt reports remembering the education and feels confident she knows how to manage.   Pt reports that she eats healthy including vegetables and has followed the Mediterranean diet in the past. Pt reports she does not like dairy, uses unsweetened almond milk.  Pt states she likes lemonade but stick with mostly just cold water for her beverages.  Physical Activity: not assessed Stress: not assessed Sleep: not assessed  24 hr Recall: not assessed  NUTRITION INTERVENTION  Nutrition education (E-1) on the following topics:   Initial Follow-up  [x]  []  Definition of Gestational Diabetes []  []  Why dietary management is important in controlling blood glucose [x]  []  Effects each nutrient has on blood glucose levels [x]  []  Simple carbohydrates vs complex carbohydrates [x]  []  Fluid intake [x]  []  Creating a balanced meal plan [x]  []  Carbohydrate counting  [x]  []  When to check blood glucose levels [x]  []  Proper blood glucose monitoring techniques [x]  []  Effect of stress and stress reduction techniques  [x]  []  Exercise effect on blood glucose levels, appropriate exercise during pregnancy [x]  []  Importance of limiting caffeine and abstaining from alcohol and smoking [x]  []  Medications used for blood sugar control during pregnancy [x]  []  Hypoglycemia and rule of 15 [x]  []  Postpartum self care  Blood glucose monitor given: Accu-chek Guide Me Lot [id number]: 03/02/2022 CBG: 103 mg/dL  Patient instructed to monitor glucose levels: FBS: 60 - ? 95 mg/dL (some clinics use 90 for cutoff) 1 hour: ? 140 mg/dL 2 hour: ? 120  mg/dL  Patient received handouts: Nutrition Diabetes and Pregnancy Carbohydrate Counting List  Patient will be seen for follow-up as needed.

## 2021-02-01 LAB — OB RESULTS CONSOLE GBS: GBS: NEGATIVE

## 2021-02-05 ENCOUNTER — Encounter (HOSPITAL_COMMUNITY): Payer: Self-pay | Admitting: Obstetrics & Gynecology

## 2021-02-05 ENCOUNTER — Inpatient Hospital Stay (HOSPITAL_COMMUNITY)
Admission: AD | Admit: 2021-02-05 | Discharge: 2021-02-05 | Disposition: A | Discharge status: Home / Self Care | Payer: Medicaid Other | Attending: Obstetrics & Gynecology | Admitting: Obstetrics & Gynecology

## 2021-02-05 ENCOUNTER — Other Ambulatory Visit: Payer: Self-pay

## 2021-02-05 DIAGNOSIS — R519 Headache, unspecified: Secondary | ICD-10-CM | POA: Diagnosis not present

## 2021-02-05 DIAGNOSIS — Z3689 Encounter for other specified antenatal screening: Secondary | ICD-10-CM

## 2021-02-05 DIAGNOSIS — O26893 Other specified pregnancy related conditions, third trimester: Secondary | ICD-10-CM | POA: Diagnosis not present

## 2021-02-05 DIAGNOSIS — Z3A36 36 weeks gestation of pregnancy: Secondary | ICD-10-CM | POA: Insufficient documentation

## 2021-02-05 DIAGNOSIS — R030 Elevated blood-pressure reading, without diagnosis of hypertension: Secondary | ICD-10-CM | POA: Diagnosis not present

## 2021-02-05 LAB — URINALYSIS, ROUTINE W REFLEX MICROSCOPIC
Bacteria, UA: NONE SEEN
Bilirubin Urine: NEGATIVE
Glucose, UA: NEGATIVE mg/dL
Ketones, ur: NEGATIVE mg/dL
Leukocytes,Ua: NEGATIVE
Nitrite: NEGATIVE
Protein, ur: NEGATIVE mg/dL
Specific Gravity, Urine: 1.012 (ref 1.005–1.030)
pH: 6 (ref 5.0–8.0)

## 2021-02-05 MED ORDER — CYCLOBENZAPRINE HCL 5 MG PO TABS: 10.0000 mg | ORAL_TABLET | Freq: Once | ORAL | Status: DC

## 2021-02-05 NOTE — MAU Provider Note (Signed)
History     CSN: [id number]  Arrival date and time: 02/05/21 5009   Event Date/Time   First Provider Initiated Contact with Patient 02/05/21 [phone]      Chief Complaint  Patient presents with   Headache   Carla Henderson is a 30 y.o. G4P1021 at 52w4dwho [facility]  She presents today for Headache.  She states that her headache started at 8 PM and is on her left side behind her eyebrow.  She describes the headache as a "dull nagging pain" fluctuating in intensity.  She rates the pain a 7/10 at its worst and a 5/10 at its mildest.  She reports she ate and then went to sleep but woke up at about 0300 with a headache still present despite taking magnesium 4072m  She states she got concerned and took her blood pressure which was 149/97.  She reports she had GHTN in her previous pregnancy.  She endorses fetal movement and denies vaginal discharge, bleeding, or leaking.  Patient also reports nausea, but none currently    OB History     Gravida  4   Para  1   Term  1   Preterm      AB  2   Living  1      SAB  2   IAB      Ectopic      Multiple  0   Live Births  1           Past Medical History:  Diagnosis Date   Abnormal Pap smear    Anemia    Bronchitis    x 1 time   HSV (herpes simplex virus) anogenital infection    Infertility, female    Ovarian cyst    PCOS (polycystic ovarian syndrome)    Spinal headache    UTI (lower urinary tract infection)    Vaginal Pap smear, abnormal     Past Surgical History:  Procedure Laterality Date   CERVICAL CERCLAGE     CERVICAL CERCLAGE N/A 09/13/2017   Procedure: CERCLAGE CERVICAL;  Surgeon: DiCrawford GivensMD;  [facility];  Service: Gynecology;  Laterality: N/A;   CERVICAL CERCLAGE N/A 09/08/2020   Procedure: CERCLAGE CERVICAL;  Surgeon: DiCrawford GivensMD;  Location: [facility];  Service: Gynecology;  Laterality: N/A;   CHOLECYSTECTOMY     LAPAROSCOPIC CHOLECYSTECTOMY SINGLE SITE WITH  INTRAOPERATIVE CHOLANGIOGRAM N/A 03/01/2017   Procedure: LAPAROSCOPIC CHOLECYSTECTOMY SINGLE SITE WITH INTRAOPERATIVE CHOLANGIOGRAM;  Surgeon: GrMichael BostonMD;  Location: [facility];  Service: General;  Laterality: N/A;   LAPAROSCOPIC OVARIAN CYSTECTOMY Left 01/07/2015   Procedure: Attempted LAPAROSCOPIC OVARIAN CYSTECTOMY, Mini laparotomy with excision of left ovarian cyst;  Surgeon: PeMora BellmanMD;  [facility];  Service: Gynecology;  Laterality: Left;  Requested 01-26-15 @ 3:30p   WISDOM TOOTH EXTRACTION      Family History  Problem Relation Age of Onset   Hypertension Mother    Healthy Father    Hypertension Paternal Grandmother    Diabetes Paternal Grandfather    Other Neg Hx     Social History   Tobacco Use   Smoking status: Never   Smokeless tobacco: Never  Vaping Use   Vaping Use: Never used  Substance Use Topics   Alcohol use: Not Currently    Comment: not while rpeg   Drug use: No    Allergies:  Allergies  Allergen Reactions   Vicodin [Hydrocodone-Acetaminophen] Nausea And Vomiting and Other (See  Comments)    Pt states does not cause swelling    Medications Prior to Admission  Medication Sig Dispense Refill Last Dose   magnesium oxide (MAG-OX) 400 MG tablet Take 400 mg by mouth daily.   02/05/2021 at 0400   Prenatal Vit-Fe Fumarate-FA (PRENATAL MULTIVITAMIN) TABS tablet Take 1 tablet by mouth daily at 12 noon.   02/04/2021   Cholecalciferol (VITAMIN D) 50 MCG (2000 UT) CAPS Take 2,000 Units by mouth daily.      hydroxyprogesterone caproate (MAKENA) 250 mg/mL OIL injection Inject 250 mg into the muscle once a week. (Patient not taking: Reported on 10/08/2020)      valACYclovir (VALTREX) 1000 MG tablet Take 1,000 mg by mouth 2 (two) times daily as needed (outbreaks).       Review of Systems  Respiratory:  Negative for cough and shortness of breath.   Gastrointestinal:  Positive for nausea. Negative for abdominal pain, constipation, diarrhea and vomiting.   Genitourinary:  Negative for difficulty urinating, dysuria, vaginal bleeding and vaginal discharge.  Musculoskeletal:  Negative for back pain.  Neurological:  Positive for headaches. Negative for dizziness and light-headedness.  Physical Exam   Blood pressure 126/72, pulse 82, temperature 98.4 F (36.9 C), temperature source Oral, resp. rate 18, height 5' 9"  (1.753 m), weight 106.8 kg, last menstrual period 05/25/2020, currently breastfeeding.  Vitals:   02/05/21 0544 02/05/21 0612 02/05/21 0631 02/05/21 0649  BP: 128/80 124/85 124/74 126/72  Pulse: 99 92 78 82  Resp: 18     Temp: 98.4 F (36.9 C)     TempSrc: Oral     Weight: 106.8 kg     Height: 5' 9"  (1.753 m)       Physical Exam Vitals reviewed.  Constitutional:      Appearance: She is well-developed.  HENT:     Head: Normocephalic and atraumatic.  Cardiovascular:     Rate and Rhythm: Normal rate.  Pulmonary:     Effort: Pulmonary effort is normal.  Abdominal:     Palpations: Abdomen is soft.     Comments: Gravid, Appears LGA  Musculoskeletal:     Cervical back: Normal range of motion.  Skin:    General: Skin is warm and dry.  Neurological:     Mental Status: She is alert and oriented to person, place, and time.  Psychiatric:        Mood and Affect: Mood normal.        Speech: Speech normal.        Behavior: Behavior normal.    Fetal Assessment 135 bpm, Mod Var, -Decels, +Accels Toco: 2 Ctx graphed  MAU Course   Results for orders placed or performed during the hospital encounter of 02/05/21 (from the past 24 hour(s))  Urinalysis, Routine w reflex microscopic     Status: Abnormal   Collection Time: 02/05/21  5:50 AM  Result Value Ref Range   Color, Urine YELLOW YELLOW   APPearance CLEAR CLEAR   Specific Gravity, Urine 1.012 1.005 - 1.030   pH 6.0 5.0 - 8.0   Glucose, UA NEGATIVE NEGATIVE mg/dL   Hgb urine dipstick SMALL (A) NEGATIVE   Bilirubin Urine NEGATIVE NEGATIVE   Ketones, ur NEGATIVE NEGATIVE  mg/dL   Protein, ur NEGATIVE NEGATIVE mg/dL   Nitrite NEGATIVE NEGATIVE   Leukocytes,Ua NEGATIVE NEGATIVE   RBC / HPF 0-5 0 - 5 RBC/hpf   WBC, UA 0-5 0 - 5 WBC/hpf   Bacteria, UA NONE SEEN NONE SEEN   Squamous Epithelial /  LPF 0-5 0 - 5   Mucus PRESENT    No results found.  MDM PE Labs: None EFM  Assessment and Plan  30 year old G4P1021  SIUP at 36.4 weeks Cat I FT Headache Incident of Elevated BP at Home  -POC Reviewed -Exam performed -Informed that blood pressures have been normotensive since arrival. -Brief education on how to take blood pressure properly. -Discussed what could have contributed to abnormal reading.  -Informed that provider will monitor and if continues to be normotensive will dsicharge. -Informed that no indication for preeclampsia testing at this time. -Offered and declines pain medication for headache. -Will monitor and reassess.    Maryann Conners MSN, CNM 02/05/2021, 6:24 AM   Reassessment (6:51 AM)  -BP remain normotensive. -Instructed to keep appt as scheduled for Wednesday. -Encouraged to call primary office or return to MAU if symptoms worsen or with the onset of new symptoms. -Discharged to home in stable condition.  Maryann Conners MSN, CNM Advanced Practice Provider, [facility]

## 2021-02-05 NOTE — MAU Note (Signed)
PT SAYS HAS H/A- STARTED 8PM- NO TYLENOL TOOK MAGNESIUM- NO RELIEF THIS AM STILL HAS H/A- NO MEDS [facility]- DR DILLARD HAS HX H/A - 1 MTH AGO ALL BP'S IN OFFICE HAVE BEEN GOOD  AT HOME THIS AM - 149/97

## 2021-02-08 ENCOUNTER — Other Ambulatory Visit: Payer: Self-pay | Admitting: Obstetrics and Gynecology

## 2021-02-17 ENCOUNTER — Telehealth (HOSPITAL_COMMUNITY): Payer: Self-pay | Admitting: *Deleted

## 2021-02-17 ENCOUNTER — Encounter (HOSPITAL_COMMUNITY): Payer: Self-pay | Admitting: *Deleted

## 2021-02-17 ENCOUNTER — Encounter (HOSPITAL_COMMUNITY): Payer: Self-pay

## 2021-02-17 NOTE — Telephone Encounter (Signed)
Preadmission screen  

## 2021-02-17 NOTE — Telephone Encounter (Unsigned)
Preadmission screen  

## 2021-02-21 ENCOUNTER — Other Ambulatory Visit: Payer: Self-pay | Admitting: Obstetrics & Gynecology

## 2021-02-22 ENCOUNTER — Other Ambulatory Visit: Payer: Self-pay

## 2021-02-22 ENCOUNTER — Inpatient Hospital Stay (HOSPITAL_COMMUNITY)
Admission: AD | Admit: 2021-02-22 | Discharge: 2021-02-24 | DRG: 787 | Disposition: A | Discharge status: Home / Self Care | Payer: Medicaid Other | Attending: Obstetrics & Gynecology | Admitting: Obstetrics & Gynecology

## 2021-02-22 ENCOUNTER — Encounter (HOSPITAL_COMMUNITY): Payer: Self-pay | Admitting: Obstetrics & Gynecology

## 2021-02-22 ENCOUNTER — Inpatient Hospital Stay (HOSPITAL_COMMUNITY): Payer: Medicaid Other | Admitting: Certified Registered Nurse Anesthetist

## 2021-02-22 ENCOUNTER — Encounter (HOSPITAL_COMMUNITY)
Admission: AD | Disposition: A | Discharge status: Home / Self Care | Payer: Self-pay | Source: Home / Self Care | Attending: Obstetrics & Gynecology

## 2021-02-22 DIAGNOSIS — O4290 Premature rupture of membranes, unspecified as to length of time between rupture and onset of labor, unspecified weeks of gestation: Secondary | ICD-10-CM | POA: Diagnosis present

## 2021-02-22 DIAGNOSIS — O24410 Gestational diabetes mellitus in pregnancy, diet controlled: Secondary | ICD-10-CM

## 2021-02-22 DIAGNOSIS — O4292 Full-term premature rupture of membranes, unspecified as to length of time between rupture and onset of labor: Secondary | ICD-10-CM | POA: Diagnosis present

## 2021-02-22 DIAGNOSIS — O9832 Other infections with a predominantly sexual mode of transmission complicating childbirth: Secondary | ICD-10-CM | POA: Diagnosis present

## 2021-02-22 DIAGNOSIS — Z3A39 39 weeks gestation of pregnancy: Secondary | ICD-10-CM | POA: Diagnosis not present

## 2021-02-22 DIAGNOSIS — Z98891 History of uterine scar from previous surgery: Secondary | ICD-10-CM

## 2021-02-22 DIAGNOSIS — Z3689 Encounter for other specified antenatal screening: Secondary | ICD-10-CM

## 2021-02-22 DIAGNOSIS — O24420 Gestational diabetes mellitus in childbirth, diet controlled: Secondary | ICD-10-CM | POA: Diagnosis present

## 2021-02-22 DIAGNOSIS — O99214 Obesity complicating childbirth: Secondary | ICD-10-CM | POA: Diagnosis present

## 2021-02-22 DIAGNOSIS — O2686 Pruritic urticarial papules and plaques of pregnancy (PUPPP): Secondary | ICD-10-CM

## 2021-02-22 DIAGNOSIS — O134 Gestational [pregnancy-induced] hypertension without significant proteinuria, complicating childbirth: Secondary | ICD-10-CM | POA: Diagnosis present

## 2021-02-22 DIAGNOSIS — O34211 Maternal care for low transverse scar from previous cesarean delivery: Secondary | ICD-10-CM

## 2021-02-22 DIAGNOSIS — O9081 Anemia of the puerperium: Secondary | ICD-10-CM | POA: Diagnosis not present

## 2021-02-22 DIAGNOSIS — Z3A38 38 weeks gestation of pregnancy: Secondary | ICD-10-CM

## 2021-02-22 DIAGNOSIS — A6000 Herpesviral infection of urogenital system, unspecified: Secondary | ICD-10-CM | POA: Diagnosis present

## 2021-02-22 DIAGNOSIS — O24419 Gestational diabetes mellitus in pregnancy, unspecified control: Secondary | ICD-10-CM | POA: Diagnosis present

## 2021-02-22 DIAGNOSIS — E663 Overweight: Secondary | ICD-10-CM | POA: Diagnosis present

## 2021-02-22 LAB — GLUCOSE, CAPILLARY: Glucose-Capillary: 96 mg/dL (ref 70–99)

## 2021-02-22 LAB — COMPREHENSIVE METABOLIC PANEL
ALT: 17 U/L (ref 0–44)
AST: 15 U/L (ref 15–41)
Albumin: 2.9 g/dL — ABNORMAL LOW (ref 3.5–5.0)
Alkaline Phosphatase: 90 U/L (ref 38–126)
Anion gap: 8 (ref 5–15)
BUN: 6 mg/dL (ref 6–20)
CO2: 21 mmol/L — ABNORMAL LOW (ref 22–32)
Calcium: 8.8 mg/dL — ABNORMAL LOW (ref 8.9–10.3)
Chloride: 107 mmol/L (ref 98–111)
Creatinine, Ser: 0.65 mg/dL (ref 0.44–1.00)
GFR, Estimated: >60 mL/min (ref >60)
Glucose, Bld: 112 mg/dL — ABNORMAL HIGH (ref 70–99)
Potassium: 3.6 mmol/L (ref 3.5–5.1)
Sodium: 136 mmol/L (ref 135–145)
Total Bilirubin: 0.3 mg/dL (ref 0.3–1.2)
Total Protein: 6.1 g/dL — ABNORMAL LOW (ref 6.5–8.1)

## 2021-02-22 LAB — TYPE AND SCREEN
ABO/RH(D): A POS
Antibody Screen: NEGATIVE

## 2021-02-22 LAB — LACTATE DEHYDROGENASE: LDH: 107 U/L (ref 98–192)

## 2021-02-22 LAB — URIC ACID: Uric Acid, Serum: 5.4 mg/dL (ref 2.5–7.1)

## 2021-02-22 LAB — PROTEIN / CREATININE RATIO, URINE
Creatinine, Urine: 194.75 mg/dL
Protein Creatinine Ratio: 0.12 mg/mg{Cre} (ref 0.00–0.15)
Total Protein, Urine: 24 mg/dL

## 2021-02-22 LAB — CBC
HCT: 33.2 % — ABNORMAL LOW (ref 36.0–46.0)
Hemoglobin: 11.4 g/dL — ABNORMAL LOW (ref 12.0–15.0)
MCH: 31.3 pg (ref 26.0–34.0)
MCHC: 34.3 g/dL (ref 30.0–36.0)
MCV: 91.2 fL (ref 80.0–100.0)
Platelets: 205 10*3/uL (ref 150–400)
RBC: 3.64 MIL/uL — ABNORMAL LOW (ref 3.87–5.11)
RDW: 13.2 % (ref 11.5–15.5)
WBC: 4.8 10*3/uL (ref 4.0–10.5)
nRBC: 0 % (ref 0.0–0.2)

## 2021-02-22 LAB — SARS CORONAVIRUS 2 (TAT 6-24 HRS): SARS Coronavirus 2: NEGATIVE

## 2021-02-22 LAB — POCT FERN TEST: POCT Fern Test: POSITIVE

## 2021-02-22 SURGERY — Surgical Case
Anesthesia: General

## 2021-02-22 MED ORDER — DIPHENHYDRAMINE HCL 25 MG PO CAPS: 25.0000 mg | ORAL_CAPSULE | Freq: Four times a day (QID) | ORAL | Status: DC | PRN

## 2021-02-22 MED ORDER — SUCCINYLCHOLINE CHLORIDE 200 MG/10ML IV SOSY
PREFILLED_SYRINGE | INTRAVENOUS | Status: AC
  Filled 2021-02-22: qty 10

## 2021-02-22 MED ORDER — HYDROMORPHONE HCL 1 MG/ML IJ SOLN: 0.2500 mg | INTRAMUSCULAR | Status: DC | PRN

## 2021-02-22 MED ORDER — SIMETHICONE 80 MG PO CHEW
80.0000 mg | CHEWABLE_TABLET | Freq: Three times a day (TID) | ORAL | Status: DC
  Administered 2021-02-23 – 2021-02-24 (×4): 80 mg via ORAL
  Filled 2021-02-22 (×4): qty 1

## 2021-02-22 MED ORDER — DIBUCAINE (PERIANAL) 1 % EX OINT: 1.0000 "application " | TOPICAL_OINTMENT | CUTANEOUS | Status: DC | PRN

## 2021-02-22 MED ORDER — OXYCODONE-ACETAMINOPHEN 5-325 MG PO TABS: 1.0000 | ORAL_TABLET | ORAL | Status: DC | PRN

## 2021-02-22 MED ORDER — SODIUM CHLORIDE 0.9 % IR SOLN
Status: DC | PRN
  Administered 2021-02-22: 1

## 2021-02-22 MED ORDER — OXYTOCIN-SODIUM CHLORIDE 30-0.9 UT/500ML-% IV SOLN
1.0000 m[IU]/min | INTRAVENOUS | Status: DC
Start: 2021-02-22 — End: 2021-02-22
  Filled 2021-02-22: qty 500

## 2021-02-22 MED ORDER — HYDROMORPHONE HCL 1 MG/ML IJ SOLN
0.2000 mg | INTRAMUSCULAR | Status: DC | PRN
  Administered 2021-02-23: 0.2 mg via INTRAVENOUS
  Filled 2021-02-22: qty 1

## 2021-02-22 MED ORDER — SOD CITRATE-CITRIC ACID 500-334 MG/5ML PO SOLN: 30.0000 mL | ORAL | Status: DC | PRN

## 2021-02-22 MED ORDER — FENTANYL CITRATE (PF) 250 MCG/5ML IJ SOLN
INTRAMUSCULAR | Status: DC | PRN
  Administered 2021-02-22 (×2): 250 ug via INTRAVENOUS

## 2021-02-22 MED ORDER — LACTATED RINGERS IV SOLN
500.0000 mL | INTRAVENOUS | Status: DC | PRN
  Administered 2021-02-22: 500 mL via INTRAVENOUS

## 2021-02-22 MED ORDER — ROCURONIUM BROMIDE 100 MG/10ML IV SOLN
INTRAVENOUS | Status: DC | PRN
  Administered 2021-02-22: 20 mg via INTRAVENOUS

## 2021-02-22 MED ORDER — LACTATED RINGERS IV SOLN: INTRAVENOUS | Status: DC

## 2021-02-22 MED ORDER — MISOPROSTOL 50MCG HALF TABLET
50.0000 ug | ORAL_TABLET | Freq: Once | ORAL | Status: AC
  Administered 2021-02-22: 50 ug via BUCCAL
  Filled 2021-02-22: qty 1

## 2021-02-22 MED ORDER — MENTHOL 3 MG MT LOZG: 1.0000 | LOZENGE | OROMUCOSAL | Status: DC | PRN

## 2021-02-22 MED ORDER — FENTANYL CITRATE (PF) 250 MCG/5ML IJ SOLN
INTRAMUSCULAR | Status: AC
  Filled 2021-02-22: qty 5

## 2021-02-22 MED ORDER — PRENATAL MULTIVITAMIN CH
1.0000 | ORAL_TABLET | Freq: Every day | ORAL | Status: DC
  Administered 2021-02-23 – 2021-02-24 (×2): 1 via ORAL
  Filled 2021-02-22 (×2): qty 1

## 2021-02-22 MED ORDER — OXYTOCIN-SODIUM CHLORIDE 30-0.9 UT/500ML-% IV SOLN: 2.5000 [IU]/h | INTRAVENOUS | Status: DC

## 2021-02-22 MED ORDER — IBUPROFEN 600 MG PO TABS
600.0000 mg | ORAL_TABLET | Freq: Four times a day (QID) | ORAL | Status: DC
  Administered 2021-02-23 – 2021-02-24 (×3): 600 mg via ORAL
  Filled 2021-02-22 (×3): qty 1

## 2021-02-22 MED ORDER — CEFAZOLIN SODIUM-DEXTROSE 2-3 GM-%(50ML) IV SOLR
INTRAVENOUS | Status: DC | PRN
  Administered 2021-02-22: 2 g via INTRAVENOUS

## 2021-02-22 MED ORDER — OXYTOCIN-SODIUM CHLORIDE 30-0.9 UT/500ML-% IV SOLN
2.5000 [IU]/h | INTRAVENOUS | Status: AC
  Administered 2021-02-23: 2.5 [IU]/h via INTRAVENOUS
  Filled 2021-02-22: qty 500

## 2021-02-22 MED ORDER — OXYTOCIN-SODIUM CHLORIDE 30-0.9 UT/500ML-% IV SOLN
INTRAVENOUS | Status: AC
  Filled 2021-02-22: qty 500

## 2021-02-22 MED ORDER — ROCURONIUM BROMIDE 10 MG/ML (PF) SYRINGE
PREFILLED_SYRINGE | INTRAVENOUS | Status: AC
  Filled 2021-02-22: qty 10

## 2021-02-22 MED ORDER — FENTANYL CITRATE (PF) 100 MCG/2ML IJ SOLN
50.0000 ug | INTRAMUSCULAR | Status: DC | PRN
  Filled 2021-02-22: qty 2

## 2021-02-22 MED ORDER — TERBUTALINE SULFATE 1 MG/ML IJ SOLN
0.2500 mg | Freq: Once | INTRAMUSCULAR | Status: AC | PRN
  Administered 2021-02-22: 0.25 mg via SUBCUTANEOUS
  Filled 2021-02-22: qty 1

## 2021-02-22 MED ORDER — KETOROLAC TROMETHAMINE 30 MG/ML IJ SOLN
INTRAMUSCULAR | Status: AC
  Filled 2021-02-22: qty 1

## 2021-02-22 MED ORDER — SENNOSIDES-DOCUSATE SODIUM 8.6-50 MG PO TABS
2.0000 | ORAL_TABLET | Freq: Every day | ORAL | Status: DC
  Administered 2021-02-23 – 2021-02-24 (×2): 2 via ORAL
  Filled 2021-02-22 (×2): qty 2

## 2021-02-22 MED ORDER — OXYTOCIN-SODIUM CHLORIDE 30-0.9 UT/500ML-% IV SOLN
INTRAVENOUS | Status: DC | PRN
  Administered 2021-02-22: 400 mL via INTRAVENOUS

## 2021-02-22 MED ORDER — KETOROLAC TROMETHAMINE 30 MG/ML IJ SOLN
30.0000 mg | Freq: Four times a day (QID) | INTRAMUSCULAR | Status: AC
  Administered 2021-02-23 (×3): 30 mg via INTRAVENOUS
  Filled 2021-02-22 (×3): qty 1

## 2021-02-22 MED ORDER — OXYCODONE-ACETAMINOPHEN 5-325 MG PO TABS: 2.0000 | ORAL_TABLET | ORAL | Status: DC | PRN

## 2021-02-22 MED ORDER — ONDANSETRON HCL 4 MG/2ML IJ SOLN
4.0000 mg | Freq: Four times a day (QID) | INTRAMUSCULAR | Status: DC | PRN
  Administered 2021-02-22 (×2): 4 mg via INTRAVENOUS
  Filled 2021-02-22: qty 2

## 2021-02-22 MED ORDER — COCONUT OIL OIL: 1.0000 "application " | TOPICAL_OIL | Status: DC | PRN

## 2021-02-22 MED ORDER — STERILE WATER FOR IRRIGATION IR SOLN
Status: DC | PRN
  Administered 2021-02-22: 1000 mL

## 2021-02-22 MED ORDER — SUCCINYLCHOLINE CHLORIDE 200 MG/10ML IV SOSY
PREFILLED_SYRINGE | INTRAVENOUS | Status: DC | PRN
  Administered 2021-02-22: 120 mg via INTRAVENOUS

## 2021-02-22 MED ORDER — PROPOFOL 10 MG/ML IV BOLUS
INTRAVENOUS | Status: AC
  Filled 2021-02-22: qty 20

## 2021-02-22 MED ORDER — WITCH HAZEL-GLYCERIN EX PADS: 1.0000 "application " | MEDICATED_PAD | CUTANEOUS | Status: DC | PRN

## 2021-02-22 MED ORDER — PROPOFOL 10 MG/ML IV BOLUS
INTRAVENOUS | Status: DC | PRN
  Administered 2021-02-22: 50 mg via INTRAVENOUS
  Administered 2021-02-22: 30 mg via INTRAVENOUS
  Administered 2021-02-22: 200 mg via INTRAVENOUS

## 2021-02-22 MED ORDER — LACTATED RINGERS IV SOLN: INTRAVENOUS | Status: DC | PRN

## 2021-02-22 MED ORDER — KETOROLAC TROMETHAMINE 30 MG/ML IJ SOLN
30.0000 mg | Freq: Once | INTRAMUSCULAR | Status: AC
  Administered 2021-02-22: 30 mg via INTRAVENOUS

## 2021-02-22 MED ORDER — DEXAMETHASONE SODIUM PHOSPHATE 10 MG/ML IJ SOLN
INTRAMUSCULAR | Status: DC | PRN
Start: 2021-02-22 — End: 2021-02-22
  Administered 2021-02-22: 10 mg via INTRAVENOUS

## 2021-02-22 MED ORDER — SIMETHICONE 80 MG PO CHEW: 80.0000 mg | CHEWABLE_TABLET | ORAL | Status: DC | PRN

## 2021-02-22 MED ORDER — LIDOCAINE HCL (PF) 1 % IJ SOLN: 30.0000 mL | INTRAMUSCULAR | Status: DC | PRN

## 2021-02-22 MED ORDER — PROMETHAZINE HCL 25 MG/ML IJ SOLN: 6.2500 mg | INTRAMUSCULAR | Status: DC | PRN

## 2021-02-22 MED ORDER — SUGAMMADEX SODIUM 200 MG/2ML IV SOLN
INTRAVENOUS | Status: DC | PRN
  Administered 2021-02-22: 200 mg via INTRAVENOUS

## 2021-02-22 MED ORDER — TETANUS-DIPHTH-ACELL PERTUSSIS 5-2.5-18.5 LF-MCG/0.5 IM SUSY: 0.5000 mL | PREFILLED_SYRINGE | Freq: Once | INTRAMUSCULAR | Status: DC

## 2021-02-22 MED ORDER — ACETAMINOPHEN 325 MG PO TABS: 650.0000 mg | ORAL_TABLET | ORAL | Status: DC | PRN

## 2021-02-22 MED ORDER — ACETAMINOPHEN 10 MG/ML IV SOLN
INTRAVENOUS | Status: DC | PRN
  Administered 2021-02-22: 1000 mg via INTRAVENOUS

## 2021-02-22 MED ORDER — OXYTOCIN BOLUS FROM INFUSION: 333.0000 mL | Freq: Once | INTRAVENOUS | Status: DC

## 2021-02-22 MED ORDER — ZOLPIDEM TARTRATE 5 MG PO TABS: 5.0000 mg | ORAL_TABLET | Freq: Every evening | ORAL | Status: DC | PRN

## 2021-02-22 SURGICAL SUPPLY — 35 items
APL SKNCLS STERI-STRIP NONHPOA (GAUZE/BANDAGES/DRESSINGS) ×1
BARRIER ADHS 3X4 INTERCEED (GAUZE/BANDAGES/DRESSINGS) ×1 IMPLANT
BENZOIN TINCTURE PRP APPL 2/3 (GAUZE/BANDAGES/DRESSINGS) ×1 IMPLANT
BRR ADH 4X3 ABS CNTRL BYND (GAUZE/BANDAGES/DRESSINGS) ×1
CHLORAPREP W/TINT 26ML (MISCELLANEOUS) ×2 IMPLANT
CLAMP CORD UMBIL (MISCELLANEOUS) IMPLANT
DRSG OPSITE POSTOP 4X10 (GAUZE/BANDAGES/DRESSINGS) ×2 IMPLANT
ELECT REM PT RETURN 9FT ADLT (ELECTROSURGICAL) ×2
ELECTRODE REM PT RTRN 9FT ADLT (ELECTROSURGICAL) ×1 IMPLANT
EXTRACTOR VACUUM M CUP 4 TUBE (SUCTIONS) IMPLANT
GLOVE BIOGEL PI IND STRL 6.5 (GLOVE) ×1 IMPLANT
GLOVE BIOGEL PI IND STRL 7.0 (GLOVE) ×1 IMPLANT
GLOVE BIOGEL PI INDICATOR 6.5 (GLOVE) ×1
GLOVE BIOGEL PI INDICATOR 7.0 (GLOVE) ×1
GLOVE SURG SS PI 6.5 STRL IVOR (GLOVE) ×2 IMPLANT
GOWN STRL REUS W/TWL LRG LVL3 (GOWN DISPOSABLE) ×4 IMPLANT
KIT ABG SYR 3ML LUER SLIP (SYRINGE) IMPLANT
NDL HYPO 25X5/8 SAFETYGLIDE (NEEDLE) IMPLANT
NEEDLE HYPO 25X5/8 SAFETYGLIDE (NEEDLE) IMPLANT
NS IRRIG 1000ML POUR BTL (IV SOLUTION) ×2 IMPLANT
PACK C SECTION WH (CUSTOM PROCEDURE TRAY) ×2 IMPLANT
PAD OB MATERNITY 4.3X12.25 (PERSONAL CARE ITEMS) ×2 IMPLANT
PENCIL SMOKE EVAC W/HOLSTER (ELECTROSURGICAL) ×2 IMPLANT
RTRCTR C-SECT PINK 25CM LRG (MISCELLANEOUS) IMPLANT
STRIP CLOSURE SKIN 1/2X4 (GAUZE/BANDAGES/DRESSINGS) ×2 IMPLANT
SUT PLAIN 0 NONE (SUTURE) IMPLANT
SUT VIC AB 0 CT1 36 (SUTURE) ×6 IMPLANT
SUT VIC AB 0 CTX 36 (SUTURE) ×6
SUT VIC AB 0 CTX36XBRD ANBCTRL (SUTURE) IMPLANT
SUT VIC AB 2-0 CT1 27 (SUTURE) ×2
SUT VIC AB 2-0 CT1 TAPERPNT 27 (SUTURE) IMPLANT
SUT VIC AB 4-0 KS 27 (SUTURE) ×2 IMPLANT
TOWEL OR 17X24 6PK STRL BLUE (TOWEL DISPOSABLE) ×2 IMPLANT
TRAY FOLEY W/BAG SLVR 14FR LF (SET/KITS/TRAYS/PACK) ×2 IMPLANT
WATER STERILE IRR 1000ML POUR (IV SOLUTION) ×2 IMPLANT

## 2021-02-22 NOTE — MAU Provider Note (Signed)
History   [id number]   Chief Complaint  Patient presents with   Rupture of Membranes    HPI Carla Henderson is a 30 y.o. female  G4P1021 @39 .0 wks here with report of LOF.  Leaking of fluid has continued. Pt reports no contractions. She denies vaginal bleeding. She reports +fetal movement. Hx of HSV, has been taking Valtrex, no outbreak since prior to pregnancy. Denies recent or current sx. All other systems negative.    Patient's last menstrual period was 05/25/2020.  OB History  Gravida Para Term Preterm AB Living  4 1 1   2 1   SAB IAB Ectopic Multiple Live Births  2     0 1    # Outcome Date GA Lbr Len/2nd Weight Sex Delivery Anes PTL Lv  4 Current           3 Term 03/19/18 37w5d17:28 / 02:52 4054 g M Vag-Spont EPI  LIV  2 SAB 2017             Birth Comments: twin gestation, a- SAB @ 19 weeks, B-SAB @21  weeks  1 SAB 2014 838w0d          [facility]    Past Medical History:  Diagnosis Date   Abnormal Pap smear    Anemia    Bronchitis    x 1 time   Gestational diabetes    HSV (herpes simplex virus) anogenital infection    Infertility, female    Ovarian cyst    PCOS (polycystic ovarian syndrome)    Spinal headache    UTI (lower urinary tract infection)    Vaginal Pap smear, abnormal     Family History  Problem Relation Age of Onset   Hypertension Mother    Healthy Father    Hypertension Paternal Grandmother    Diabetes Paternal Grandfather    Other Neg Hx     Social History   Socioeconomic History   Marital status: Single    Spouse name: Not on file   Number of children: Not on file   Years of education: Not on file   Highest education level: Not on file  Occupational History   Not on file  Tobacco Use   Smoking status: Never   Smokeless tobacco: Never  Vaping Use   Vaping Use: Never used  Substance and Sexual Activity   Alcohol use: Not Currently    Comment: not while rpeg   Drug use: No   Sexual  activity: Not Currently    Birth control/protection: None    Comment: approx [redacted] wks gestation  Other Topics Concern   Not on file  Social History Narrative   Not on file   Social Determinants of Health   Financial Resource Strain: Not on file  Food Insecurity: Not on file  Transportation Needs: Not on file  Physical Activity: Not on file  Stress: Not on file  Social Connections: Not on file    Allergies  Allergen Reactions   Vicodin [Hydrocodone-Acetaminophen] Nausea And Vomiting and Other (See Comments)    Pt states does not cause swelling    No current facility-administered medications on file prior to encounter.   Current Outpatient Medications on File Prior to Encounter  Medication Sig Dispense Refill   Prenatal Vit-Fe Fumarate-FA (PRENATAL MULTIVITAMIN) TABS tablet Take 1 tablet by mouth daily at 12 noon.     valACYclovir (VALTREX) 1000 MG tablet Take 1,000 mg by mouth 2 (  two) times daily as needed (outbreaks).     Cholecalciferol (VITAMIN D) 50 MCG (2000 UT) CAPS Take 2,000 Units by mouth daily.     hydroxyprogesterone caproate (MAKENA) 250 mg/mL OIL injection Inject 250 mg into the muscle once a week. (Patient not taking: Reported on 10/08/2020)     magnesium oxide (MAG-OX) 400 MG tablet Take 400 mg by mouth daily.       Review of Systems  Gastrointestinal:  Negative for abdominal pain.  Genitourinary:  Positive for vaginal discharge.    Physical Exam   Vitals:   02/22/21 1456 02/22/21 1501  BP: (!) 140/92 (!) 141/88  Pulse: 98 88  Resp: 19   Temp: 98.4 F (36.9 C)   TempSrc: Oral   SpO2: 99% 99%    Physical Exam Vitals and nursing note reviewed. Exam conducted with a chaperone present.  Constitutional:      General: She is not in acute distress.    Appearance: Normal appearance.  HENT:     Head: Normocephalic and atraumatic.  Cardiovascular:     Rate and Rhythm: Normal rate.  Pulmonary:     Effort: Pulmonary effort is normal. No respiratory  distress.  Genitourinary:    Comments: SSE: no external or internal lesions; +pool, light MSF SVE: closed/thick/soft Musculoskeletal:        General: Normal range of motion.     Cervical back: Normal range of motion.  Skin:    General: Skin is warm and dry.  Neurological:     General: No focal deficit present.     Mental Status: She is alert and oriented to person, place, and time.  Psychiatric:        Mood and Affect: Mood normal.        Behavior: Behavior normal.  EFM: 145 bpm, mod variability, + accels, no decels Toco: UI  Results for orders placed or performed during the hospital encounter of 02/22/21 (from the past 24 hour(s))  POCT fern test     Status: None   Collection Time: 02/22/21  3:07 PM  Result Value Ref Range   POCT Fern Test Positive = ruptured amniotic membanes    MAU Course  Procedures  MDM Speculum exam performed per Dr. Phillips Hay request to evaluate for herpes lesions, non visualized. Plan for admit.  Assessment and Plan  [redacted] weeks gestation Reactive NST PROM at term Admit to LD Mng[facility] per Dr. Mindi Curling, [place], CNM 02/22/2021 3:21 PM

## 2021-02-22 NOTE — Anesthesia Procedure Notes (Signed)
Procedure Name: Intubation Date/Time: 02/22/2021 8:48 PM Performed by: Brennan Bailey, MD Pre-anesthesia Checklist: Patient identified, Emergency Drugs available, Suction available and Patient being monitored Patient Re-evaluated:Patient Re-evaluated prior to induction Oxygen Delivery Method: Circle System Utilized Preoxygenation: Pre-oxygenation with 100% oxygen Induction Type: IV induction and Rapid sequence Laryngoscope Size: Glidescope and 3 Tube type: Oral Tube size: 7.0 mm Number of attempts: 1 Airway Equipment and Method: Video-laryngoscopy and Rigid stylet Placement Confirmation: ETT inserted through vocal cords under direct vision, positive ETCO2 and breath sounds checked- equal and bilateral Secured at: 22 cm Tube secured with: Tape Dental Injury: Teeth and Oropharynx as per pre-operative assessment

## 2021-02-22 NOTE — Anesthesia Postprocedure Evaluation (Signed)
Anesthesia Post Note  Patient: Carla Henderson  Procedure(s) Performed: CESAREAN SECTION     Patient location during evaluation: PACU Anesthesia Type: General Level of consciousness: awake and alert and oriented Pain management: pain level controlled Vital Signs Assessment: post-procedure vital signs reviewed and stable Respiratory status: spontaneous breathing, nonlabored ventilation and respiratory function stable Cardiovascular status: blood pressure returned to baseline Postop Assessment: no apparent nausea or vomiting Anesthetic complications: no   No notable events documented.  Last Vitals:  Vitals:   02/22/21 2239 02/22/21 2245  BP:  130/81  Pulse:  98  Resp:  13  Temp:    SpO2: 96% 97%    Last Pain:  Vitals:   02/22/21 2239  TempSrc:   PainSc: 5    Pain Goal:    LLE Motor Response: Purposeful movement (02/22/21 2245) LLE Sensation: Full sensation (02/22/21 2245) RLE Motor Response: Purposeful movement (02/22/21 2245) RLE Sensation: Full sensation (02/22/21 2245)     Epidural/Spinal Function Cutaneous sensation: Normal sensation (02/22/21 2245), Patient able to flex knees: Yes (02/22/21 2245), Patient able to lift hips off bed: No (02/22/21 2245), Back pain beyond tenderness at insertion site: No (02/22/21 2245), Progressively worsening motor and/or sensory loss: No (02/22/21 2245), Bowel and/or bladder incontinence post epidural: No (02/22/21 2245)  Marthenia Rolling

## 2021-02-22 NOTE — MAU Note (Signed)
...  Carla Henderson is a 30 y.o. at 22w0dhere in MAU reporting: Water broke at 0630 this morning. Clear/yellow fluids. Denies any CTX. GBS-. +FM.  Cervical cerclage removed two weeks ago. GDM. HSV, taking Valtrex daily.  Pain score: 0/10  FHT: 140 initial Lab orders placed from triage:  MAU Labor Eval

## 2021-02-22 NOTE — Op Note (Signed)
Cesarean Section Procedure Note  Indications: non-reassuring fetal status  Pre-operative Diagnosis: 39 week 0 day pregnancy.  Post-operative Diagnosis: same  Surgeon: Christophe Louis M.D   Assistants: Burman Foster CNM  Anesthesia: General endotracheal anesthesia  ASA Class: 2   Procedure Details     Upon my arrival to the OR. Dr. Mora Bellman had delivered the female infant and had closed the hysterotomy incision. Please see separate operative report.  Hemostasis was observed. Lavage was carried out until clear. The fascia was then reapproximated with running sutures of  0 vicryl  . The skin was reapproximated with  4-0 vicryl .  Instrument, sponge, and needle counts were correct prior the abdominal closure and at the conclusion of the case.   Findings: Normal fallopian tubes and ovaries.   Estimated Blood Loss:   per anesthesia          Drains: None         Total IV Fluids:  per anesthesia ml         Specimens: Placenta and Disposition:  Sent to Pathology          Implants: None         Complications:  None; patient tolerated the procedure well.         Disposition: PACU - hemodynamically stable.         Condition: stable

## 2021-02-22 NOTE — MAU Note (Signed)
Pelvic exam performed by Julianne Handler, CNM. No external or internal lesions examined.

## 2021-02-22 NOTE — Anesthesia Preprocedure Evaluation (Signed)
Anesthesia Evaluation  Patient identified by MRN, date of birth, ID band Patient awake    Reviewed: Allergy & Precautions, NPO status , Patient's Chart, lab work & pertinent test results  History of Anesthesia Complications (+) POST - OP SPINAL HEADACHE and history of anesthetic complications  Airway Mallampati: II  TM Distance: >3 FB Neck ROM: Full    Dental no notable dental hx.    Pulmonary neg pulmonary ROS,    Pulmonary exam normal        Cardiovascular negative cardio ROS Normal cardiovascular exam     Neuro/Psych negative neurological ROS  negative psych ROS   GI/Hepatic negative GI ROS, Neg liver ROS,   Endo/Other  diabetes, Gestational  Renal/GU negative Renal ROS  negative genitourinary   Musculoskeletal negative musculoskeletal ROS (+)   Abdominal   Peds  Hematology  (+) anemia , Hgb 11.4   Anesthesia Other Findings Day of surgery medications reviewed with patient.  Reproductive/Obstetrics (+) Pregnancy                             Anesthesia Physical Anesthesia Plan  ASA: 2 and emergent  Anesthesia Plan: General   Post-op Pain Management:    Induction: Intravenous and Rapid sequence  PONV Risk Score and Plan: 4 or greater and Treatment may vary due to age or medical condition, Ondansetron, Dexamethasone and Midazolam  Airway Management Planned: Oral ETT and Video Laryngoscope Planned  Additional Equipment: None  Intra-op Plan:   Post-operative Plan: Extubation in OR  Informed Consent: I have reviewed the patients History and Physical, chart, labs and discussed the procedure including the risks, benefits and alternatives for the proposed anesthesia with the patient or authorized representative who has indicated his/her understanding and acceptance.     Dental advisory given  Plan Discussed with: CRNA  Anesthesia Plan Comments: (Code Cesarean for fetal  bradycardia. Patient does not have epidural in place, plan for GETA. Consent obtained in expeditious fashion due to emergent procedure. Patient ate a meal 1 hour ago.  Daiva Huge, MD)        Anesthesia Quick Evaluation

## 2021-02-22 NOTE — Op Note (Signed)
Carla Henderson PROCEDURE DATE: 02/22/2021  PREOPERATIVE DIAGNOSIS: Intrauterine pregnancy at  7w0dweeks gestation; non-reassuring fetal status  POSTOPERATIVE DIAGNOSIS: The same  PROCEDURE:     Cesarean Section  SURGEON:  Dr. PMora Bellman ASSISTANT: Dr. RMarvel Plan INDICATIONS: Carla NORDMANNis a 30y.o. [id number] 389w0dcheduled for cesarean section secondary to non-reassuring fetal status.  Called to room to evaluate patient with 10 minute prolonged deceleration. 6 cm cervical dilation with no response to scalp stimulation. IV bolus given and patient repositioned. Decision made to proceed with delivery via c-section. The risks of cesarean section discussed with the patient included but were not limited to: bleeding which may require transfusion or reoperation; infection which may require antibiotics; injury to bowel, bladder, ureters or other surrounding organs; injury to the fetus; need for additional procedures including hysterectomy in the event of a life-threatening hemorrhage; placental abnormalities wth subsequent pregnancies, incisional problems, thromboembolic phenomenon and other postoperative/anesthesia complications. The patient concurred with the proposed plan, giving informed written consent for the procedure.    FINDINGS:  Viable female infant in cephalic presentation.  Apgars 7 and 9, weight, not available at the time of the note.  Clear amniotic fluid.  Intact placenta, three vessel cord.  Normal uterus, fallopian tubes and ovaries bilaterally.  ANESTHESIA:    General SPECIMENS: Placenta sent to pathology COMPLICATIONS: None immediate  PROCEDURE IN DETAIL:  The patient received intravenous antibiotics and had sequential compression devices applied to her lower extremities while in the preoperative area.  She was then taken to the operating room where anesthesia was induced and was found to be adequate. A foley catheter was placed into her bladder and attached to  Ramel Tobon gravity. She was then placed in a dorsal supine position with a leftward tilt, and prepped and draped in a sterile manner. After an adequate timeout was performed, a Pfannenstiel skin incision was made with scalpel and carried through to the underlying layer of fascia. The fascia was incised in the midline and this incision was extended bilaterally using the Mayo scissors. Kocher clamps were applied to the superior aspect of the fascial incision and the underlying rectus muscles were dissected off bluntly. A similar process was carried out on the inferior aspect of the facial incision. The rectus muscles were separated in the midline bluntly and the peritoneum was entered bluntly. The Alexis self-retaining retractor was introduced into the abdominal cavity. Attention was turned to the lower uterine segment where a transverse hysterotomy was made with a scalpel and extended bilaterally bluntly. The infant was successfully delivered, and cord was clamped and cut and infant was handed over to awaiting neonatology team. Uterine massage was then administered and the placenta delivered intact with three-vessel cord. The uterus was cleared of clot and debris.  The hysterotomy was closed with 0 Vicryl in a running locked fashion, and an imbricating layer was also placed with a 0 Vicryl. Overall, excellent hemostasis was noted. Rest of the surgery completed by Dr. CoLandry Mellownd her assistant. See separate operative note   Carla Daigler ConstantMD  02/22/2021 9:08 PM

## 2021-02-22 NOTE — Progress Notes (Signed)
RN received call that patient requesting pain medication. RN entered room to see that patient was visibly uncomfortable, reporting nausea and increased pain. Patient vocalizing that pain nearly constant and guarding abdomen. Patient requested medication for nausea and pain relief. Patient had one episode of large emesis. Monitors applied to abdomen when patient returned to bed, with heart tones in the 90s. Pulse ox applied to patient's finger. Multiple position changes attempted to resolve FHR without success. Providers notified. Patient prepped and moved to OR. [facility], RN

## 2021-02-22 NOTE — Transfer of Care (Signed)
Immediate Anesthesia Transfer of Care Note  Patient: Carla Henderson  Procedure(s) Performed: CESAREAN SECTION  Patient Location: PACU  Anesthesia Type:General  Level of Consciousness: awake, alert , oriented and patient cooperative  Airway & Oxygen Therapy: Patient Spontanous Breathing and Patient connected to nasal cannula oxygen  Post-op Assessment: Report given to RN, Post -op Vital signs reviewed and stable and Patient moving all extremities X 4  Post vital signs: Reviewed and stable  Last Vitals:  Vitals Value Taken Time  BP 102/87 02/22/21 2203  Temp    Pulse 123 02/22/21 2209  Resp 23 02/22/21 2209  SpO2 98 % 02/22/21 2209  Vitals shown include unvalidated device data.  Last Pain:  Vitals:   02/22/21 1930  TempSrc:   PainSc: 0-No pain         Complications: No notable events documented.

## 2021-02-22 NOTE — H&P (Signed)
Carla Henderson is a 30 y.o. female presenting for leaking of fluid started this morning at 06:30. Patient denies painful contractions, no vaginal bleeding, reports good fetal movement.   Prenatal care at [facility] complicated by: -Cervical insufficiency - s/p cerclage this pregnancy (removed at 59 weeks) -Vitamin D deficiency -A1GDM -Vanishing Twin Syndrome -HSV 2  -PUPPS  OB History     Gravida  4   Para  1   Term  1   Preterm      AB  2   Living  1      SAB  2   IAB      Ectopic      Multiple  0   Live Births  1          Past Medical History:  Diagnosis Date   Abnormal Pap smear    Anemia    Bronchitis    x 1 time   Gestational diabetes    HSV (herpes simplex virus) anogenital infection    Infertility, female    Ovarian cyst    PCOS (polycystic ovarian syndrome)    Spinal headache    UTI (lower urinary tract infection)    Vaginal Pap smear, abnormal    Past Surgical History:  Procedure Laterality Date   CERVICAL CERCLAGE     CERVICAL CERCLAGE N/A 09/13/2017   Procedure: CERCLAGE CERVICAL;  Surgeon: Crawford Givens, MD;  Location: [facility];  Service: Gynecology;  Laterality: N/A;   CERVICAL CERCLAGE N/A 09/08/2020   Procedure: CERCLAGE CERVICAL;  Surgeon: Crawford Givens, MD;  Location: [facility];  Service: Gynecology;  Laterality: N/A;   CHOLECYSTECTOMY     LAPAROSCOPIC CHOLECYSTECTOMY SINGLE SITE WITH INTRAOPERATIVE CHOLANGIOGRAM N/A 03/01/2017   Procedure: LAPAROSCOPIC CHOLECYSTECTOMY SINGLE SITE WITH INTRAOPERATIVE CHOLANGIOGRAM;  Surgeon: Michael Boston, MD;  Location: [facility];  Service: General;  Laterality: N/A;   LAPAROSCOPIC OVARIAN CYSTECTOMY Left 01/07/2015   Procedure: Attempted LAPAROSCOPIC OVARIAN CYSTECTOMY, Mini laparotomy with excision of left ovarian cyst;  Surgeon: Mora Bellman, MD;  Location: [facility];  Service: Gynecology;  Laterality: Left;  Requested 01-26-15 @ 3:30p   WISDOM TOOTH EXTRACTION     Family History: family history includes  Diabetes in her paternal grandfather; Healthy in her father; Hypertension in her mother and paternal grandmother. Social History:  reports that she has never smoked. She has never used smokeless tobacco. She reports that she does not currently use alcohol. She reports that she does not use drugs.     Maternal Diabetes: Yes:  Diabetes Type:  Diet controlled Genetic Screening: Normal Maternal Ultrasounds/Referrals: Normal Fetal Ultrasounds or other Referrals:  None Maternal Substance Abuse:  No Significant Maternal Medications:  Meds include: Other: Valtrex, PNV, Vit D Significant Maternal Lab Results:  Group B Strep negative Other Comments:  None  Review of Systems  Respiratory:  Negative for cough and shortness of breath.   Gastrointestinal:  Negative for abdominal pain.  All other systems reviewed and are negative. Maternal Medical History:  Reason for admission: Rupture of membranes.   Contractions: Frequency: rare.   Perceived severity is mild.   Fetal activity: Perceived fetal activity is normal.   Last perceived fetal movement was within the past 12 hours.   Prenatal Complications - Diabetes: type 1. Diabetes is managed by diet.     Maternal Exam:  Uterine Assessment: Contraction strength is mild.  Contraction frequency is rare.  Abdomen: Patient reports no abdominal tenderness. Fundal height is 39.   Estimated fetal weight is  3000 grams.   Fetal presentation: vertex Introitus: Normal vulva. Vulva is negative for lesion.  Normal vagina.  Ferning test: positive.  Nitrazine test: positive. Amniotic fluid character: clear. Pelvis: adequate for delivery.   Cervix: Cervix evaluated by sterile speculum exam and digital exam.     Fetal Exam Fetal Monitor Review: Baseline rate: 150.  Variability: moderate (6-25 bpm).   Pattern: accelerations present and no decelerations.   Fetal State Assessment: Category I - tracings are normal.  Physical Exam Vitals and nursing note  reviewed.  Genitourinary:    General: Normal vulva.  Vulva is no lesion.   Dilation: Closed Exam by:: Julianne Handler, CNM Blood pressure 138/85, pulse 88, temperature 98.4 F (36.9 C), temperature source Oral, resp. rate 19, last menstrual period 05/25/2020, SpO2 99 %, currently breastfeeding.  Prenatal labs: ABO, Rh: --/--/A POS (05/18 0017) Antibody: NEG (05/18 0741) Rubella: Immune (04/05 0000) RPR: Nonreactive (04/05 0000)  HBsAg: Negative (04/05 0000)  HIV: Non-reactive (04/05 0000)  GBS: Negative/-- (10/11 0000)   Assessment/Plan: 30 year old G3P1021 at 38 weeks 6 days s/p PROM Admit to Labor and delivery A1GDM controlled on diet so insulin not needed in labor Continuous monitoring Epidural on demand Blood pressures elevated in MAU so will check Pre E labs along with admission labs Anticipate NSVD   Sanjuana Kava 02/22/2021, 3:37 PM

## 2021-02-23 ENCOUNTER — Encounter (HOSPITAL_COMMUNITY): Payer: Self-pay | Admitting: Obstetrics & Gynecology

## 2021-02-23 ENCOUNTER — Inpatient Hospital Stay (HOSPITAL_COMMUNITY)
Admission: AD | Admit: 2021-02-23 | Payer: Medicaid Other | Source: Home / Self Care | Admitting: Obstetrics & Gynecology

## 2021-02-23 ENCOUNTER — Inpatient Hospital Stay (HOSPITAL_COMMUNITY): Payer: Medicaid Other

## 2021-02-23 HISTORY — DX: Gestational diabetes mellitus in pregnancy, unspecified control: O24.419

## 2021-02-23 LAB — CBC
HCT: 28.2 % — ABNORMAL LOW (ref 36.0–46.0)
Hemoglobin: 9.5 g/dL — ABNORMAL LOW (ref 12.0–15.0)
MCH: 30.8 pg (ref 26.0–34.0)
MCHC: 33.7 g/dL (ref 30.0–36.0)
MCV: 91.6 fL (ref 80.0–100.0)
Platelets: 166 10*3/uL (ref 150–400)
RBC: 3.08 MIL/uL — ABNORMAL LOW (ref 3.87–5.11)
RDW: 13.2 % (ref 11.5–15.5)
WBC: 10.3 10*3/uL (ref 4.0–10.5)
nRBC: 0 % (ref 0.0–0.2)

## 2021-02-23 LAB — GLUCOSE, CAPILLARY: Glucose-Capillary: 118 mg/dL — ABNORMAL HIGH (ref 70–99)

## 2021-02-23 LAB — RPR: RPR Ser Ql: NONREACTIVE

## 2021-02-23 MED ORDER — ACETAMINOPHEN 500 MG PO TABS
1000.0000 mg | ORAL_TABLET | Freq: Three times a day (TID) | ORAL | Status: DC
  Administered 2021-02-23 – 2021-02-24 (×3): 1000 mg via ORAL
  Filled 2021-02-23 (×3): qty 2

## 2021-02-23 MED ORDER — POLYSACCHARIDE IRON COMPLEX 150 MG PO CAPS
150.0000 mg | ORAL_CAPSULE | Freq: Every day | ORAL | Status: DC
  Administered 2021-02-23 – 2021-02-24 (×2): 150 mg via ORAL
  Filled 2021-02-23 (×2): qty 1

## 2021-02-23 NOTE — Progress Notes (Signed)
Blood sugar this AM is 118. Per patient at around 0330, she had finished 3 packs of saltine crackers.

## 2021-02-23 NOTE — Progress Notes (Signed)
Spoke with Burman Foster, CNM regarding patient bleeding with fundal rubs. On patients 1 hour out check, she gushed a significant amount 3 times with fundal rub and then it stopped. This RN would consider the bleeding as moderate amount at the time. On her next check, patient did not gush initially but did once a few minutes after complaining of contraction/cramping pain. The pad was weighed and it was 184 ml. Output in foley was 900 ml. Patient was treated at [facility] with 0.2 mg dilaudid for contraction-like pain. CNM aware of all the above. No new orders at this time. Will continue to monitor.

## 2021-02-23 NOTE — Lactation Note (Signed)
This note was copied from a baby's chart. Lactation Consultation Note  Patient Name: Carla Henderson Date: 02/23/2021 Reason for consult: Initial assessment;Maternal endocrine disorder;Term  PCOS, GDM Age:31 hours  LC in to visit with P2 Mom of term baby.  Mom had baby STS on her breast when RN came in to swaddle baby so Mom could eat breakfast.  Mom reports baby has latched with a wide mouth and comfortable latch.  Mom reports breastfeeding her first baby for 6 months with a good milk supply. DEBP set up at bedside.  Mom states she pumped one breast for 15 mins, no colostrum.   Baby was fussy after breastfeeding and Mom unable to express any colostrum and she chose formula by bottle (15 ml).    Encouraged STS with baby and watching for feeding cues.  Mom to ask for help with latch prn.    Reviewed breast massage and hand expression, unable to express colostrum at this time.  Nipples short and areola compressible.    Lactation brochure left for Mom.   Lactation Tools Discussed/Used Tools: Pump;Flanges;Bottle Flange Size: 24 Breast pump type: Double-Electric Breast Pump Pump Education: Setup, frequency, and cleaning Reason for Pumping: Mom's request Pumping frequency: Mom to pump both breasts if baby is supplemented Pumped volume: 0 mL  Interventions Interventions: Breast feeding basics reviewed;Skin to skin;Breast massage;Hand express;LC Services brochure;DEBP  Consult Status Consult Status: Follow-up Date: 02/24/21 Follow-up type: In-patient    Broadus John 02/23/2021, 8:15 AM

## 2021-02-23 NOTE — Progress Notes (Addendum)
Subjective: POD# 1 Information for the patient's newborn:  Carla Henderson, Carla Henderson [[id number]]  female    Circumcision: requesting inpatient   Reports feeling "pretty good, occasional cramping" Feeding: breast Reports tolerating PO and denies N/V, foley in place, ambulating and due to void Pain controlled with prescription NSAID's including ketorolac (Toradol) and percocet Denies HA/SOB/dizziness  Flatus present Vaginal bleeding is normal, no clots     Objective:  VS:  Vitals:   02/22/21 2338 02/23/21 0049 02/23/21 0149 02/23/21 0322  BP: 121/73 132/73 127/82   Pulse: 94 93 91   Resp: 20 18 16 18   Temp: 99.3 F (37.4 C) 98.4 F (36.9 C) 98.7 F (37.1 C) 98.3 F (36.8 C)  TempSrc: Oral Oral Oral Oral  SpO2: 98% 98% 98% 98%  Weight:      Height:        Intake/Output Summary (Last 24 hours) at 02/23/2021 0817 Last data filed at 02/23/2021 5520 Gross per 24 hour  Intake 1312.28 ml  Output 2709 ml  Net -1396.72 ml     Recent Labs    02/22/21 1532 02/23/21 0523  WBC 4.8 10.3  HGB 11.4* 9.5*  HCT 33.2* 28.2*  PLT 205 166    Blood type: --/--/A POS (11/01 1522) Rubella: Immune (04/05 0000)    Physical Exam:  General: alert, cooperative, and no distress CV: Regular rate and rhythm or without murmur or extra heart sounds Resp: clear Abdomen: soft, nontender, normal bowel sounds Incision: dry, intact, and old bloody drainage present Perineum:  Uterine Fundus: firm, below umbilicus, nontender Lochia: minimal Ext: extremities normal, atraumatic, no cyanosis or edema, Homans sign is negative, no sign of DVT, and no edema, redness or tenderness in the calves or thighs   Assessment/Plan: 30 y.o.   POD# 1. [id number]                  Active Problems:   Overweight   Gestational diabetes mellitus (GDM), antepartum   PROM (premature rupture of membranes) Blood loss anemia - asymptomatic  Routine post-op PP care          Advance diet as tolerated Niferex 111m q  day Advised warm fluids and ambulation to improve GI motility Breastfeeding support Anticipate D/C 02/24/21  KDomingo Pulse MSN, CNM 02/23/2021, 8:17 AM

## 2021-02-24 LAB — SURGICAL PATHOLOGY

## 2021-02-24 MED ORDER — SENNOSIDES-DOCUSATE SODIUM 8.6-50 MG PO TABS: 2.0000 | ORAL_TABLET | Freq: Every evening | ORAL | 3 refills | Status: DC | PRN

## 2021-02-24 MED ORDER — OXYCODONE-ACETAMINOPHEN 5-325 MG PO TABS: 2.0000 | ORAL_TABLET | ORAL | 0 refills | Status: DC | PRN

## 2021-02-24 MED ORDER — IBUPROFEN 600 MG PO TABS: 600.0000 mg | ORAL_TABLET | Freq: Four times a day (QID) | ORAL | 3 refills | Status: DC | PRN

## 2021-02-24 MED ORDER — ACETAMINOPHEN 325 MG/10.15ML PO SUSP: 20.0000 mL | Freq: Four times a day (QID) | ORAL | 3 refills | Status: DC | PRN

## 2021-02-24 NOTE — Discharge Summary (Signed)
Postpartum Discharge Summary  Date of Service updated 02/24/21     Patient Name: Carla Henderson DOB: 11-09-1990 MRN: [id number]  Date of admission: 02/22/2021 Delivery date:02/22/2021  Delivering provider: CONSTANT, PEGGY  Date of discharge: 02/24/2021  Admitting diagnosis: PROM (premature rupture of membranes) [O42.90] Intrauterine pregnancy: [redacted]w[redacted]d    Secondary diagnosis:  Active Problems:   Overweight   Gestational diabetes mellitus (GDM), antepartum   PROM (premature rupture of membranes)  Additional problems: Fetal Bradycardia/non-reassuring fetal heart tracing    Discharge diagnosis: Term Pregnancy Delivered, Gestational Hypertension, and GDM A1                                              Post partum procedures: none Augmentation: Cytotec Complications: Fetal bradycardia leading to a code cesarean  Hospital course: Onset of Labor With Unplanned C/S   30y.o. yo [id number] 327w0das admitted in Latent Labor (s/p PROM) on 02/22/2021. Patient had a labor course significant for administration of one dose of misoprostol for induction. During the evening there was acute episode of fetal bradycardia. The patient went for cesarean section due to Non-Reassuring FHR. Delivery details as follows: Membrane Rupture Time/Date: 6:30 AM ,02/22/2021   Delivery Method:C-Section, Low Transverse  Details of operation can be found in separate operative note. Patient had an uncomplicated postpartum course.  She is ambulating,tolerating a regular diet, passing flatus, and urinating well.  Patient is discharged home in stable condition 02/24/21.  Newborn Data: Birth date:02/22/2021  Birth time:8:49 PM  Gender:Female  Living status:Living  Apgars:7 ,9  Weight:3570 g   Magnesium Sulfate received: No BMZ received: No Rhophylac:No MMR:No T-DaP:Given prenatally Flu: No Transfusion:No  Physical exam  Vitals:   02/23/21 1100 02/23/21 1540 02/23/21 2143 02/24/21 0527  BP: 113/70 117/78 132/79  135/81  Pulse: 77 77 97 89  Resp: 20 18 18 18   Temp: 97.8 F (36.6 C) 98 F (36.7 C) 98.3 F (36.8 C) 98.1 F (36.7 C)  TempSrc: Oral Oral Oral Oral  SpO2: 98% 98% 99% 98%  Weight:      Height:       General: alert, cooperative, and no distress Lochia: appropriate Uterine Fundus: firm Incision: Healing well with no significant drainage, No significant erythema, Dressing is clean, dry, and intact DVT Evaluation: No evidence of DVT seen on physical exam. Negative Homan's sign. No cords or calf tenderness. No significant calf/ankle edema. Labs: Lab Results  Component Value Date   WBC 10.3 02/23/2021   HGB 9.5 (L) 02/23/2021   HCT 28.2 (L) 02/23/2021   MCV 91.6 02/23/2021   PLT 166 02/23/2021   CMP Latest Ref Rng & Units 02/22/2021  Glucose 70 - 99 mg/dL 112(H)  BUN 6 - 20 mg/dL 6  Creatinine 0.44 - 1.00 mg/dL 0.65  Sodium 135 - 145 mmol/L 136  Potassium 3.5 - 5.1 mmol/L 3.6  Chloride 98 - 111 mmol/L 107  CO2 22 - 32 mmol/L 21(L)  Calcium 8.9 - 10.3 mg/dL 8.8(L)  Total Protein 6.5 - 8.1 g/dL 6.1(L)  Total Bilirubin 0.3 - 1.2 mg/dL 0.3  Alkaline Phos 38 - 126 U/L 90  AST 15 - 41 U/L 15  ALT 0 - 44 U/L 17   Edinburgh Score: Edinburgh Postnatal Depression Scale Screening Tool 02/23/2021  I have been able to laugh and see the funny side of things.  0  I have looked forward with enjoyment to things. 0  I have blamed myself unnecessarily when things went wrong. 0  I have been anxious or worried for no good reason. 0  I have felt scared or panicky for no good reason. 0  Things have been getting on top of me. 0  I have been so unhappy that I have had difficulty sleeping. 0  I have felt sad or miserable. 0  I have been so unhappy that I have been crying. 0  The thought of harming myself has occurred to me. 0  Edinburgh Postnatal Depression Scale Total 0      After visit meds:  Allergies as of 02/24/2021       Reactions   Vicodin [hydrocodone-acetaminophen] Nausea And  Vomiting, Other (See Comments)   Pt states does not cause swelling        Medication List     STOP taking these medications    hydroxyprogesterone caproate 250 mg/mL Oil injection Commonly known as: MAKENA   valACYclovir 1000 MG tablet Commonly known as: VALTREX       TAKE these medications    Acetaminophen 325 MG/10.15ML Susp Take 20 mLs (640.3941 mg total) by mouth every 6 (six) hours as needed (moderate pain, do not take with percocet maximum dose 3000 mg in 24 hours).   ibuprofen 600 MG tablet Commonly known as: ADVIL Take 1 tablet (600 mg total) by mouth every 6 (six) hours as needed for cramping or moderate pain.   magnesium oxide 400 MG tablet Commonly known as: MAG-OX Take 400 mg by mouth daily.   oxyCODONE-acetaminophen 5-325 MG tablet Commonly known as: PERCOCET/ROXICET Take 2 tablets by mouth every 4 (four) hours as needed for severe pain (DO NOT TAKE WITH REGULAR TYLENOL).   prenatal multivitamin Tabs tablet Take 1 tablet by mouth daily at 12 noon.   senna-docusate 8.6-50 MG tablet Commonly known as: Senokot-S Take 2 tablets by mouth at bedtime as needed for mild constipation.   Vitamin D 50 MCG (2000 UT) Caps Take 2,000 Units by mouth daily.               Discharge Care Instructions  (From admission, onward)           Start     Ordered   02/24/21 0000  Discharge wound care:       Comments: Keep honeycomb dressing on until seen in the office next week   02/24/21 0935             Discharge home in stable condition Infant Feeding: Bottle and Breast Infant Disposition:home with mother Discharge instruction: per After Visit Summary and Postpartum booklet. Activity: Advance as tolerated. Pelvic rest for 6 weeks.  Diet: routine diet Anticipated Birth Control: Unsure Postpartum Appointment:6 weeks Additional Postpartum F/U: Incision check 1 week and BP check 1 week Future Appointments:Call [facility] to make  appointments Follow up Visit:      02/24/2021 Sanjuana Kava, MD

## 2021-03-09 ENCOUNTER — Telehealth (HOSPITAL_COMMUNITY): Payer: Self-pay | Admitting: *Deleted

## 2021-03-09 NOTE — Telephone Encounter (Signed)
Left message to return nurse call.  Odis Hollingshead, RN 03-09-2021 at 2:35pm

## 2021-05-06 ENCOUNTER — Emergency Department (HOSPITAL_BASED_OUTPATIENT_CLINIC_OR_DEPARTMENT_OTHER)
Admission: EM | Admit: 2021-05-06 | Discharge: 2021-05-06 | Disposition: A | Discharge status: Home / Self Care | Payer: Medicaid Other | Attending: Emergency Medicine | Admitting: Emergency Medicine

## 2021-05-06 ENCOUNTER — Encounter (HOSPITAL_BASED_OUTPATIENT_CLINIC_OR_DEPARTMENT_OTHER): Payer: Self-pay | Admitting: Emergency Medicine

## 2021-05-06 ENCOUNTER — Ambulatory Visit
Admission: EM | Admit: 2021-05-06 | Discharge: 2021-05-06 | Disposition: A | Discharge status: Home / Self Care | Payer: Medicaid Other

## 2021-05-06 ENCOUNTER — Other Ambulatory Visit: Payer: Self-pay

## 2021-05-06 DIAGNOSIS — R000 Tachycardia, unspecified: Secondary | ICD-10-CM | POA: Diagnosis not present

## 2021-05-06 DIAGNOSIS — L02416 Cutaneous abscess of left lower limb: Secondary | ICD-10-CM | POA: Diagnosis not present

## 2021-05-06 DIAGNOSIS — L03116 Cellulitis of left lower limb: Secondary | ICD-10-CM | POA: Diagnosis present

## 2021-05-06 DIAGNOSIS — L02214 Cutaneous abscess of groin: Secondary | ICD-10-CM | POA: Diagnosis not present

## 2021-05-06 DIAGNOSIS — L089 Local infection of the skin and subcutaneous tissue, unspecified: Secondary | ICD-10-CM

## 2021-05-06 LAB — BASIC METABOLIC PANEL
Anion gap: 9 (ref 5–15)
BUN: 10 mg/dL (ref 6–20)
CO2: 27 mmol/L (ref 22–32)
Calcium: 9.1 mg/dL (ref 8.9–10.3)
Chloride: 102 mmol/L (ref 98–111)
Creatinine, Ser: 0.67 mg/dL (ref 0.44–1.00)
GFR, Estimated: >60 mL/min (ref >60)
Glucose, Bld: 94 mg/dL (ref 70–99)
Potassium: 3.7 mmol/L (ref 3.5–5.1)
Sodium: 138 mmol/L (ref 135–145)

## 2021-05-06 LAB — CBC
HCT: 37.4 % (ref 36.0–46.0)
Hemoglobin: 12.5 g/dL (ref 12.0–15.0)
MCH: 29.5 pg (ref 26.0–34.0)
MCHC: 33.4 g/dL (ref 30.0–36.0)
MCV: 88.2 fL (ref 80.0–100.0)
Platelets: 199 10*3/uL (ref 150–400)
RBC: 4.24 MIL/uL (ref 3.87–5.11)
RDW: 12.7 % (ref 11.5–15.5)
WBC: 8.6 10*3/uL (ref 4.0–10.5)
nRBC: 0 % (ref 0.0–0.2)

## 2021-05-06 MED ORDER — KETOROLAC TROMETHAMINE 30 MG/ML IJ SOLN
30.0000 mg | Freq: Once | INTRAMUSCULAR | Status: AC
  Administered 2021-05-06: 30 mg via INTRAVENOUS
  Filled 2021-05-06: qty 1

## 2021-05-06 MED ORDER — DEXTROSE 5 % IV SOLN: 1500.0000 mg | Freq: Once | INTRAVENOUS | Status: DC

## 2021-05-06 MED ORDER — CLINDAMYCIN PHOSPHATE 900 MG/50ML IV SOLN
900.0000 mg | Freq: Once | INTRAVENOUS | Status: AC
Start: 2021-05-06 — End: 2021-05-06
  Administered 2021-05-06: 900 mg via INTRAVENOUS
  Filled 2021-05-06: qty 50

## 2021-05-06 MED ORDER — LIDOCAINE-EPINEPHRINE (PF) 2 %-1:200000 IJ SOLN
10.0000 mL | Freq: Once | INTRAMUSCULAR | Status: AC
  Administered 2021-05-06: 10 mL
  Filled 2021-05-06: qty 20

## 2021-05-06 MED ORDER — CLINDAMYCIN HCL 300 MG PO CAPS: 300.0000 mg | ORAL_CAPSULE | Freq: Three times a day (TID) | ORAL | 0 refills | Status: DC

## 2021-05-06 MED ORDER — HYDROMORPHONE HCL 1 MG/ML IJ SOLN
1.0000 mg | Freq: Once | INTRAMUSCULAR | Status: DC
  Filled 2021-05-06: qty 1

## 2021-05-06 MED ORDER — ONDANSETRON HCL 4 MG/2ML IJ SOLN
4.0000 mg | Freq: Once | INTRAMUSCULAR | Status: DC
  Filled 2021-05-06: qty 2

## 2021-05-06 MED ORDER — DOXYCYCLINE HYCLATE 100 MG PO CAPS: 100.0000 mg | ORAL_CAPSULE | Freq: Two times a day (BID) | ORAL | 0 refills | Status: DC

## 2021-05-06 NOTE — ED Notes (Signed)
IV meds not given. Contraindicated since pt has to driver herself home.

## 2021-05-06 NOTE — Discharge Instructions (Signed)
Please go to the emergency department as soon as you leave urgent care for further evaluation and management. ?

## 2021-05-06 NOTE — ED Triage Notes (Signed)
Pt c/o abscess in the groin. Seen at [facility] yesterday and given abx but today it is hurting more and felt the provider yesterday "blew me off." Now c/o fever, chills, and increased redness in the groin area. States wants second opinion to see if it could be drained.   Onset ~ 4 days ago

## 2021-05-06 NOTE — ED Triage Notes (Signed)
Pt reports abscess to left groin x5 days. Seen at [facility] and nothing was given. Started having chills today.

## 2021-05-06 NOTE — ED Notes (Signed)
EMT-P provided AVS using Teachback Method. Patient verbalizes understanding of Discharge Instructions. Opportunity for Questioning and Answers were provided by EMT-P. Patient Discharged from ED.  ? ?

## 2021-05-06 NOTE — Discharge Instructions (Addendum)
Continue warm compresses.  Remove the packing in 3 days.  Take the doxycycline twice daily for 10 days.  You can take Tylenol and Motrin for fever.  If you continue to feel terrible with fevers and chills at home, if the redness spreads, if the infection looks worse you need to return back to the ED for admission and IV antibiotics.  If you have pain in your vaginal canal or rectum you also need to return to the ED.

## 2021-05-06 NOTE — ED Provider Notes (Signed)
[facility] EMERGENCY DEPT Provider Note   CSN: [id number] Arrival date & time: 05/06/21  1619     History  Chief Complaint  Patient presents with   Abscess    Carla Henderson is a 31 y.o. female.  HPI  Patient presents for abscess.  Abscess is to left inguinal area, there is erythema spreading down her leg x 3 days. She the abscess first 5 days ago, was prescribed Keflex but just started taking it today.  She woke up this morning feeling febrile and having chills.  There was some purulent discharge earlier.Very painful, hurts to walk. No history of the same, no dysuria or hematuria or abdominal pain.  Patient is not diabetic.  Past Medical History:  Diagnosis Date   Abnormal Pap smear    Anemia    Bronchitis    x 1 time   Gestational diabetes    HSV (herpes simplex virus) anogenital infection    Infertility, female    Ovarian cyst    PCOS (polycystic ovarian syndrome)    Spinal headache    UTI (lower urinary tract infection)    Vaginal Pap smear, abnormal      Home Medications Prior to Admission medications   Medication Sig Start Date End Date Taking? Authorizing Provider  doxycycline (VIBRAMYCIN) 100 MG capsule Take 1 capsule (100 mg total) by mouth 2 (two) times daily. 05/06/21  Yes Sherrill Raring, PA-C  Acetaminophen 325 MG/10.15ML SUSP Take 20 mLs (640.3941 mg total) by mouth every 6 (six) hours as needed (moderate pain, do not take with percocet maximum dose 3000 mg in 24 hours). 02/24/21   Sanjuana Kava, MD  Cholecalciferol (VITAMIN D) 50 MCG (2000 UT) CAPS Take 2,000 Units by mouth daily.    [provider]  ibuprofen (ADVIL) 600 MG tablet Take 1 tablet (600 mg total) by mouth every 6 (six) hours as needed for cramping or moderate pain. 02/24/21   Sanjuana Kava, MD  magnesium oxide (MAG-OX) 400 MG tablet Take 400 mg by mouth daily.    [provider]  oxyCODONE-acetaminophen (PERCOCET/ROXICET) 5-325 MG tablet Take 2 tablets by mouth every 4  (four) hours as needed for severe pain (DO NOT TAKE WITH REGULAR TYLENOL). 02/24/21   Sanjuana Kava, MD  Prenatal Vit-Fe Fumarate-FA (PRENATAL MULTIVITAMIN) TABS tablet Take 1 tablet by mouth daily at 12 noon.    [provider]  senna-docusate (SENOKOT-S) 8.6-50 MG tablet Take 2 tablets by mouth at bedtime as needed for mild constipation. 02/24/21   Sanjuana Kava, MD      Allergies    Vicodin [hydrocodone-acetaminophen]    Review of Systems   Review of Systems  Constitutional:  Positive for chills and fever.  Respiratory:  Negative for shortness of breath.   Cardiovascular:  Negative for chest pain.  Gastrointestinal:  Negative for abdominal pain, nausea and vomiting.  Genitourinary:  Negative for dysuria, pelvic pain, vaginal discharge and vaginal pain.  Skin:  Positive for color change.  Neurological:  Negative for syncope.   Physical Exam Updated Vital Signs BP (!) 166/94    Pulse (!) 116    Temp 99.5 F (37.5 C) (Oral)    Resp 16    LMP 05/25/2020    SpO2 100%  Physical Exam Vitals and nursing note reviewed. Exam conducted with a chaperone present.  Constitutional:      Appearance: Normal appearance.  HENT:     Head: Normocephalic and atraumatic.  Eyes:     General: No scleral icterus.  Right eye: No discharge.        Left eye: No discharge.     Extraocular Movements: Extraocular movements intact.     Pupils: Pupils are equal, round, and reactive to light.  Cardiovascular:     Rate and Rhythm: Regular rhythm. Tachycardia present.     Pulses: Normal pulses.     Heart sounds: Normal heart sounds. No murmur heard.   No friction rub. No gallop.  Pulmonary:     Effort: Pulmonary effort is normal. No respiratory distress.     Breath sounds: Normal breath sounds.  Abdominal:     General: Abdomen is flat. Bowel sounds are normal. There is no distension.     Palpations: Abdomen is soft.     Tenderness: There is no abdominal tenderness.  Skin:    General: Skin is  warm and dry.     Capillary Refill: Capillary refill takes less than 2 seconds.     Coloration: Skin is not jaundiced.     Findings: Erythema present.     Comments: Patient has a roughly 2 to 3 cm fluctuant abscess to the left inguinal area.  It is not actively draining, there is surrounding induration extending roughly 2 inches down her lower extremity, does not extend inward to the vaginal canal or distally into the rectum.  Surrounding erythema down the lower extremity.  Hot to touch.  Neurological:     Mental Status: She is alert. Mental status is at baseline.     Coordination: Coordination normal.    ED Results / Procedures / Treatments   Labs (all labs ordered are listed, but only abnormal results are displayed) Labs Reviewed  CBC  BASIC METABOLIC PANEL    EKG None  Radiology No results found.  Procedures .Marland KitchenIncision and Drainage  Date/Time: 05/06/2021 9:39 PM Performed by: Sherrill Raring, PA-C Authorized by: Sherrill Raring, PA-C   Consent:    Consent obtained:  Verbal   Consent given by:  Patient   Risks discussed:  Bleeding, incomplete drainage, pain and damage to other organs   Alternatives discussed:  No treatment Universal protocol:    Procedure explained and questions answered to patient or proxy's satisfaction: yes     Relevant documents present and verified: yes     Test results available : yes     Imaging studies available: yes     Required blood products, implants, devices, and special equipment available: yes     Site/side marked: yes     Immediately prior to procedure, a time out was called: yes     Patient identity confirmed:  Verbally with patient Location:    Type:  Abscess   Location:  Lower extremity   Lower extremity location:  Leg   Leg location:  L upper leg Pre-procedure details:    Skin preparation:  Betadine Sedation:    Sedation type:  None Anesthesia:    Anesthesia method:  Local infiltration   Local anesthetic:  Lidocaine 1% WITH  epi Procedure type:    Complexity:  Complex Procedure details:    Ultrasound guidance: yes     Incision types:  Single straight   Incision depth:  Subcutaneous   Wound management:  Probed and deloculated, irrigated with saline and extensive cleaning   Drainage:  Purulent   Drainage amount:  Moderate   Wound treatment:  Wound left open   Packing materials:  1/4 in gauze   Amount 1/4":  4 inches Post-procedure details:    Procedure completion:  Tolerated well, no immediate complications    Medications Ordered in ED Medications  clindamycin (CLEOCIN) IVPB 900 mg (900 mg Intravenous New Bag/Given 05/06/21 2139)  ketorolac (TORADOL) 30 MG/ML injection 30 mg (30 mg Intravenous Given 05/06/21 2039)  lidocaine-EPINEPHrine (XYLOCAINE W/EPI) 2 %-1:200000 (PF) injection 10 mL (10 mLs Infiltration Given by Other 05/06/21 2039)    ED Course/ Medical Decision Making/ A&P                           Medical Decision Making  This is a 31 year old female presenting due to abscess and cellulitis.  Her temperature is elevated at 99.5 orally, declined rectal temp.  She is tachycardic to 116, rate is regular.  BMP and CBC ordered in triage, no leukocytosis noted.  Physical exam shows induration and abscess.  She also has surrounding erythema consistent with cellulitis.  Does not extend into the genital region, occasionally no pelvic pain or rectal pain.  Does not appear consistent with Fournier's gangrene.  Additionally she is not on any SGLT2 inhibitors, not diabetic although she did have a history of gestational diabetes.  Given she is only 1 dose of outpatient antibiotics, will proceed with IV antibiotics and incision and drainage.  I&D tolerated well.  Purulent material was discharged, packing put in place.  Necrotizing fasciitis this was a consideration but I suspect this is likely more superficial.  We discussed proceeding with CT pelvis to better evaluate, patient prefers I&D at this time.  We engaged  in shared decision making I think that is appropriate.  IV clindamycin given, will discharge with doxycycline.  We discussed admission for IV antibiotics and cellulitis.  Patient states she has a 68-monthold at home and does not wish to be hospitalized.  We engaged in shared decision-making, strict return precautions were discussed and agreed upon.  Specifically if the erythema continues to spread, she continues to be febrile or if her pain worsen she needs to return back to the ED. Additionally if she pelvic or rectal pain she needs to return to likely imaging and re-evaluation. Advised take the packing out in 3 days and to continue warm compresses.  Discussed HPI, physical exam and plan of care for this [facility] The attending physician evaluated this patient as part of a shared visit and agrees with plan of care.         Final Clinical Impression(s) / ED Diagnoses Final diagnoses:  Cellulitis and abscess of left lower extremity    Rx / DC Orders ED Discharge Orders          Ordered    Ambulatory referral to Infectious Disease       Comments: Cellulitis patient:  Received dalbavancin on 05/06/2021.   05/06/21 2101    doxycycline (VIBRAMYCIN) 100 MG capsule  2 times daily        05/06/21 2138              SSherrill Raring PHershal Coria01/13/23 2142    HLuna Fuse MD 05/06/21 2256

## 2021-05-06 NOTE — ED Provider Notes (Signed)
[facility]    CSN: [id number] Arrival date & time: 05/06/21  1503      History   Chief Complaint Chief Complaint  Patient presents with   Abscess    HPI Carla Henderson is a 31 y.o. female.   Patient presents for further evaluation of abscess to left groin that has been present for approximately 4 days.  She saw another healthcare provider yesterday and was prescribed an antibiotic.  She is not sure the name of the antibiotic.  She reports that she developed fever and chills this morning upon awakening.  She is not sure of T-max at home but she reports that she "felt feverish".  She has had some purulent drainage coming from abscess as well.  And she reports that her left leg is hurting as well.   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   Gestational diabetes mellitus (GDM), antepartum 09/27/2015   Overweight 08/17/2015    Past Surgical History:  Procedure Laterality Date   CERVICAL CERCLAGE     CERVICAL CERCLAGE N/A 09/13/2017   Procedure: CERCLAGE CERVICAL;  Surgeon: Crawford Givens, MD;  Location: [facility];  Service: Gynecology;  Laterality: N/A;   CERVICAL CERCLAGE N/A 09/08/2020   Procedure: CERCLAGE CERVICAL;  Surgeon: Crawford Givens, MD;  Location: [facility];  Service: Gynecology;  Laterality: N/A;   CESAREAN SECTION N/A 02/22/2021   Procedure: CESAREAN SECTION;  Surgeon: Christophe Louis, MD;  Location: [place] [facility];  Service: Obstetrics;  Laterality: N/A;   CHOLECYSTECTOMY     LAPAROSCOPIC CHOLECYSTECTOMY SINGLE SITE WITH INTRAOPERATIVE CHOLANGIOGRAM N/A 03/01/2017   Procedure: LAPAROSCOPIC CHOLECYSTECTOMY SINGLE SITE WITH INTRAOPERATIVE CHOLANGIOGRAM;  Surgeon: Michael Boston, MD;  Location: [facility];  Service: General;  Laterality: N/A;   LAPAROSCOPIC OVARIAN CYSTECTOMY Left 01/07/2015   Procedure: Attempted LAPAROSCOPIC OVARIAN CYSTECTOMY, Mini laparotomy with excision of left ovarian cyst;  Surgeon: Mora Bellman, MD;  Location: [facility];  Service: Gynecology;  Laterality: Left;  Requested 01-26-15 @ 3:30p   WISDOM TOOTH EXTRACTION      OB History     Gravida  4   Para  2   Term  2   Preterm      AB  2   Living  2      SAB  2   IAB      Ectopic      Multiple  0   Live Births  2            Home Medications    Prior to Admission medications   Medication Sig Start Date End Date Taking? Authorizing Provider  Acetaminophen 325 MG/10.15ML SUSP Take 20 mLs (640.3941 mg total) by mouth every 6 (six) hours as needed (moderate pain, do not take with percocet maximum dose 3000 mg in 24 hours). 02/24/21  Sanjuana Kava, MD  Cholecalciferol (VITAMIN D) 50 MCG (2000 UT) CAPS Take 2,000 Units by mouth daily.    [provider]  ibuprofen (ADVIL) 600 MG tablet Take 1 tablet (600 mg total) by mouth every 6 (six) hours as needed for cramping or moderate pain. 02/24/21   Sanjuana Kava, MD  magnesium oxide (MAG-OX) 400 MG tablet Take 400 mg by mouth daily.    [provider]  oxyCODONE-acetaminophen (PERCOCET/ROXICET) 5-325 MG tablet Take 2 tablets by mouth every 4 (four) hours as needed for severe pain (DO NOT TAKE WITH REGULAR TYLENOL). 02/24/21   Sanjuana Kava, MD  Prenatal Vit-Fe Fumarate-FA (PRENATAL MULTIVITAMIN) TABS tablet Take 1 tablet by mouth daily at 12 noon.    [provider]  senna-docusate (SENOKOT-S) 8.6-50 MG tablet Take 2 tablets  by mouth at bedtime as needed for mild constipation. 02/24/21   Sanjuana Kava, MD    Family History Family History  Problem Relation Age of Onset   Hypertension Mother    Healthy Father    Hypertension Paternal Grandmother    Diabetes Paternal Grandfather    Other Neg Hx     Social History Social History   Tobacco Use   Smoking status: Never   Smokeless tobacco: Never  Vaping Use   Vaping Use: Never used  Substance Use Topics   Alcohol use: Not Currently    Comment: not while rpeg   Drug use: No     Allergies   Vicodin [hydrocodone-acetaminophen]   Review of Systems Review of Systems Per HPI  Physical Exam Triage Vital Signs ED Triage Vitals [05/06/21 1526]  Enc Vitals Group     BP (!) 152/97     Pulse Rate (!) 105     Resp 18     Temp 99.1 F (37.3 C)     Temp Source Oral     SpO2 98 %     Weight      Height      Head Circumference      Peak Flow      Pain Score 0     Pain Loc      Pain Edu?      Excl. in [place]?    No data found.  Updated Vital Signs BP (!) 152/97 (BP Location: Right Arm)    Pulse (!) 105    Temp 99.1 F (37.3 C) (Oral)    Resp 18    LMP 05/25/2020    SpO2 98%    Breastfeeding Yes   Visual Acuity Right Eye Distance:   Left Eye Distance:   Bilateral Distance:    Right Eye Near:   Left Eye Near:    Bilateral Near:     Physical Exam Exam conducted with a chaperone present.  Constitutional:      General: She is not in acute distress.    Appearance: Normal appearance. She is not toxic-appearing or diaphoretic.  HENT:     Head: Normocephalic and atraumatic.  Eyes:     Extraocular Movements: Extraocular movements intact.     Conjunctiva/sclera: Conjunctivae normal.  Pulmonary:     Effort: Pulmonary effort is normal.  Skin:    Findings: Abscess present.     Comments: Patient has approximately 3 to 4 cm in diameter indurated abscess present to left groin.  Purulent drainage noted on exam.  Also has surrounding erythema that  extends into left thigh.  Neurological:     General: No focal deficit present.  Mental Status: She is alert and oriented to person, place, and time. Mental status is at baseline.  Psychiatric:        Mood and Affect: Mood normal.        Behavior: Behavior normal.        Thought Content: Thought content normal.        Judgment: Judgment normal.     UC Treatments / Results  Labs (all labs ordered are listed, but only abnormal results are displayed) Labs Reviewed - No data to display  EKG   Radiology No results found.  Procedures Procedures (including critical care time)  Medications Ordered in UC Medications - No data to display  Initial Impression / Assessment and Plan / UC Course  I have reviewed the triage vital signs and the nursing notes.  Pertinent labs & imaging results that were available during my care of the patient were reviewed by me and considered in my medical decision making (see chart for details).     Patient was advised that she should go to the hospital for further evaluation and management given fever and chills in the presence of abscess.  Patient was agreeable with  plan.  Vital signs stable at discharge.  Agree with patient self transport to the hospital. Final Clinical Impressions(s) / UC Diagnoses   Final diagnoses:  Abscess of left groin     Discharge Instructions      Please go to the emergency department as soon as you leave urgent care for further evaluation and management.    ED Prescriptions   None    PDMP not reviewed this encounter.   Teodora Medici, [place] 05/06/21 [phone]

## 2021-05-06 NOTE — ED Notes (Signed)
Patient given manual breast pump due to patient being engorged and leaking.

## 2021-05-08 ENCOUNTER — Other Ambulatory Visit: Payer: Self-pay

## 2021-05-08 ENCOUNTER — Inpatient Hospital Stay (HOSPITAL_BASED_OUTPATIENT_CLINIC_OR_DEPARTMENT_OTHER)
Admission: EM | Admit: 2021-05-08 | Discharge: 2021-05-11 | DRG: 603 | Disposition: A | Discharge status: Home / Self Care | Payer: Medicaid Other | Attending: Internal Medicine | Admitting: Internal Medicine

## 2021-05-08 ENCOUNTER — Encounter (HOSPITAL_BASED_OUTPATIENT_CLINIC_OR_DEPARTMENT_OTHER): Payer: Self-pay | Admitting: Obstetrics and Gynecology

## 2021-05-08 DIAGNOSIS — A609 Anogenital herpesviral infection, unspecified: Secondary | ICD-10-CM | POA: Diagnosis present

## 2021-05-08 DIAGNOSIS — Z20822 Contact with and (suspected) exposure to covid-19: Secondary | ICD-10-CM | POA: Diagnosis present

## 2021-05-08 DIAGNOSIS — L02416 Cutaneous abscess of left lower limb: Secondary | ICD-10-CM | POA: Diagnosis present

## 2021-05-08 DIAGNOSIS — Z79899 Other long term (current) drug therapy: Secondary | ICD-10-CM

## 2021-05-08 DIAGNOSIS — Z885 Allergy status to narcotic agent status: Secondary | ICD-10-CM

## 2021-05-08 DIAGNOSIS — R519 Headache, unspecified: Secondary | ICD-10-CM | POA: Diagnosis present

## 2021-05-08 DIAGNOSIS — L03116 Cellulitis of left lower limb: Principal | ICD-10-CM | POA: Diagnosis present

## 2021-05-08 DIAGNOSIS — Z9049 Acquired absence of other specified parts of digestive tract: Secondary | ICD-10-CM

## 2021-05-08 LAB — CBC WITH DIFFERENTIAL/PLATELET
Abs Immature Granulocytes: 0.03 10*3/uL (ref 0.00–0.07)
Basophils Absolute: 0 10*3/uL (ref 0.0–0.1)
Basophils Relative: 0 %
Eosinophils Absolute: 0.1 10*3/uL (ref 0.0–0.5)
Eosinophils Relative: 1 %
HCT: 33.2 % — ABNORMAL LOW (ref 36.0–46.0)
Hemoglobin: 10.9 g/dL — ABNORMAL LOW (ref 12.0–15.0)
Immature Granulocytes: 1 %
Lymphocytes Relative: 27 %
Lymphs Abs: 1.5 10*3/uL (ref 0.7–4.0)
MCH: 29.2 pg (ref 26.0–34.0)
MCHC: 32.8 g/dL (ref 30.0–36.0)
MCV: 89 fL (ref 80.0–100.0)
Monocytes Absolute: 0.3 10*3/uL (ref 0.1–1.0)
Monocytes Relative: 6 %
Neutro Abs: 3.6 10*3/uL (ref 1.7–7.7)
Neutrophils Relative %: 65 %
Platelets: 198 10*3/uL (ref 150–400)
RBC: 3.73 MIL/uL — ABNORMAL LOW (ref 3.87–5.11)
RDW: 12.8 % (ref 11.5–15.5)
WBC: 5.5 10*3/uL (ref 4.0–10.5)
nRBC: 0 % (ref 0.0–0.2)

## 2021-05-08 LAB — BASIC METABOLIC PANEL
Anion gap: 9 (ref 5–15)
BUN: 11 mg/dL (ref 6–20)
CO2: 26 mmol/L (ref 22–32)
Calcium: 9.1 mg/dL (ref 8.9–10.3)
Chloride: 104 mmol/L (ref 98–111)
Creatinine, Ser: 0.65 mg/dL (ref 0.44–1.00)
GFR, Estimated: >60 mL/min (ref >60)
Glucose, Bld: 90 mg/dL (ref 70–99)
Potassium: 3.6 mmol/L (ref 3.5–5.1)
Sodium: 139 mmol/L (ref 135–145)

## 2021-05-08 LAB — LACTIC ACID, PLASMA: Lactic Acid, Venous: 0.5 mmol/L (ref 0.5–1.9)

## 2021-05-08 MED ORDER — PIPERACILLIN-TAZOBACTAM 3.375 G IVPB 30 MIN
3.3750 g | Freq: Once | INTRAVENOUS | Status: AC
  Administered 2021-05-08: 3.375 g via INTRAVENOUS
  Filled 2021-05-08: qty 50

## 2021-05-08 MED ORDER — ONDANSETRON HCL 4 MG/2ML IJ SOLN
4.0000 mg | Freq: Once | INTRAMUSCULAR | Status: AC
  Administered 2021-05-08: 4 mg via INTRAVENOUS
  Filled 2021-05-08: qty 2

## 2021-05-08 MED ORDER — IBUPROFEN 800 MG PO TABS
800.0000 mg | ORAL_TABLET | Freq: Once | ORAL | Status: DC
  Filled 2021-05-08: qty 1

## 2021-05-08 MED ORDER — SODIUM CHLORIDE 0.9 % IV BOLUS
1000.0000 mL | Freq: Once | INTRAVENOUS | Status: AC
  Administered 2021-05-08: 1000 mL via INTRAVENOUS

## 2021-05-08 MED ORDER — HYDROMORPHONE HCL 1 MG/ML IJ SOLN
1.0000 mg | Freq: Once | INTRAMUSCULAR | Status: AC
  Administered 2021-05-08: 1 mg via INTRAVENOUS
  Filled 2021-05-08: qty 1

## 2021-05-08 MED ORDER — KETOROLAC TROMETHAMINE 30 MG/ML IJ SOLN
30.0000 mg | Freq: Once | INTRAMUSCULAR | Status: AC
  Administered 2021-05-08: 30 mg via INTRAVENOUS
  Filled 2021-05-08: qty 1

## 2021-05-08 MED ORDER — VANCOMYCIN HCL IN DEXTROSE 1-5 GM/200ML-% IV SOLN
1000.0000 mg | INTRAVENOUS | Status: AC
  Administered 2021-05-09 (×2): 1000 mg via INTRAVENOUS
  Filled 2021-05-08 (×2): qty 200

## 2021-05-08 NOTE — ED Triage Notes (Signed)
Patient reports to the ER for spreading Cellulitis. Patient reports it has migrated down to her knee cap and the spreading is getting worse

## 2021-05-08 NOTE — ED Provider Notes (Signed)
[facility] EMERGENCY DEPT Provider Note   CSN: [id number] Arrival date & time: 05/08/21  2014     History  Chief Complaint  Patient presents with   Cellulitis    Carla Henderson is a 31 y.o. female presenting the emergency department with pain and redness in her left leg.  The patient was seen 3 days ago in the emergency department with concern for an abscess in her left medial thigh, which was drained and then packed in the emergency department.  She was discharged home on clindamycin and doxycycline which she has been taking regularly.  She reports however the redness continues to spread further down her leg towards her knee into the lateral aspect of her thigh.  The pain feels worse.  She also describes now headaches, fevers, chills.  She is 2 months postpartum from an emergency C-section due to fetal decelerations.  She was initially breast-feeding the baby, the baby is now formula feeding.  She has been pumping and dumping her milk supply while on her current antibiotics.  Her husband is at home taking care of her baby.  HPI     Home Medications Prior to Admission medications   Medication Sig Start Date End Date Taking? Authorizing Provider  Acetaminophen 325 MG/10.15ML SUSP Take 20 mLs (640.3941 mg total) by mouth every 6 (six) hours as needed (moderate pain, do not take with percocet maximum dose 3000 mg in 24 hours). 02/24/21   Sanjuana Kava, MD  Cholecalciferol (VITAMIN D) 50 MCG (2000 UT) CAPS Take 2,000 Units by mouth daily.    [provider]  clindamycin (CLEOCIN) 300 MG capsule Take 1 capsule (300 mg total) by mouth 3 (three) times daily for 7 days. 05/06/21 05/13/21  Luna Fuse, MD  ibuprofen (ADVIL) 600 MG tablet Take 1 tablet (600 mg total) by mouth every 6 (six) hours as needed for cramping or moderate pain. 02/24/21   Sanjuana Kava, MD  magnesium oxide (MAG-OX) 400 MG tablet Take 400 mg by mouth daily.    [provider]   oxyCODONE-acetaminophen (PERCOCET/ROXICET) 5-325 MG tablet Take 2 tablets by mouth every 4 (four) hours as needed for severe pain (DO NOT TAKE WITH REGULAR TYLENOL). 02/24/21   Sanjuana Kava, MD  Prenatal Vit-Fe Fumarate-FA (PRENATAL MULTIVITAMIN) TABS tablet Take 1 tablet by mouth daily at 12 noon.    [provider]  senna-docusate (SENOKOT-S) 8.6-50 MG tablet Take 2 tablets by mouth at bedtime as needed for mild constipation. 02/24/21   Sanjuana Kava, MD      Allergies    Vicodin [hydrocodone-acetaminophen]    Review of Systems   Review of Systems  Physical Exam Updated Vital Signs BP 113/70 (BP Location: Left Arm)    Pulse 73    Temp (!) 97.4 F (36.3 C) (Oral)    Resp 14    Ht 5' 9"  (1.753 m)    Wt 94.8 kg    LMP 05/25/2020    SpO2 97%    Breastfeeding No    BMI 30.86 kg/m  Physical Exam Constitutional:      General: She is not in acute distress. HENT:     Head: Normocephalic and atraumatic.  Eyes:     Conjunctiva/sclera: Conjunctivae normal.     Pupils: Pupils are equal, round, and reactive to light.  Cardiovascular:     Rate and Rhythm: Normal rate and regular rhythm.  Pulmonary:     Effort: Pulmonary effort is normal. No respiratory distress.  Abdominal:  General: There is no distension.     Tenderness: There is no abdominal tenderness.  Skin:    General: Skin is warm and dry.     Comments: Tenderness, and induration of the left lateral thigh, packing removed from abscess wound.  No further expression of pus from the wound. Erythema extending down the leg towards the knee and around the lateral aspect of the left thigh.  Neurological:     General: No focal deficit present.     Mental Status: She is alert. Mental status is at baseline.  Psychiatric:        Mood and Affect: Mood normal.        Behavior: Behavior normal.    ED Results / Procedures / Treatments   Labs (all labs ordered are listed, but only abnormal results are displayed) Labs Reviewed  CBC  WITH DIFFERENTIAL/PLATELET - Abnormal; Notable for the following components:      Result Value   RBC 3.73 (*)    Hemoglobin 10.9 (*)    HCT 33.2 (*)    All other components within normal limits  CBC - Abnormal; Notable for the following components:   RBC 3.37 (*)    Hemoglobin 10.1 (*)    HCT 30.2 (*)    All other components within normal limits  BASIC METABOLIC PANEL - Abnormal; Notable for the following components:   Glucose, Bld 111 (*)    Calcium 8.2 (*)    All other components within normal limits  RESP PANEL BY RT-PCR (FLU A&B, COVID) ARPGX2  CULTURE, BLOOD (ROUTINE X 2)  CULTURE, BLOOD (ROUTINE X 2)  BASIC METABOLIC PANEL  LACTIC ACID, PLASMA  HIV ANTIBODY (ROUTINE TESTING W REFLEX)    EKG EKG Interpretation  Date/Time:  Monday May 09 2021 00:02:09 EST Ventricular Rate:  85 PR Interval:  149 QRS Duration: 101 QT Interval:  352 QTC Calculation: 419 R Axis:   -18 Text Interpretation: Sinus rhythm Borderline left axis deviation Borderline T abnormalities, seen on prior tracing Sept 2016, no STEMI Confirmed by Octaviano Glow [phone]) on 05/09/2021 9:58:59 AM  Radiology No results found.  Procedures .Critical Care Performed by: Wyvonnia Dusky, MD Authorized by: Wyvonnia Dusky, MD   Critical care provider statement:    Critical care time (minutes):  45   Critical care time was exclusive of:  Separately billable procedures and treating other patients   Critical care was necessary to treat or prevent imminent or life-threatening deterioration of the following conditions:  Sepsis   Critical care was time spent personally by me on the following activities:  Ordering and performing treatments and interventions, ordering and review of laboratory studies, ordering and review of radiographic studies, pulse oximetry, review of old charts, examination of patient and evaluation of patient's response to treatment    Medications Ordered in ED Medications   acetaminophen (TYLENOL) tablet 650 mg (650 mg Oral Given 05/09/21 0904)    Or  acetaminophen (TYLENOL) suppository 650 mg ( Rectal See Alternative 05/09/21 0904)  lactated ringers infusion ( Intravenous New Bag/Given 05/09/21 0538)  ondansetron (ZOFRAN) tablet 4 mg ( Oral See Alternative 05/09/21 0917)    Or  ondansetron (ZOFRAN) injection 4 mg (4 mg Intravenous Given 05/09/21 0917)  traMADol (ULTRAM) tablet 50 mg (has no administration in time range)  ibuprofen (ADVIL) tablet 400 mg (has no administration in time range)  vancomycin (VANCOREADY) IVPB 1250 mg/250 mL (has no administration in time range)  ceFEPIme (MAXIPIME) 2 g in sodium chloride 0.9 %  100 mL IVPB (has no administration in time range)  ketorolac (TORADOL) 30 MG/ML injection 30 mg (30 mg Intravenous Given 05/08/21 2251)  HYDROmorphone (DILAUDID) injection 1 mg (1 mg Intravenous Given 05/08/21 2258)  ondansetron (ZOFRAN) injection 4 mg (4 mg Intravenous Given 05/08/21 2253)  sodium chloride 0.9 % bolus 1,000 mL (0 mLs Intravenous Stopped 05/09/21 0003)  piperacillin-tazobactam (ZOSYN) IVPB 3.375 g (0 g Intravenous Stopped 05/09/21 0003)  vancomycin (VANCOCIN) IVPB 1000 mg/200 mL premix (1,000 mg Intravenous New Bag/Given 05/09/21 0147)  HYDROmorphone (DILAUDID) injection 1 mg (1 mg Intravenous Given 05/09/21 0408)  ondansetron (ZOFRAN) injection 4 mg (4 mg Intravenous Given 05/09/21 0408)    ED Course/ Medical Decision Making/ A&P Clinical Course as of 05/09/21 1002  Sun May 08, 2021  2243 Confirmed with the patient that she is not actively breast-feeding, they are formula feeding the baby, and she has pumping and dumping currently with her milk supply.  She does feel that she needs stronger pain medications, Dilaudid ordered, she would not be able to breast-feed for the next 48 hours.  IV antibiotics also ordered broad-spectrum. [MT]  2245 Bedside ultrasound demonstrates cobblestoning of the skin around the former abscess site, I do not  see recurrent drainable collection, there is induration of the skin around the site. [MT]  2332 White blood cell count and lactate are within normal limits.  Lower suspicion for severe sepsis [MT]  2356 Admitted to hospitalist [MT]    Clinical Course User Index [MT] Shannah Conteh, Carola Rhine, MD                           Medical Decision Making  This patient presents to the ED with concern for skin infection. This involves an extensive number of treatment options, and is a complaint that carries with it a high risk of complications and morbidity.  The differential diagnosis includes worsening cellulitis vs sepsis vs viral illness vs other  She has failed outpatient therapy with MRSA coverage (doxy, clinda per her report).  Redness continues to extend around leg; now having systemic symptoms.  I ordered and personally interpreted labs.  The pertinent results include:  WBC, lactate wnl.  Packing removed from wound from 3 days ago, some minor continued purulent drainage   The patient was maintained on a cardiac monitor.  I personally viewed and interpreted the cardiac monitored which showed an underlying rhythm of: sinus rhythm  Per my interpretation the patient's ECG shows NSR, no sig changes from prior tracing, no acute ischemia  I ordered medication including IV vanco, IV zosyn, IV fluids for infection; IV pain medications I have reviewed the patients home medicines and have made adjustments as needed  Test Considered:  - No clinical signs of nec fasc; normal WBC and lactate; I don't believe we need emergent CT imagings - bedside ultrasound showed cobblestoning of the prior incision site consistent with edema; no recurring abscess noted   After the interventions noted above, I reevaluated the patient and found that they have: improved  Social Determinants of Health: recently post-partum; breast-feeding intermittently  Dispostion:  After consideration of the diagnostic results and the  patients response to treatment, I feel that the patent would benefit from hospitalization for IV antibiotics and bacteremia rule out.  Clinical Course as of 05/09/21 1002  Sun May 08, 2021  2243 Confirmed with the patient that she is not actively breast-feeding, they are formula feeding the baby, and she has pumping  and dumping currently with her milk supply.  She does feel that she needs stronger pain medications, Dilaudid ordered, she would not be able to breast-feed for the next 48 hours.  IV antibiotics also ordered broad-spectrum. [MT]  2245 Bedside ultrasound demonstrates cobblestoning of the skin around the former abscess site, I do not see recurrent drainable collection, there is induration of the skin around the site. [MT]  2332 White blood cell count and lactate are within normal limits.  Lower suspicion for severe sepsis [MT]  2356 Admitted to hospitalist [MT]    Clinical Course User Index [MT] Mykale Gandolfo, Carola Rhine, MD             Final Clinical Impression(s) / ED Diagnoses Final diagnoses:  Cellulitis of left lower extremity    Rx / DC Orders ED Discharge Orders     None         Wyvonnia Dusky, MD 05/09/21 1002

## 2021-05-09 ENCOUNTER — Encounter (HOSPITAL_COMMUNITY): Payer: Self-pay | Admitting: Internal Medicine

## 2021-05-09 DIAGNOSIS — Z9049 Acquired absence of other specified parts of digestive tract: Secondary | ICD-10-CM | POA: Diagnosis not present

## 2021-05-09 DIAGNOSIS — Z885 Allergy status to narcotic agent status: Secondary | ICD-10-CM | POA: Diagnosis not present

## 2021-05-09 DIAGNOSIS — Z20822 Contact with and (suspected) exposure to covid-19: Secondary | ICD-10-CM | POA: Diagnosis present

## 2021-05-09 DIAGNOSIS — Z79899 Other long term (current) drug therapy: Secondary | ICD-10-CM | POA: Diagnosis not present

## 2021-05-09 DIAGNOSIS — L02416 Cutaneous abscess of left lower limb: Secondary | ICD-10-CM | POA: Diagnosis present

## 2021-05-09 DIAGNOSIS — L03116 Cellulitis of left lower limb: Principal | ICD-10-CM

## 2021-05-09 DIAGNOSIS — A609 Anogenital herpesviral infection, unspecified: Secondary | ICD-10-CM | POA: Diagnosis present

## 2021-05-09 DIAGNOSIS — R519 Headache, unspecified: Secondary | ICD-10-CM | POA: Diagnosis present

## 2021-05-09 LAB — CBC
HCT: 30.2 % — ABNORMAL LOW (ref 36.0–46.0)
Hemoglobin: 10.1 g/dL — ABNORMAL LOW (ref 12.0–15.0)
MCH: 30 pg (ref 26.0–34.0)
MCHC: 33.4 g/dL (ref 30.0–36.0)
MCV: 89.6 fL (ref 80.0–100.0)
Platelets: 165 10*3/uL (ref 150–400)
RBC: 3.37 MIL/uL — ABNORMAL LOW (ref 3.87–5.11)
RDW: 12.7 % (ref 11.5–15.5)
WBC: 5.3 10*3/uL (ref 4.0–10.5)
nRBC: 0 % (ref 0.0–0.2)

## 2021-05-09 LAB — BASIC METABOLIC PANEL
Anion gap: 6 (ref 5–15)
BUN: 8 mg/dL (ref 6–20)
CO2: 25 mmol/L (ref 22–32)
Calcium: 8.2 mg/dL — ABNORMAL LOW (ref 8.9–10.3)
Chloride: 106 mmol/L (ref 98–111)
Creatinine, Ser: 0.62 mg/dL (ref 0.44–1.00)
GFR, Estimated: >60 mL/min (ref >60)
Glucose, Bld: 111 mg/dL — ABNORMAL HIGH (ref 70–99)
Potassium: 3.6 mmol/L (ref 3.5–5.1)
Sodium: 137 mmol/L (ref 135–145)

## 2021-05-09 LAB — RESP PANEL BY RT-PCR (FLU A&B, COVID) ARPGX2
Influenza A by PCR: NEGATIVE
Influenza B by PCR: NEGATIVE
SARS Coronavirus 2 by RT PCR: NEGATIVE

## 2021-05-09 LAB — HIV ANTIBODY (ROUTINE TESTING W REFLEX): HIV Screen 4th Generation wRfx: NONREACTIVE

## 2021-05-09 MED ORDER — SODIUM CHLORIDE 0.9 % IV SOLN
2.0000 g | Freq: Three times a day (TID) | INTRAVENOUS | Status: DC
  Administered 2021-05-09 – 2021-05-11 (×7): 2 g via INTRAVENOUS
  Filled 2021-05-09 (×8): qty 2

## 2021-05-09 MED ORDER — TRAMADOL HCL 50 MG PO TABS: 50.0000 mg | ORAL_TABLET | Freq: Four times a day (QID) | ORAL | Status: DC | PRN

## 2021-05-09 MED ORDER — VANCOMYCIN HCL 1250 MG/250ML IV SOLN
1250.0000 mg | Freq: Two times a day (BID) | INTRAVENOUS | Status: DC
  Administered 2021-05-09 – 2021-05-11 (×5): 1250 mg via INTRAVENOUS
  Filled 2021-05-09 (×5): qty 250

## 2021-05-09 MED ORDER — ONDANSETRON HCL 4 MG/2ML IJ SOLN
4.0000 mg | Freq: Once | INTRAMUSCULAR | Status: AC
  Administered 2021-05-09: 4 mg via INTRAVENOUS
  Filled 2021-05-09: qty 2

## 2021-05-09 MED ORDER — IBUPROFEN 400 MG PO TABS
400.0000 mg | ORAL_TABLET | Freq: Four times a day (QID) | ORAL | Status: DC | PRN
  Administered 2021-05-09 – 2021-05-11 (×4): 400 mg via ORAL
  Filled 2021-05-09 (×4): qty 1

## 2021-05-09 MED ORDER — ONDANSETRON HCL 4 MG/2ML IJ SOLN
4.0000 mg | Freq: Four times a day (QID) | INTRAMUSCULAR | Status: DC | PRN
  Administered 2021-05-09: 4 mg via INTRAVENOUS
  Filled 2021-05-09: qty 2

## 2021-05-09 MED ORDER — SODIUM CHLORIDE 0.9 % IV SOLN: 1.0000 g | Freq: Two times a day (BID) | INTRAVENOUS | Status: DC

## 2021-05-09 MED ORDER — PIPERACILLIN-TAZOBACTAM 3.375 G IVPB: 3.3750 g | Freq: Three times a day (TID) | INTRAVENOUS | Status: DC

## 2021-05-09 MED ORDER — HYDROMORPHONE HCL 1 MG/ML IJ SOLN
1.0000 mg | Freq: Once | INTRAMUSCULAR | Status: AC
Start: 2021-05-09 — End: 2021-05-09
  Administered 2021-05-09: 1 mg via INTRAVENOUS
  Filled 2021-05-09: qty 1

## 2021-05-09 MED ORDER — ACETAMINOPHEN 650 MG RE SUPP: 650.0000 mg | Freq: Four times a day (QID) | RECTAL | Status: DC | PRN

## 2021-05-09 MED ORDER — PIPERACILLIN-TAZOBACTAM 3.375 G IVPB
3.3750 g | Freq: Three times a day (TID) | INTRAVENOUS | Status: DC
  Administered 2021-05-09: 3.375 g via INTRAVENOUS
  Filled 2021-05-09: qty 50

## 2021-05-09 MED ORDER — ONDANSETRON HCL 4 MG PO TABS: 4.0000 mg | ORAL_TABLET | Freq: Four times a day (QID) | ORAL | Status: DC | PRN

## 2021-05-09 MED ORDER — ACETAMINOPHEN 325 MG PO TABS
650.0000 mg | ORAL_TABLET | Freq: Four times a day (QID) | ORAL | Status: DC | PRN
  Administered 2021-05-09: 650 mg via ORAL
  Filled 2021-05-09: qty 2

## 2021-05-09 MED ORDER — SULFAMETHOXAZOLE-TRIMETHOPRIM 800-160 MG PO TABS
1.0000 | ORAL_TABLET | Freq: Two times a day (BID) | ORAL | Status: DC
  Filled 2021-05-09: qty 1

## 2021-05-09 MED ORDER — LACTATED RINGERS IV SOLN: INTRAVENOUS | Status: DC

## 2021-05-09 MED ORDER — VANCOMYCIN HCL 1250 MG/250ML IV SOLN
1250.0000 mg | Freq: Two times a day (BID) | INTRAVENOUS | Status: DC
  Filled 2021-05-09: qty 250

## 2021-05-09 NOTE — Progress Notes (Addendum)
HOSPITAL MEDICINE ACCEPTANCE NOTE    31 year old female with past medical history of obesity, gestational diabetes mellitus presenting with left thigh pain redness and swelling that began approximately 5 days ago to [place] emergency department.  Patient initially presented on 1/13 during which an abscess was identified and a bedside incision and drainage was performed with wound being packed.  Patient was administered a dose of intravenous clindamycin and sent home with a course of oral doxycycline per my review of the ER provider's note by day.  Patient continued to experience worsening redness and swelling of the extremity despite taking antibiotics.  Patient presents once again with worsening symptoms.  Bedside ultrasound reveals no residual abscess according to the ER provider and therefore they felt that surgical consultation was not needed at this time.  Packing was removed however ongoing purulent drainage was observed.  Patient was given a dose of intravenous vancomycin, intravenous Zosyn and 1 L of normal saline.  ER provider requesting hospitalization for intravenous antibiotics while hospitalized due to failure of oral outpatient treatment.  Bed request placed for MedSurg bed at [facility].  Vernelle Emerald  MD [facility]

## 2021-05-09 NOTE — Progress Notes (Signed)
Pharmacy Antibiotic Note  Carla Henderson is a 31 y.o. female admitted on 05/08/2021 with  worsening cellulitis .  Pharmacy has been consulted for Vancomycin/Zosyn dosing. WBC WNL. Renal function good. Recent bedside I&D and started on oral anti-biotics but cellulitis is worsening.   Plan: Vancomycin 2000 mg IV x 1, then 1250 mg IV q12h >>>Estimated AUC: 504 Zosyn 3.375G IV q8h to be infused over 4 hours Trend WBC, temp, renal function  F/U infectious work-up Drug levels as indicated   Temp (24hrs), Avg:99 F (37.2 C), Min:98.6 F (37 C), Max:99.4 F (37.4 C)  Recent Labs  Lab 05/06/21 1638 05/08/21 2115 05/08/21 2234  WBC 8.6 5.5  --   CREATININE 0.67 0.65  --   LATICACIDVEN  --   --  0.5    CrCl cannot be calculated (Unknown ideal weight.).    Allergies  Allergen Reactions   Vicodin [Hydrocodone-Acetaminophen] Nausea And Vomiting and Other (See Comments)    Pt states does not cause swelling    Narda Bonds, PharmD, BCPS Clinical Pharmacist Phone: [phone]

## 2021-05-09 NOTE — Consult Note (Signed)
WOC Nurse Consult Note: Patient receiving care in [place].  Reason for Consult: "I&D site left upper thigh" Wound type: I&D site Pressure Injury POA: Yes/No/NA Measurement:packing not removed; supplies have to be obtained. Wound bed: indurated Drainage (amount, consistency, odor) bloody Periwound: erythematous Dressing procedure/placement/frequency: REMOVE any packing present. Wash the wound in the left groin/inguinal area with soap and water, pat dry. Insert 1/4 inch Iodoform Kellie Simmering [phone]) into the wound. Place a small piece of Aquacel Advantage Kellie Simmering [phone]) over the wound, then cover with dry gauze, tape in place. Change daily.  Monitor the wound area(s) for worsening of condition such as: Signs/symptoms of infection,  Increase in size,  Development of or worsening of odor, Development of pain, or increased pain at the affected locations.  Notify the medical team if any of these develop.  Thank you for the consult. [facility] nurse will not follow at this time.  Please re-consult the [facility] team if needed.  Val Riles, RN, MSN, CWOCN, CNS-BC, pager [phone]

## 2021-05-09 NOTE — Progress Notes (Signed)
°  Transition of Care (TOC) Screening Note   Patient Details  Name: Carla Henderson Date of Birth: [birth year]   Transition of Care [facility]) CM/SW Contact:    Lennart Pall, LCSW Phone Number: 05/09/2021, 2:47 PM    Transition of Care Department [facility]) has reviewed patient and no TOC needs have been identified at this time. We will continue to monitor patient advancement through interdisciplinary progression rounds. If new patient transition needs arise, please place a TOC consult.

## 2021-05-09 NOTE — Progress Notes (Signed)
MD Chotiner was secured chat because patient was c/o a headache and nausea. Order given for one time dose of Dilaudid 77m IV and Zofran 462mIV.

## 2021-05-09 NOTE — Progress Notes (Signed)
[facility] PROGRESS NOTE    Progress Note  Carla Henderson  IOX:[id number] DOB: August 14, 1990 DOA: 05/08/2021 PCP: [facility]     Brief Narrative:   Carla Henderson is an 31 y.o. female past medical history significant for C-section 2 months ago was seen in the ED 3 days prior to admission for an abscess of her left upper thigh I&D was performed and packed started on clindamycin comes back to the hospital for worsening erythema and pain, will start on vancomycin and Zosyn on admission    Assessment/Plan:   Cellulitis of left lower extremity T-max of 98.6 has remained afebrile. Blood cultures remain negative till date. Continue IV antibiotics IV Vanco and cefepime.   Ibuprofen and Tylenol for pain and fever. Wound care has been consulted.      DVT prophylaxis: lovenox Family Communication:none Status is: Inpatient  Remains inpatient appropriate because: Lower extremity cellulitis        Code Status:     Code Status Orders  (From admission, onward)           Start     Ordered   05/09/21 0401  Full code  Continuous        05/09/21 0403           Code Status History     Date Active Date Inactive Code Status Order ID Comments User Context   02/22/2021 1703 02/22/2021 2335 Full Code [id number]  Sanjuana Kava, MD Inpatient   03/19/2018 0346 03/21/2018 1631 Full Code [id number]  Noralyn Pick, [place] Inpatient   03/18/2018 0405 03/19/2018 0345 Full Code [id number]  Elnora Morrison, CNM Inpatient   02/28/2017 2008 03/02/2017 1719 Full Code [id number]  Kalman Drape, [place] Inpatient   10/01/2015 0246 10/01/2015 1733 Full Code [id number]  Tresea Mall, CNM Inpatient   01/07/2015 1719 01/08/2015 1340 Full Code [id number]  Constant, Vickii Chafe, MD Inpatient         IV Access:   Peripheral IV   Procedures and diagnostic studies:   No results found.   Medical Consultants:   None.   Subjective:    Carla Henderson relates she has a headache  some nausea  Objective:    Vitals:   05/09/21 0200 05/09/21 0249 05/09/21 0303 05/09/21 0507  BP: 121/78 124/90  113/70  Pulse: 60 64  73  Resp: 11 16  14   Temp:  97.9 F (36.6 C)  (!) 97.4 F (36.3 C)  TempSrc:  Oral  Oral  SpO2: 97% 98%  97%  Weight:   94.8 kg   Height:   5' 9"  (1.753 m)    SpO2: 97 %   Intake/Output Summary (Last 24 hours) at 05/09/2021 0817 Last data filed at 05/09/2021 0600 Gross per 24 hour  Intake 1577.93 ml  Output 0 ml  Net 1577.93 ml   Filed Weights   05/09/21 0303  Weight: 94.8 kg    Exam: General exam: In no acute distress. Respiratory system: Good air movement and clear to auscultation. Cardiovascular system: S1 & S2 heard, RRR. No JVD. Gastrointestinal system: Abdomen is nondistended, soft and nontender.  Extremities: No pedal edema. Skin: Indurated and erythematous warm to touch left thigh, dressed and packed Psychiatry: Judgement and insight appear normal. Mood & affect appropriate.    Data Reviewed:    Labs: Basic Metabolic Panel: Recent Labs  Lab 05/06/21 1638 05/08/21 2115 05/09/21 0417  NA 138 139 137  K 3.7 3.6 3.6  CL 102 104 106  CO2 27 26 25   GLUCOSE 94 90 111*  BUN 10 11 8   CREATININE 0.67 0.65 0.62  CALCIUM 9.1 9.1 8.2*   GFR Estimated Creatinine Clearance: 124.8 mL/min (by C-G formula based on SCr of 0.62 mg/dL). Liver Function Tests: No results for input(s): AST, ALT, ALKPHOS, BILITOT, PROT, ALBUMIN in the last 168 hours. No results for input(s): LIPASE, AMYLASE in the last 168 hours. No results for input(s): AMMONIA in the last 168 hours. Coagulation profile No results for input(s): INR, PROTIME in the last 168 hours. COVID-19 Labs  No results for input(s): DDIMER, FERRITIN, LDH, CRP in the last 72 hours.  Lab Results  Component Value Date   SARSCOV2NAA NEGATIVE 05/09/2021   SARSCOV2NAA RESULT: NEGATIVE 02/21/2021   [place] NEGATIVE 09/06/2020   [place] Not Detected 03/13/2019     CBC: Recent Labs  Lab 05/06/21 1638 05/08/21 2115 05/09/21 0417  WBC 8.6 5.5 5.3  NEUTROABS  --  3.6  --   HGB 12.5 10.9* 10.1*  HCT 37.4 33.2* 30.2*  MCV 88.2 89.0 89.6  PLT 199 198 165   Cardiac Enzymes: No results for input(s): CKTOTAL, CKMB, CKMBINDEX, TROPONINI in the last 168 hours. BNP (last 3 results) No results for input(s): PROBNP in the last 8760 hours. CBG: No results for input(s): GLUCAP in the last 168 hours. D-Dimer: No results for input(s): DDIMER in the last 72 hours. Hgb A1c: No results for input(s): HGBA1C in the last 72 hours. Lipid Profile: No results for input(s): CHOL, HDL, LDLCALC, TRIG, CHOLHDL, LDLDIRECT in the last 72 hours. Thyroid function studies: No results for input(s): TSH, T4TOTAL, T3FREE, THYROIDAB in the last 72 hours.  Invalid input(s): FREET3 Anemia work up: No results for input(s): VITAMINB12, FOLATE, FERRITIN, TIBC, IRON, RETICCTPCT in the last 72 hours. Sepsis Labs: Recent Labs  Lab 05/06/21 1638 05/08/21 2115 05/08/21 2234 05/09/21 0417  WBC 8.6 5.5  --  5.3  LATICACIDVEN  --   --  0.5  --    Microbiology Recent Results (from the past 240 hour(s))  Resp Panel by RT-PCR (Flu A&B, Covid) Nasopharyngeal Swab     Status: None   Collection Time: 05/09/21 12:17 AM   Specimen: Nasopharyngeal Swab; Nasopharyngeal(NP) swabs in vial transport medium  Result Value Ref Range Status   SARS Coronavirus 2 by RT PCR NEGATIVE NEGATIVE Final    Comment: (NOTE) SARS-CoV-2 target nucleic acids are NOT DETECTED.  The SARS-CoV-2 RNA is generally detectable in upper respiratory specimens during the acute phase of infection. The lowest concentration of SARS-CoV-2 viral copies this assay can detect is 138 copies/mL. A negative result does not preclude SARS-Cov-2 infection and should not be used as the sole basis for treatment or other patient management decisions. A negative result may occur with  improper specimen collection/handling,  submission of specimen other than nasopharyngeal swab, presence of viral mutation(s) within the areas targeted by this assay, and inadequate number of viral copies(<138 copies/mL). A negative result must be combined with clinical observations, patient history, and epidemiological information. The expected result is Negative.  Fact Sheet for Patients:  EntrepreneurPulse.com.au  Fact Sheet for Healthcare Providers:  IncredibleEmployment.be  This test is no t yet approved or cleared by the [place] FDA and  has been authorized for detection and/or diagnosis of SARS-CoV-2 by FDA under an Emergency Use Authorization (EUA). This EUA will remain  in effect (meaning this test can be used) for the duration of the COVID-19 declaration under Section 564(b)(1) of the Act, 21  U.S.C.section 360bbb-3(b)(1), unless the authorization is terminated  or revoked sooner.       Influenza A by PCR NEGATIVE NEGATIVE Final   Influenza B by PCR NEGATIVE NEGATIVE Final    Comment: (NOTE) The Xpert Xpress SARS-CoV-2/FLU/RSV plus assay is intended as an aid in the diagnosis of influenza from Nasopharyngeal swab specimens and should not be used as a sole basis for treatment. Nasal washings and aspirates are unacceptable for Xpert Xpress SARS-CoV-2/FLU/RSV testing.  Fact Sheet for Patients: EntrepreneurPulse.com.au  Fact Sheet for Healthcare Providers: IncredibleEmployment.be  This test is not yet approved or cleared by the [place] FDA and has been authorized for detection and/or diagnosis of SARS-CoV-2 by FDA under an Emergency Use Authorization (EUA). This EUA will remain in effect (meaning this test can be used) for the duration of the COVID-19 declaration under Section 564(b)(1) of the Act, 21 U.S.C. section 360bbb-3(b)(1), unless the authorization is terminated or revoked.  Performed at [facility],  [address], [place], [place] [address]      Medications:    Continuous Infusions:  lactated ringers 50 mL/hr at 05/09/21 0538   piperacillin-tazobactam (ZOSYN)  IV 3.375 g (05/09/21 0545)   vancomycin        LOS: 0 days   Carla Henderson  [facility]  05/09/2021, 8:17 AM

## 2021-05-09 NOTE — ED Notes (Signed)
[facility] at bedside to transport patient.  Pt stable.  Vancomycin infusing on transfer.

## 2021-05-09 NOTE — H&P (Signed)
History and Physical    Carla Henderson EQA:[id number] DOB: [birth year] DOA: 05/08/2021  PCP: [facility] [place]   Patient coming from: Home  Chief Complaint: Pain and redness left thigh.   HPI: Carla Henderson is a 31 y.o. female with medical history significant for c section two months ago who was seen 3 days ago in ER for abscess in her left upper thigh. I&D performed and abscess cavity was packed. Was given IV clindamycin in ER and sent home with clindamycin and doxycycline which she has been taking.  Reports that the redness has grown larger in her left thigh and she has had increased pain for the last 2 days.  She reports she has had headaches and subjective fever and chills for the last 2 days.  She has a decreased appetite but no vomiting or diarrhea.  Pain in her thigh is worsened with ambulation or touching of her thigh.  Redness is spread down her leg and over the lateral aspect of her distal left thigh.  ED Course:  Carla Henderson has been hemodynamically stable and afebrile.  She has worsening erythema and induration of her left thigh that has increased since she was seen 3 days ago.  Lactic acid 0.5 CBC shows WBC of 5500 hemoglobin 10.9 hematocrit 33.2 platelets 198,000 BMP within normal limits.  COVID-negative.  Influenza A and B are negative.  Blood cultures were obtained in the emergency room.  Was started on vancomycin and Zosyn in the emergency room.  Hospitalist service asked to admit for further management  Review of Systems:  General: Denies fever, chills, weight loss, night sweats.  Denies dizziness.  HENT: Denies head trauma, headache, denies tinnitus. Denies nasal bleeding. Denies sore throat Eyes: Denies blurry vision, pain in eye, drainage.  Denies discoloration of eyes. Neck: Denies pain.  Denies swelling.  Denies pain with movement. Cardiovascular: Denies chest pain, palpitations. Denies edema.  Denies orthopnea Respiratory: Denies shortness of breath, cough. Denies  wheezing.  Denies sputum production Gastrointestinal: Denies abdominal pain, swelling.  Denies nausea, vomiting, diarrhea.   Musculoskeletal: Left upper thigh pain. Denies limitation of movement.  Denies deformity.  Genitourinary: Denies pelvic pain.  Denies urinary frequency or hesitancy.  Denies dysuria.  Skin: Has redness of left thigh extending to level of knee. Denies rash. Denies petechiae, purpura, ecchymosis. Neurological: Denies syncope. Denies seizure activity. Denies slurred speech.Denies visual change. Psychiatric: Denies depression, anxiety.   Past Medical History:  Diagnosis Date   Abnormal Pap smear    Anemia    Bronchitis    x 1 time   Gestational diabetes    HSV (herpes simplex virus) anogenital infection    Infertility, female    Ovarian cyst    PCOS (polycystic ovarian syndrome)    Spinal headache    UTI (lower urinary tract infection)    Vaginal Pap smear, abnormal     Past Surgical History:  Procedure Laterality Date   CERVICAL CERCLAGE     CERVICAL CERCLAGE N/A 09/13/2017   Procedure: CERCLAGE CERVICAL;  Surgeon: Crawford Givens, MD;  Location: [facility];  Service: Gynecology;  Laterality: N/A;   CERVICAL CERCLAGE N/A 09/08/2020   Procedure: CERCLAGE CERVICAL;  Surgeon: Crawford Givens, MD;  [facility];  Service: Gynecology;  Laterality: N/A;   CESAREAN SECTION N/A 02/22/2021   Procedure: CESAREAN SECTION;  Surgeon: Christophe Louis, MD;  Location: [facility];  Service: Obstetrics;  Laterality: N/A;   CHOLECYSTECTOMY     LAPAROSCOPIC CHOLECYSTECTOMY SINGLE SITE WITH  INTRAOPERATIVE CHOLANGIOGRAM N/A 03/01/2017   Procedure: LAPAROSCOPIC CHOLECYSTECTOMY SINGLE SITE WITH INTRAOPERATIVE CHOLANGIOGRAM;  Surgeon: Michael Boston, MD;  Location: [facility];  Service: General;  Laterality: N/A;   LAPAROSCOPIC OVARIAN CYSTECTOMY Left 01/07/2015   Procedure: Attempted LAPAROSCOPIC OVARIAN CYSTECTOMY, Mini laparotomy with excision of left ovarian cyst;  Surgeon: Mora Bellman, MD;   Location: [facility];  Service: Gynecology;  Laterality: Left;  Requested 01-26-15 @ 3:30p   WISDOM TOOTH EXTRACTION      Social History  reports that she has never smoked. She has never used smokeless tobacco. She reports that she does not currently use alcohol. She reports that she does not use drugs.  Allergies  Allergen Reactions   Vicodin [Hydrocodone-Acetaminophen] Nausea And Vomiting and Other (See Comments)    Pt states does not cause swelling    Family History  Problem Relation Age of Onset   Hypertension Mother    Healthy Father    Hypertension Paternal Grandmother    Diabetes Paternal Grandfather    Other Neg Hx      Prior to Admission medications   Medication Sig Start Date End Date Taking? Authorizing Provider  Acetaminophen 325 MG/10.15ML SUSP Take 20 mLs (640.3941 mg total) by mouth every 6 (six) hours as needed (moderate pain, do not take with percocet maximum dose 3000 mg in 24 hours). 02/24/21   Sanjuana Kava, MD  Cholecalciferol (VITAMIN D) 50 MCG (2000 UT) CAPS Take 2,000 Units by mouth daily.    [provider]  clindamycin (CLEOCIN) 300 MG capsule Take 1 capsule (300 mg total) by mouth 3 (three) times daily for 7 days. 05/06/21 05/13/21  Luna Fuse, MD  ibuprofen (ADVIL) 600 MG tablet Take 1 tablet (600 mg total) by mouth every 6 (six) hours as needed for cramping or moderate pain. 02/24/21   Sanjuana Kava, MD  magnesium oxide (MAG-OX) 400 MG tablet Take 400 mg by mouth daily.    [provider]  oxyCODONE-acetaminophen (PERCOCET/ROXICET) 5-325 MG tablet Take 2 tablets by mouth every 4 (four) hours as needed for severe pain (DO NOT TAKE WITH REGULAR TYLENOL). 02/24/21   Sanjuana Kava, MD  Prenatal Vit-Fe Fumarate-FA (PRENATAL MULTIVITAMIN) TABS tablet Take 1 tablet by mouth daily at 12 noon.    [provider]  senna-docusate (SENOKOT-S) 8.6-50 MG tablet Take 2 tablets by mouth at bedtime as needed for mild constipation. 02/24/21   Sanjuana Kava, MD     Physical Exam: Vitals:   05/09/21 0130 05/09/21 0200 05/09/21 0249 05/09/21 0303  BP: 125/71 121/78 124/90   Pulse: 67 60 64   Resp: 11 11 16    Temp:   97.9 F (36.6 C)   TempSrc:   Oral   SpO2: 97% 97% 98%   Weight:    94.8 kg  Height:    5' 9"  (1.753 m)    Constitutional: NAD, calm, comfortable Vitals:   05/09/21 0130 05/09/21 0200 05/09/21 0249 05/09/21 0303  BP: 125/71 121/78 124/90   Pulse: 67 60 64   Resp: 11 11 16    Temp:   97.9 F (36.6 C)   TempSrc:   Oral   SpO2: 97% 97% 98%   Weight:    94.8 kg  Height:    5' 9"  (1.753 m)   General: WDWN, Alert and oriented x3.  Eyes: EOMI, PERRL, conjunctivae normal. Sclera nonicteric HENT:  [place]/AT, external ears normal.  Nares patent without epistasis.  Mucous membranes are moist.  Neck: Soft, normal range of motion, supple,Trachea midline  Respiratory: clear to auscultation bilaterally, no wheezing, no crackles. Normal respiratory effort.  Cardiovascular: Regular rate and rhythm, no murmurs / rubs / gallops. No extremity edema. 2+ pedal pulses. Abdomen: Soft, no tenderness, nondistended, no rebound or guarding. Bowel sounds normoactive Musculoskeletal: FROM. no cyanosis. No joint deformity upper and lower extremities. Normal muscle tone.  Skin: Warm, dry, intact. Left upper thigh with I&D site with surrounding erythema, induration. Erythema extends down leg to just above the knee.  Neurologic: CN 2-12 grossly intact.  Normal speech.  Sensation intact to touch Psychiatric: Normal judgment and insight.  Normal mood.    Labs on Admission: I have personally reviewed following labs and imaging studies  CBC: Recent Labs  Lab 05/06/21 1638 05/08/21 2115  WBC 8.6 5.5  NEUTROABS  --  3.6  HGB 12.5 10.9*  HCT 37.4 33.2*  MCV 88.2 89.0  PLT 199 370    Basic Metabolic Panel: Recent Labs  Lab 05/06/21 1638 05/08/21 2115  NA 138 139  K 3.7 3.6  CL 102 104  CO2 27 26  GLUCOSE 94 90  BUN 10 11  CREATININE 0.67 0.65   CALCIUM 9.1 9.1    GFR: Estimated Creatinine Clearance: 124.8 mL/min (by C-G formula based on SCr of 0.65 mg/dL).  Liver Function Tests: No results for input(s): AST, ALT, ALKPHOS, BILITOT, PROT, ALBUMIN in the last 168 hours.  Urine analysis:    Component Value Date/Time   COLORURINE YELLOW 02/05/2021 0550   APPEARANCEUR CLEAR 02/05/2021 0550   LABSPEC 1.012 02/05/2021 0550   PHURINE 6.0 02/05/2021 0550   GLUCOSEU NEGATIVE 02/05/2021 0550   HGBUR SMALL (A) 02/05/2021 0550   BILIRUBINUR NEGATIVE 02/05/2021 0550   KETONESUR NEGATIVE 02/05/2021 0550   PROTEINUR NEGATIVE 02/05/2021 0550   UROBILINOGEN 0.2 09/27/2015 0809   NITRITE NEGATIVE 02/05/2021 0550   LEUKOCYTESUR NEGATIVE 02/05/2021 0550    Radiological Exams on Admission: No results found.  EKG: Independently reviewed.  EKG shows normal sinus rhythm with no acute ST elevation or depression.  QTc 419  Assessment/Plan Principal Problem:   Cellulitis of left lower extremity Ms. Saperstein is admitted to [facility] floor. She had been on antibiotic as outpatient with clindamycin but had worsening of cellulitis and continued drainage from I&D site.  Placed on Zosyn and Vancomycin in ER. Culture obtained in the ER and will be monitored.  Given Tramadol for pain control.  Will have wound care evaluate I&D site and pack in am.   DVT prophylaxis: Padua score low. Early ambulation for DVT prophylaxis  Code Status:   Full Code  Family Communication:  Diagnosis and plan discussed with patient.  Questions answered.  Patient verbalized understanding agrees with plan.  Further recommendations to follow as clinically indicated Disposition Plan:   Patient is from:  Home  Anticipated DC to:  Home  Anticipated DC date:  Anticipate 2 midnight stay in hospital  Time Spent:   55 minutes  Admission status:  Inpatient  Yevonne Aline Yedidya Duddy MD [facility]  How to contact the [facility] Attending or Consulting provider [place] or covering  provider during after hours [place], for this patient?   Check the care team in [facility] and look for a) attending/consulting [facility] provider listed and b) the [facility] team listed Log into [email or web address] and use [place]'s universal password to access. If you do not have the password, please contact the hospital operator. Locate the [facility] provider you are looking for under [facility] and page to a number  that you can be directly reached. If you still have difficulty reaching the provider, please page the [facility] (Director on Call) for the Hospitalists listed on amion for assistance.  05/09/2021, 4:04 AM

## 2021-05-10 NOTE — Plan of Care (Signed)
  Problem: Coping: Goal: Level of anxiety will decrease Outcome: Progressing   Problem: Pain Managment: Goal: General experience of comfort will improve Outcome: Progressing   

## 2021-05-10 NOTE — Consult Note (Signed)
Consult Note  HATSUMI STEINHART May 19, 1990  [id number].    Requesting MD: Dr. Charlynne Cousins Chief Complaint/Reason for Consult: LLE cellulitis and abscess HPI:  Patient is a 31 year old female who presented to [facility] with pain and erythema of LLE. Patient had been seen 3 days prior and underwent I&D and packing with iodoform at that time. She was discharged home on clindamycin and doxycycline. She reported worsening redness extending towards her knee and medial aspect of thigh. Pain reported as worsening and patient also reported subjective fever, chills and headache at home. Patient was admitted to the hospitalist service and started on IV abx. General surgery consulted for concern of enlargement of fluctuant area; however, she states that her pain and the hardness she has is gradually improving.   PMH significant for HSV infection and PCOS. Patient had recent cesarean section 2 months ago. Allergy listed to vicodin although reaction is n/v. She is not on any blood thinners.    ROS: ROS: Please see HPI, otherwise all other systems have been reviewed and are negative.  Family History  Problem Relation Age of Onset   Hypertension Mother    Healthy Father    Hypertension Paternal Grandmother    Diabetes Paternal Grandfather    Other Neg Hx     Past Medical History:  Diagnosis Date   Abnormal Pap smear    Anemia    Bronchitis    x 1 time   Gestational diabetes    HSV (herpes simplex virus) anogenital infection    Infertility, female    Ovarian cyst    PCOS (polycystic ovarian syndrome)    Spinal headache    UTI (lower urinary tract infection)    Vaginal Pap smear, abnormal     Past Surgical History:  Procedure Laterality Date   CERVICAL CERCLAGE     CERVICAL CERCLAGE N/A 09/13/2017   Procedure: CERCLAGE CERVICAL;  Surgeon: Crawford Givens, MD;  Location: [facility];  Service: Gynecology;  Laterality: N/A;   CERVICAL CERCLAGE N/A 09/08/2020   Procedure: CERCLAGE  CERVICAL;  Surgeon: Crawford Givens, MD;  [facility];  Service: Gynecology;  Laterality: N/A;   CESAREAN SECTION N/A 02/22/2021   Procedure: CESAREAN SECTION;  Surgeon: Christophe Louis, MD;  Location: [facility];  Service: Obstetrics;  Laterality: N/A;   CHOLECYSTECTOMY     LAPAROSCOPIC CHOLECYSTECTOMY SINGLE SITE WITH INTRAOPERATIVE CHOLANGIOGRAM N/A 03/01/2017   Procedure: LAPAROSCOPIC CHOLECYSTECTOMY SINGLE SITE WITH INTRAOPERATIVE CHOLANGIOGRAM;  Surgeon: Michael Boston, MD;  Location: [facility];  Service: General;  Laterality: N/A;   LAPAROSCOPIC OVARIAN CYSTECTOMY Left 01/07/2015   Procedure: Attempted LAPAROSCOPIC OVARIAN CYSTECTOMY, Mini laparotomy with excision of left ovarian cyst;  Surgeon: Mora Bellman, MD;  Location: [facility];  Service: Gynecology;  Laterality: Left;  Requested 01-26-15 @ 3:30p   WISDOM TOOTH EXTRACTION      Social History:  reports that she has never smoked. She has never used smokeless tobacco. She reports that she does not currently use alcohol. She reports that she does not use drugs.  Allergies:  Allergies  Allergen Reactions   Vicodin [Hydrocodone-Acetaminophen] Nausea And Vomiting and Other (See Comments)    Pt states does not cause swelling    Medications Prior to Admission  Medication Sig Dispense Refill   acetaminophen (TYLENOL) 500 MG tablet Take 500 mg by mouth every 6 (six) hours as needed for mild pain.     Cephalexin 500 MG tablet Take 500 mg by mouth 2 (  two) times daily.     clindamycin (CLEOCIN) 300 MG capsule Take 1 capsule (300 mg total) by mouth 3 (three) times daily for 7 days. 21 capsule 0   doxycycline (VIBRAMYCIN) 100 MG capsule Take 100 mg by mouth 2 (two) times daily.     Prenatal Vit-Fe Fumarate-FA (PRENATAL MULTIVITAMIN) TABS tablet Take 1 tablet by mouth daily at 12 noon.     valACYclovir (VALTREX) 1000 MG tablet Take 1,000 mg by mouth daily as needed (flare up).      Blood pressure 131/80, pulse 68, temperature 97.6 F (36.4 C),  temperature source Oral, resp. rate 14, height 5' 9"  (1.753 m), weight 94.8 kg, last menstrual period 05/25/2020, SpO2 98 %, not currently breastfeeding. Physical Exam:  General: pleasant, WD, WN female who is laying in bed in NAD HEENT: head is normocephalic, atraumatic.  Sclera are noninjected.  PERRL.  Ears and nose without any masses or lesions.  Mouth is pink and moist MS: all 4 extremities are symmetrical with no cyanosis, clubbing, or edema. Except Left thigh with some cellulitis but appears improving from lines drawn.  At the most proximal portion of her thigh near the inguinal crease, she has about a 7cm circumferential area of induration.  In the central aspect of this is an open wound with packing present.  This was removed with no purulent drainage note.  The wound is clean.  Packing was replaced.  The surrounding skin appears to be more "wrinkled" c/w less edema and the healing process.  No further areas of undrained infection noted; Skin: warm and dry with no masses, lesions, or rashes Neuro: Cranial nerves 2-12 grossly intact, sensation is normal throughout Psych: A&Ox3 with an appropriate affect.   Results for orders placed or performed during the hospital encounter of 05/08/21 (from the past 48 hour(s))  CBC with Differential     Status: Abnormal   Collection Time: 05/08/21  9:15 PM  Result Value Ref Range   WBC 5.5 4.0 - 10.5 K/uL   RBC 3.73 (L) 3.87 - 5.11 MIL/uL   Hemoglobin 10.9 (L) 12.0 - 15.0 g/dL   HCT 33.2 (L) 36.0 - 46.0 %   MCV 89.0 80.0 - 100.0 fL   MCH 29.2 26.0 - 34.0 pg   MCHC 32.8 30.0 - 36.0 g/dL   RDW 12.8 11.5 - 15.5 %   Platelets 198 150 - 400 K/uL   nRBC 0.0 0.0 - 0.2 %   Neutrophils Relative % 65 %   Neutro Abs 3.6 1.7 - 7.7 K/uL   Lymphocytes Relative 27 %   Lymphs Abs 1.5 0.7 - 4.0 K/uL   Monocytes Relative 6 %   Monocytes Absolute 0.3 0.1 - 1.0 K/uL   Eosinophils Relative 1 %   Eosinophils Absolute 0.1 0.0 - 0.5 K/uL   Basophils Relative 0 %    Basophils Absolute 0.0 0.0 - 0.1 K/uL   Immature Granulocytes 1 %   Abs Immature Granulocytes 0.03 0.00 - 0.07 K/uL    Comment: Performed at [facility], [place], [place] [address]  Basic metabolic panel     Status: None   Collection Time: 05/08/21  9:15 PM  Result Value Ref Range   Sodium 139 135 - 145 mmol/L   Potassium 3.6 3.5 - 5.1 mmol/L   Chloride 104 98 - 111 mmol/L   CO2 26 22 - 32 mmol/L   Glucose, Bld 90 70 - 99 mg/dL    Comment: Glucose reference range applies  only to samples taken after fasting for at least 8 hours.   BUN 11 6 - 20 mg/dL   Creatinine, Ser 0.65 0.44 - 1.00 mg/dL   Calcium 9.1 8.9 - 10.3 mg/dL   GFR, Estimated >60 >60 mL/min    Comment: (NOTE) Calculated using the CKD-EPI Creatinine Equation (2021)    Anion gap 9 5 - 15    Comment: Performed at [facility], [address] [place], [place] [address]  Blood culture (routine x 2)     Status: None (Preliminary result)   Collection Time: 05/08/21  9:15 PM   Specimen: BLOOD  Result Value Ref Range   Specimen Description      BLOOD Blood Culture adequate volume Performed at [facility], [address] [place], [place] [address]    Special Requests      RIGHT ANTECUBITAL Performed at [facility], [address], [place], [place] [address]    Culture      NO GROWTH < 24 HOURS Performed at [facility], [place] [address], [place], [place] [address]    Report Status PENDING   Lactic acid, plasma     Status: None   Collection Time: 05/08/21 10:34 PM  Result Value Ref Range   Lactic Acid, Venous 0.5 0.5 - 1.9 mmol/L    Comment: Performed at [facility], [address], [place], [place] [address]  Blood culture (routine x 2)     Status: None (Preliminary result)   Collection Time: 05/08/21 11:17 PM   Specimen: BLOOD  Result Value Ref Range   Specimen Description       BLOOD Performed at [facility], [address], [place], [place] [address]    Special Requests      NONE Performed at [facility], [address], [place], [place] [address]    Culture      NO GROWTH < 24 HOURS Performed at [facility], [place] [address], [place], [place] [address]    Report Status PENDING   Resp Panel by RT-PCR (Flu A&B, Covid) Nasopharyngeal Swab     Status: None   Collection Time: 05/09/21 12:17 AM   Specimen: Nasopharyngeal Swab; Nasopharyngeal(NP) swabs in vial transport medium  Result Value Ref Range   SARS Coronavirus 2 by RT PCR NEGATIVE NEGATIVE    Comment: (NOTE) SARS-CoV-2 target nucleic acids are NOT DETECTED.  The SARS-CoV-2 RNA is generally detectable in upper respiratory specimens during the acute phase of infection. The lowest concentration of SARS-CoV-2 viral copies this assay can detect is 138 copies/mL. A negative result does not preclude SARS-Cov-2 infection and should not be used as the sole basis for treatment or other patient management decisions. A negative result may occur with  improper specimen collection/handling, submission of specimen other than nasopharyngeal swab, presence of viral mutation(s) within the areas targeted by this assay, and inadequate number of viral copies(<138 copies/mL). A negative result must be combined with clinical observations, patient history, and epidemiological information. The expected result is Negative.  Fact Sheet for Patients:  EntrepreneurPulse.com.au  Fact Sheet for Healthcare Providers:  IncredibleEmployment.be  This test is no t yet approved or cleared by the [place] FDA and  has been authorized for detection and/or diagnosis of SARS-CoV-2 by FDA under an Emergency Use Authorization (EUA). This EUA will remain  in effect (meaning this test can be used) for the duration of the COVID-19 declaration under  Section 564(b)(1) of the Act, 21  U.S.C.section 360bbb-3(b)(1), unless the authorization is terminated  or revoked sooner.       Influenza A by PCR NEGATIVE NEGATIVE   Influenza B by PCR NEGATIVE NEGATIVE    Comment: (NOTE) The Xpert Xpress SARS-CoV-2/FLU/RSV plus assay is intended as an aid in the diagnosis of influenza from Nasopharyngeal swab specimens and should not be used as a sole basis for treatment. Nasal washings and aspirates are unacceptable for Xpert Xpress SARS-CoV-2/FLU/RSV testing.  Fact Sheet for Patients: EntrepreneurPulse.com.au  Fact Sheet for Healthcare Providers: IncredibleEmployment.be  This test is not yet approved or cleared by the [place] FDA and has been authorized for detection and/or diagnosis of SARS-CoV-2 by FDA under an Emergency Use Authorization (EUA). This EUA will remain in effect (meaning this test can be used) for the duration of the COVID-19 declaration under Section 564(b)(1) of the Act, 21 U.S.C. section 360bbb-3(b)(1), unless the authorization is terminated or revoked.  Performed at [facility], [address] [place], [place] [address]   HIV Antibody (routine testing w rflx)     Status: None   Collection Time: 05/09/21  4:17 AM  Result Value Ref Range   HIV Screen 4th Generation wRfx Non Reactive Non Reactive    Comment: Performed at [facility], [address] [place], [place] [address]  CBC     Status: Abnormal   Collection Time: 05/09/21  4:17 AM  Result Value Ref Range   WBC 5.3 4.0 - 10.5 K/uL   RBC 3.37 (L) 3.87 - 5.11 MIL/uL   Hemoglobin 10.1 (L) 12.0 - 15.0 g/dL   HCT 30.2 (L) 36.0 - 46.0 %   MCV 89.6 80.0 - 100.0 fL   MCH 30.0 26.0 - 34.0 pg   MCHC 33.4 30.0 - 36.0 g/dL   RDW 12.7 11.5 - 15.5 %   Platelets 165 150 - 400 K/uL   nRBC 0.0 0.0 - 0.2 %    Comment: Performed at [facility], [place] [address], [place], [place] [address]   Basic metabolic panel     Status: Abnormal   Collection Time: 05/09/21  4:17 AM  Result Value Ref Range   Sodium 137 135 - 145 mmol/L   Potassium 3.6 3.5 - 5.1 mmol/L   Chloride 106 98 - 111 mmol/L   CO2 25 22 - 32 mmol/L   Glucose, Bld 111 (H) 70 - 99 mg/dL    Comment: Glucose reference range applies only to samples taken after fasting for at least 8 hours.   BUN 8 6 - 20 mg/dL   Creatinine, Ser 0.62 0.44 - 1.00 mg/dL   Calcium 8.2 (L) 8.9 - 10.3 mg/dL   GFR, Estimated >60 >60 mL/min    Comment: (NOTE) Calculated using the CKD-EPI Creatinine Equation (2021)    Anion gap 6 5 - 15    Comment: Performed at [facility], [place] [address] [place], [place] [address]   No results found.    Assessment/Plan Cellulitis of LLE with abscess - s/p I&D in ED 1/13  - on cefepime/vanc, no leukocytosis and afebrile  - WOC saw yesterday and recommended removal of packing and repacking with iodoform and changing daily  - no further needs for surgical debridement.  Her leg is overall improving and her wound is clean.  Continue local wound care and abx therapy. -if continues to improve, would be ok to transition to oral abx and hopefully home soon, but will defer to the medical service for  their timing of this.   -findings relayed to primary service. -we are available if needed.   FEN: regular diet, IVF per [facility] VTE: none currently, ok for chemical prophylaxis from a surgical standpoint ID: [email or web address]/vanc  HSV infection  PCOS  I reviewed ED provider notes, hospitalist notes, last 24 h vitals and pain scores, last 48 h intake and output, last 24 h labs and trends, and last 24 h imaging results.  This care required moderate level of medical decision making.   Henreitta Cea, [facility] Surgery 05/10/2021, 10:06 AM Please see Amion for pager number during day hours 7:00am-4:30pm

## 2021-05-10 NOTE — Progress Notes (Signed)
[facility] PROGRESS NOTE    Progress Note  Carla Henderson  TKP:[id number] DOB: 19-May-1990 DOA: 05/08/2021 PCP: [facility]     Brief Narrative:   Carla Henderson is an 31 y.o. female past medical history significant for C-section 2 months ago was seen in the ED 3 days prior to admission for an abscess of her left upper thigh I&D was performed and packed started on clindamycin comes back to the hospital for worsening erythema and pain, will start on vancomycin and Zosyn on admission    Assessment/Plan:   Cellulitis of left lower extremity T-max of 98.6. Blood cultures remain negative till date. Ibuprofen and Tylenol for pain and fever. Abscesses enlarged and a bigger fluctuation induration is improving as well as erythema. She relates he feels much better than yesterday..       DVT prophylaxis: lovenox Family Communication:none Status is: Inpatient  Remains inpatient appropriate because: Lower extremity cellulitis        Code Status:     Code Status Orders  (From admission, onward)           Start     Ordered   05/09/21 0401  Full code  Continuous        05/09/21 0403           Code Status History     Date Active Date Inactive Code Status Order ID Comments User Context   02/22/2021 1703 02/22/2021 2335 Full Code [id number]  Sanjuana Kava, MD Inpatient   03/19/2018 0346 03/21/2018 1631 Full Code [id number]  Noralyn Pick, [place] Inpatient   03/18/2018 0405 03/19/2018 0345 Full Code [id number]  Elnora Morrison, CNM Inpatient   02/28/2017 2008 03/02/2017 1719 Full Code [id number]  Kalman Drape, [place] Inpatient   10/01/2015 0246 10/01/2015 1733 Full Code [id number]  Tresea Mall, CNM Inpatient   01/07/2015 1719 01/08/2015 1340 Full Code [id number]  Constant, Vickii Chafe, MD Inpatient         IV Access:   Peripheral IV   Procedures and diagnostic studies:   No results found.   Medical Consultants:   None.   Subjective:    Carla Henderson relates she has a headache some nausea  Objective:    Vitals:   05/09/21 1017 05/09/21 1316 05/09/21 2106 05/10/21 0511  BP: 120/74 (!) 142/90 135/89 131/80  Pulse: 66 82 70 68  Resp: 18 18 16 14   Temp: 98 F (36.7 C) 98.6 F (37 C) 98.5 F (36.9 C) 97.6 F (36.4 C)  TempSrc: Oral Oral Oral Oral  SpO2: 100% 98% 99% 98%  Weight:      Height:       SpO2: 98 %   Intake/Output Summary (Last 24 hours) at 05/10/2021 0951 Last data filed at 05/10/2021 0600 Gross per 24 hour  Intake 2715.29 ml  Output 5150 ml  Net -2434.71 ml    Filed Weights   05/09/21 0303  Weight: 94.8 kg    Exam: General exam: In no acute distress. Respiratory system: Good air movement and clear to auscultation. Cardiovascular system: S1 & S2 heard, RRR. No JVD. Gastrointestinal system: Abdomen is nondistended, soft and nontender.  Extremities: No pedal edema. Skin: Indurated and erythematous warm to touch left thigh, dressed and packed Psychiatry: Judgement and insight appear normal. Mood & affect appropriate.    Data Reviewed:    Labs: Basic Metabolic Panel: Recent Labs  Lab 05/06/21 1638 05/08/21 2115 05/09/21 0417  NA 138 139 137  K 3.7  3.6 3.6  CL 102 104 106  CO2 27 26 25   GLUCOSE 94 90 111*  BUN 10 11 8   CREATININE 0.67 0.65 0.62  CALCIUM 9.1 9.1 8.2*    GFR Estimated Creatinine Clearance: 124.8 mL/min (by C-G formula based on SCr of 0.62 mg/dL). Liver Function Tests: No results for input(s): AST, ALT, ALKPHOS, BILITOT, PROT, ALBUMIN in the last 168 hours. No results for input(s): LIPASE, AMYLASE in the last 168 hours. No results for input(s): AMMONIA in the last 168 hours. Coagulation profile No results for input(s): INR, PROTIME in the last 168 hours. COVID-19 Labs  No results for input(s): DDIMER, FERRITIN, LDH, CRP in the last 72 hours.  Lab Results  Component Value Date   SARSCOV2NAA NEGATIVE 05/09/2021   SARSCOV2NAA RESULT: NEGATIVE 02/21/2021    [place] NEGATIVE 09/06/2020   [place] Not Detected 03/13/2019    CBC: Recent Labs  Lab 05/06/21 1638 05/08/21 2115 05/09/21 0417  WBC 8.6 5.5 5.3  NEUTROABS  --  3.6  --   HGB 12.5 10.9* 10.1*  HCT 37.4 33.2* 30.2*  MCV 88.2 89.0 89.6  PLT 199 198 165    Cardiac Enzymes: No results for input(s): CKTOTAL, CKMB, CKMBINDEX, TROPONINI in the last 168 hours. BNP (last 3 results) No results for input(s): PROBNP in the last 8760 hours. CBG: No results for input(s): GLUCAP in the last 168 hours. D-Dimer: No results for input(s): DDIMER in the last 72 hours. Hgb A1c: No results for input(s): HGBA1C in the last 72 hours. Lipid Profile: No results for input(s): CHOL, HDL, LDLCALC, TRIG, CHOLHDL, LDLDIRECT in the last 72 hours. Thyroid function studies: No results for input(s): TSH, T4TOTAL, T3FREE, THYROIDAB in the last 72 hours.  Invalid input(s): FREET3 Anemia work up: No results for input(s): VITAMINB12, FOLATE, FERRITIN, TIBC, IRON, RETICCTPCT in the last 72 hours. Sepsis Labs: Recent Labs  Lab 05/06/21 1638 05/08/21 2115 05/08/21 2234 05/09/21 0417  WBC 8.6 5.5  --  5.3  LATICACIDVEN  --   --  0.5  --     Microbiology Recent Results (from the past 240 hour(s))  Blood culture (routine x 2)     Status: None (Preliminary result)   Collection Time: 05/08/21  9:15 PM   Specimen: BLOOD  Result Value Ref Range Status   Specimen Description   Final    BLOOD Blood Culture adequate volume Performed at [facility], [address] [place], [place] [address]    Special Requests   Final    RIGHT ANTECUBITAL Performed at [facility], [address], [place], [place] [address]    Culture   Final    NO GROWTH < 24 HOURS Performed at [facility], [place] [address], [place], [place] [address]    Report Status PENDING  Incomplete  Blood culture (routine x 2)     Status: None (Preliminary result)   Collection Time:  05/08/21 11:17 PM   Specimen: BLOOD  Result Value Ref Range Status   Specimen Description   Final    BLOOD Performed at [facility], [address], [place], [place] [address]    Special Requests   Final    NONE Performed at [facility], [address] [place], [place] [address]    Culture   Final    NO GROWTH < 24 HOURS Performed at [facility], [place] [address], [place], [place] [address]    Report Status PENDING  Incomplete  Resp Panel  by RT-PCR (Flu A&B, Covid) Nasopharyngeal Swab     Status: None   Collection Time: 05/09/21 12:17 AM   Specimen: Nasopharyngeal Swab; Nasopharyngeal(NP) swabs in vial transport medium  Result Value Ref Range Status   SARS Coronavirus 2 by RT PCR NEGATIVE NEGATIVE Final    Comment: (NOTE) SARS-CoV-2 target nucleic acids are NOT DETECTED.  The SARS-CoV-2 RNA is generally detectable in upper respiratory specimens during the acute phase of infection. The lowest concentration of SARS-CoV-2 viral copies this assay can detect is 138 copies/mL. A negative result does not preclude SARS-Cov-2 infection and should not be used as the sole basis for treatment or other patient management decisions. A negative result may occur with  improper specimen collection/handling, submission of specimen other than nasopharyngeal swab, presence of viral mutation(s) within the areas targeted by this assay, and inadequate number of viral copies(<138 copies/mL). A negative result must be combined with clinical observations, patient history, and epidemiological information. The expected result is Negative.  Fact Sheet for Patients:  EntrepreneurPulse.com.au  Fact Sheet for Healthcare Providers:  IncredibleEmployment.be  This test is no t yet approved or cleared by the [place] FDA and  has been authorized for detection and/or diagnosis of SARS-CoV-2 by FDA under an Emergency Use  Authorization (EUA). This EUA will remain  in effect (meaning this test can be used) for the duration of the COVID-19 declaration under Section 564(b)(1) of the Act, 21 U.S.C.section 360bbb-3(b)(1), unless the authorization is terminated  or revoked sooner.       Influenza A by PCR NEGATIVE NEGATIVE Final   Influenza B by PCR NEGATIVE NEGATIVE Final    Comment: (NOTE) The Xpert Xpress SARS-CoV-2/FLU/RSV plus assay is intended as an aid in the diagnosis of influenza from Nasopharyngeal swab specimens and should not be used as a sole basis for treatment. Nasal washings and aspirates are unacceptable for Xpert Xpress SARS-CoV-2/FLU/RSV testing.  Fact Sheet for Patients: EntrepreneurPulse.com.au  Fact Sheet for Healthcare Providers: IncredibleEmployment.be  This test is not yet approved or cleared by the [place] FDA and has been authorized for detection and/or diagnosis of SARS-CoV-2 by FDA under an Emergency Use Authorization (EUA). This EUA will remain in effect (meaning this test can be used) for the duration of the COVID-19 declaration under Section 564(b)(1) of the Act, 21 U.S.C. section 360bbb-3(b)(1), unless the authorization is terminated or revoked.  Performed at [facility], [address] [place], [place] [address]      Medications:    Continuous Infusions:  ceFEPime (MAXIPIME) IV 2 g (05/10/21 9371)   lactated ringers 50 mL/hr at 05/09/21 0538   vancomycin 1,250 mg (05/09/21 2106)      LOS: 1 day   Charlynne Cousins  [facility]  05/10/2021, 9:51 AM

## 2021-05-10 NOTE — Progress Notes (Signed)
Left upper leg wound packed with iodoform 1/4 in gauze and Aquacel Advantage/dry gauze dressing applied per order. Patient tolerated well.

## 2021-05-11 MED ORDER — SULFAMETHOXAZOLE-TRIMETHOPRIM 800-160 MG PO TABS: 1.0000 | ORAL_TABLET | Freq: Two times a day (BID) | ORAL | 0 refills | Status: AC

## 2021-05-11 MED ORDER — SULFAMETHOXAZOLE-TRIMETHOPRIM 800-160 MG PO TABS
1.0000 | ORAL_TABLET | Freq: Two times a day (BID) | ORAL | Status: DC
  Filled 2021-05-11: qty 1

## 2021-05-11 NOTE — Progress Notes (Signed)
Discharge instructions given to patient and all questions were answered. RN taught patient how to pack wound and patient given wound care supplies, and all questions were answered.

## 2021-05-11 NOTE — Discharge Summary (Signed)
DISCHARGE SUMMARY  BETHLEHEM LANGSTAFF  MR#: [id number]  DOB:[birth year]  Date of Admission: 05/08/2021 Date of Discharge: 05/11/2021  Attending Physician:Bodie Abernethy Hennie Duos, MD  Patient's UYQ:[id number], Carla Henderson Medical  Consults: Gen Surgery   Disposition: D/C home   Follow-up Appts:  [facility] Follow up in 1 week(s).   Contact information: [address] [place] [address] [phone]                 Tests Needing Follow-up: -assess L LE wound to assure healing is progressing   Discharge Diagnoses: L LE cellulitis w/ abscess (failed outpatient tx)  Initial presentation: 31 year old with a history of C-section delivery 2 months ago who was seen in the ED 3 days prior to this admission and underwent I&D of an abscess of her left upper thigh.  She returned to the hospital 05/08/2021 with worsening erythema and pain despite ongoing use of oral clindamycin and doxycycline.  Hospital Course: Evaluated by General Surgery who did not feel that further I&D was indicated - has improved with broad-spectrum IV antibiotic - packing present at admission has been removed and the wound has been cleansed with subsequent replacement iodoform packing - pt educated on wound care by RN prior to d/c - erythema was resolved and wound is not sore - pt feels she has improved significantly and is comfortable w/ d/c home - counseled on need to monitor wound and keep it clean - counseled on warning signs of recurrent/worsening infection and need to seek immediate medical attention should this arise - counseled on need to comply w/ full course of abx tx   Allergies as of 05/11/2021       Reactions   Vicodin [hydrocodone-acetaminophen] Nausea And Vomiting, Other (See Comments)   Pt states does not cause swelling        Medication List     STOP taking these medications    Cephalexin 500 MG tablet   clindamycin 300 MG capsule Commonly known as:  Cleocin   doxycycline 100 MG capsule Commonly known as: VIBRAMYCIN       TAKE these medications    acetaminophen 500 MG tablet Commonly known as: TYLENOL Take 500 mg by mouth every 6 (six) hours as needed for mild pain.   prenatal multivitamin Tabs tablet Take 1 tablet by mouth daily at 12 noon.   sulfamethoxazole-trimethoprim 800-160 MG tablet Commonly known as: BACTRIM DS Take 1 tablet by mouth every 12 (twelve) hours for 5 days.   valACYclovir 1000 MG tablet Commonly known as: VALTREX Take 1,000 mg by mouth daily as needed (flare up).        Day of Discharge BP (!) 142/97 (BP Location: Right Arm)    Pulse 62    Temp 97.9 F (36.6 C) (Oral)    Resp 15    Ht 5' 9"  (1.753 m)    Wt 94.8 kg    LMP 05/25/2020    SpO2 99%    Breastfeeding No    BMI 30.86 kg/m   Physical Exam: General: No acute respiratory distress Lungs: Clear to auscultation bilaterally without wheezes or crackles Cardiovascular: Regular rate and rhythm without murmur gallop or rub normal S1 and S2 Abdomen: Nontender, nondistended, soft, bowel sounds positive, no rebound, no ascites, no appreciable mass Extremities: No significant cyanosis, clubbing, or edema bilateral lower extremities - no erythema of L thigh wound - wound dressed and dry w/o induration or tenderness to palpation   Basic Metabolic  Panel: Recent Labs  Lab 05/06/21 1638 05/08/21 2115 05/09/21 0417  NA 138 139 137  K 3.7 3.6 3.6  CL 102 104 106  CO2 27 26 25   GLUCOSE 94 90 111*  BUN 10 11 8   CREATININE 0.67 0.65 0.62  CALCIUM 9.1 9.1 8.2*   CBC: Recent Labs  Lab 05/06/21 1638 05/08/21 2115 05/09/21 0417  WBC 8.6 5.5 5.3  NEUTROABS  --  3.6  --   HGB 12.5 10.9* 10.1*  HCT 37.4 33.2* 30.2*  MCV 88.2 89.0 89.6  PLT 199 198 165    Recent Results (from the past 240 hour(s))  Blood culture (routine x 2)     Status: None (Preliminary result)   Collection Time: 05/08/21  9:15 PM   Specimen: BLOOD  Result Value Ref Range  Status   Specimen Description   Final    BLOOD Blood Culture adequate volume Performed at [facility], [address], [place], [place] [address]    Special Requests   Final    RIGHT ANTECUBITAL Performed at [facility], [address], [place], [place] [address]    Culture   Final    NO GROWTH 2 DAYS Performed at [facility], [place] [address], [place], [place] [address]    Report Status PENDING  Incomplete  Blood culture (routine x 2)     Status: None (Preliminary result)   Collection Time: 05/08/21 11:17 PM   Specimen: BLOOD  Result Value Ref Range Status   Specimen Description   Final    BLOOD Performed at [facility], [address], [place], [place] [address]    Special Requests   Final    NONE Performed at [facility], [address] [place], [place] [address]    Culture   Final    NO GROWTH 2 DAYS Performed at [facility], [place] [address] [place], [place] [address]    Report Status PENDING  Incomplete  Resp Panel by RT-PCR (Flu A&B, Covid) Nasopharyngeal Swab     Status: None   Collection Time: 05/09/21 12:17 AM   Specimen: Nasopharyngeal Swab; Nasopharyngeal(NP) swabs in vial transport medium  Result Value Ref Range Status   SARS Coronavirus 2 by RT PCR NEGATIVE NEGATIVE Final    Comment: (NOTE) SARS-CoV-2 target nucleic acids are NOT DETECTED.  The SARS-CoV-2 RNA is generally detectable in upper respiratory specimens during the acute phase of infection. The lowest concentration of SARS-CoV-2 viral copies this assay can detect is 138 copies/mL. A negative result does not preclude SARS-Cov-2 infection and should not be used as the sole basis for treatment or other patient management decisions. A negative result may occur with  improper specimen collection/handling, submission of specimen other than nasopharyngeal swab, presence of viral mutation(s) within the areas  targeted by this assay, and inadequate number of viral copies(<138 copies/mL). A negative result must be combined with clinical observations, patient history, and epidemiological information. The expected result is Negative.  Fact Sheet for Patients:  EntrepreneurPulse.com.au  Fact Sheet for Healthcare Providers:  IncredibleEmployment.be  This test is no t yet approved or cleared by the [place] FDA and  has been authorized for detection and/or diagnosis of SARS-CoV-2 by FDA under an Emergency Use Authorization (EUA). This EUA will remain  in effect (meaning this test can be used) for the duration of the COVID-19 declaration under Section 564(b)(1) of the Act, 21 U.S.C.section 360bbb-3(b)(1), unless the authorization is terminated  or revoked  sooner.       Influenza A by PCR NEGATIVE NEGATIVE Final   Influenza B by PCR NEGATIVE NEGATIVE Final    Comment: (NOTE) The Xpert Xpress SARS-CoV-2/FLU/RSV plus assay is intended as an aid in the diagnosis of influenza from Nasopharyngeal swab specimens and should not be used as a sole basis for treatment. Nasal washings and aspirates are unacceptable for Xpert Xpress SARS-CoV-2/FLU/RSV testing.  Fact Sheet for Patients: EntrepreneurPulse.com.au  Fact Sheet for Healthcare Providers: IncredibleEmployment.be  This test is not yet approved or cleared by the [place] FDA and has been authorized for detection and/or diagnosis of SARS-CoV-2 by FDA under an Emergency Use Authorization (EUA). This EUA will remain in effect (meaning this test can be used) for the duration of the COVID-19 declaration under Section 564(b)(1) of the Act, 21 U.S.C. section 360bbb-3(b)(1), unless the authorization is terminated or revoked.  Performed at [facility], [address], [place], [place] [address]      Time spent in discharge (includes decision  making & examination of pt): 30 minutes  05/11/2021, 11:06 AM   Cherene Altes, MD [facility] Office  [phone]

## 2021-05-11 NOTE — Plan of Care (Signed)

## 2021-05-14 LAB — CULTURE, BLOOD (ROUTINE X 2)
Culture: NO GROWTH
Culture: NO GROWTH
Specimen Description: ADEQUATE

## 2021-05-17 ENCOUNTER — Encounter: Payer: Self-pay | Admitting: Internal Medicine

## 2021-05-17 ENCOUNTER — Other Ambulatory Visit: Payer: Self-pay

## 2021-05-17 ENCOUNTER — Ambulatory Visit (INDEPENDENT_AMBULATORY_CARE_PROVIDER_SITE_OTHER): Payer: Medicaid Other | Admitting: Internal Medicine

## 2021-05-17 VITALS — BP 142/92 | HR 84 | Temp 97.8°F | Ht 69.0 in | Wt 201.0 lb

## 2021-05-17 DIAGNOSIS — S91002S Unspecified open wound, left ankle, sequela: Secondary | ICD-10-CM

## 2021-05-17 DIAGNOSIS — S61402S Unspecified open wound of left hand, sequela: Secondary | ICD-10-CM | POA: Diagnosis not present

## 2021-05-17 DIAGNOSIS — S81002S Unspecified open wound, left knee, sequela: Secondary | ICD-10-CM

## 2021-05-17 DIAGNOSIS — L03116 Cellulitis of left lower limb: Secondary | ICD-10-CM

## 2021-05-17 DIAGNOSIS — S81802S Unspecified open wound, left lower leg, sequela: Secondary | ICD-10-CM

## 2021-05-17 MED ORDER — SULFAMETHOXAZOLE-TRIMETHOPRIM 800-160 MG PO TABS: 1.0000 | ORAL_TABLET | Freq: Two times a day (BID) | ORAL | 0 refills | Status: DC

## 2021-05-17 NOTE — Progress Notes (Signed)
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 female G1P1 via C-section 2 months ago presents for hospital follow-up of left upper thigh cellulitis([place] 1/15-1/18).  She was seen in the ED 2 days prior to admission and underwent I&D of abscess left upper thigh.  She returned to the hospital 115 with worsening erythema and pain despite oral clindamycin doxycycline.  Evaluated by general surgery did not undergo surgical intervention.  She was started on broad-spectrum IV antibiotics(vancomycin  and pip-tazo->cefemine).  She was discharged on Bactrim x5 days. Today 05/17/21: Pt reports she completed bactrim yesterday. She denies fever, chills. Reports her wound  looks significantly better as she initially had erythema down to her knee. She reports the wound started as "pimple" on her inner thigh that opened leading to surrounding erythema.  Review of Systems  All other systems reviewed and are negative.  Past Medical History:  Diagnosis Date   Abnormal Pap smear    Anemia    Bronchitis    x 1 time   Gestational diabetes    HSV (herpes simplex virus) anogenital infection    Infertility, female    Ovarian cyst    PCOS (polycystic ovarian syndrome)    Spinal headache    UTI (lower urinary tract infection)    Vaginal Pap smear, abnormal     Outpatient Medications Prior to Visit  Medication Sig Dispense Refill    acetaminophen (TYLENOL) 500 MG tablet Take 500 mg by mouth every 6 (six) hours as needed for mild pain.     Prenatal Vit-Fe Fumarate-FA (PRENATAL MULTIVITAMIN) TABS tablet Take 1 tablet by mouth daily at 12 noon.     valACYclovir (VALTREX) 1000 MG tablet Take 1,000 mg by mouth daily as needed (flare up).     No facility-administered medications prior to visit.     Allergies  Allergen Reactions   Vicodin [Hydrocodone-Acetaminophen] Nausea And Vomiting and Other (See Comments)    Pt states does not cause swelling    Social History   Tobacco Use   Smoking status: Never   Smokeless tobacco: Never  Vaping Use   Vaping Use: Never used  Substance Use Topics   Alcohol use: Not Currently    Comment: not while rpeg   Drug use: No    Family History  Problem Relation Age of Onset   Hypertension Mother    Healthy Father    Hypertension Paternal Grandmother    Diabetes Paternal Grandfather    Other Neg Hx     Objective:   Vitals:   05/17/21 0905  BP: (!) 142/92  Pulse: 84  Temp: 97.8 F (36.6 C)  TempSrc: Oral  SpO2: 94%  Weight: 201 lb (91.2 kg)  Height: 5' 9"  (1.753 m)   Body mass index is 29.68 kg/m.  Physical Exam Constitutional:      Appearance: Normal appearance.  HENT:     Head: Normocephalic and  atraumatic.     Right Ear: Tympanic membrane normal.     Left Ear: Tympanic membrane normal.     Nose: Nose normal.     Mouth/Throat:     Mouth: Mucous membranes are moist.  Eyes:     Extraocular Movements: Extraocular movements intact.     Conjunctiva/sclera: Conjunctivae normal.     Pupils: Pupils are equal, round, and reactive to light.  Cardiovascular:     Rate and Rhythm: Normal rate and regular rhythm.     Heart sounds: No murmur heard.   No friction rub. No gallop.  Pulmonary:     Effort: Pulmonary effort is normal.     Breath sounds: Normal breath sounds.  Abdominal:     General: Abdomen is flat.     Palpations: Abdomen is soft.  Musculoskeletal:         General: Normal range of motion.  Skin:    General: Skin is warm and dry.     Comments: Left upper thigh 1 cm open wound, with scant amount of purulence. No surrounding erythema or tenderness.   Neurological:     General: No focal deficit present.     Mental Status: She is alert and oriented to person, place, and time.  Psychiatric:        Mood and Affect: Mood normal.    Lab Results: Lab Results  Component Value Date   WBC 5.3 05/09/2021   HGB 10.1 (L) 05/09/2021   HCT 30.2 (L) 05/09/2021   MCV 89.6 05/09/2021   PLT 165 05/09/2021    Lab Results  Component Value Date   CREATININE 0.62 05/09/2021   BUN 8 05/09/2021   NA 137 05/09/2021   K 3.6 05/09/2021   CL 106 05/09/2021   CO2 25 05/09/2021    Lab Results  Component Value Date   ALT 17 02/22/2021   AST 15 02/22/2021   ALKPHOS 90 02/22/2021   BILITOT 0.3 02/22/2021     Assessment & Plan:   Problem List Items Addressed This Visit   None Visit Diagnoses     Open wound of left hand with complication, sequela    -  Primary   Open wound of left knee, leg, and ankle with complication, sequela       Relevant Orders   AMB referral to wound care center      #Left upper thigh wound -small amount of drainage noted. Pt reports wound is significantly improved. -Will Rx additional 4 days of bactrim to complete 2 weeks of antibiotics for complicated skin soft tissue infection -Wound care referral -Follow-up with ID in 1 week.    Laurice Record, MD [facility]   05/17/21  9:24 AM

## 2021-06-09 ENCOUNTER — Encounter (HOSPITAL_BASED_OUTPATIENT_CLINIC_OR_DEPARTMENT_OTHER): Payer: Medicaid Other | Admitting: Internal Medicine

## 2022-02-25 ENCOUNTER — Emergency Department (HOSPITAL_BASED_OUTPATIENT_CLINIC_OR_DEPARTMENT_OTHER)
Admission: EM | Admit: 2022-02-25 | Discharge: 2022-02-25 | Disposition: A | Discharge status: Home / Self Care | Payer: Medicaid Other | Attending: Emergency Medicine | Admitting: Emergency Medicine

## 2022-02-25 ENCOUNTER — Other Ambulatory Visit: Payer: Self-pay

## 2022-02-25 ENCOUNTER — Encounter (HOSPITAL_BASED_OUTPATIENT_CLINIC_OR_DEPARTMENT_OTHER): Payer: Self-pay | Admitting: Emergency Medicine

## 2022-02-25 DIAGNOSIS — H00012 Hordeolum externum right lower eyelid: Secondary | ICD-10-CM | POA: Insufficient documentation

## 2022-02-25 MED ORDER — ERYTHROMYCIN 5 MG/GM OP OINT: TOPICAL_OINTMENT | OPHTHALMIC | 0 refills | Status: AC

## 2022-02-25 MED ORDER — FLUORESCEIN SODIUM 1 MG OP STRP
1.0000 | ORAL_STRIP | Freq: Once | OPHTHALMIC | Status: AC
  Administered 2022-02-25: 1 via OPHTHALMIC
  Filled 2022-02-25: qty 1

## 2022-02-25 MED ORDER — AMOXICILLIN-POT CLAVULANATE 875-125 MG PO TABS: 1.0000 | ORAL_TABLET | Freq: Two times a day (BID) | ORAL | 0 refills | Status: DC

## 2022-02-25 MED ORDER — TETRACAINE HCL 0.5 % OP SOLN
2.0000 [drp] | Freq: Once | OPHTHALMIC | Status: AC
Start: 2022-02-25 — End: 2022-02-25
  Administered 2022-02-25: 2 [drp] via OPHTHALMIC
  Filled 2022-02-25: qty 4

## 2022-02-25 NOTE — ED Triage Notes (Signed)
Rt eye bump that started  1 week ago has some reddness, has another forming in the cornrer of her eye

## 2022-02-25 NOTE — ED Provider Notes (Signed)
[facility] Provider Note   CSN: [id number] Arrival date & time: 02/25/22  8099     History  No chief complaint on file.   Carla Henderson is a 31 y.o. female.  Patient presents with painful bump to her right lower lid for the past 1 week.  No trauma.  Several days ago she noticed a second bump more lateral to her right lower lid.  Denies any change in her vision.  Has been some drainage from her lower lid that she describes as yellow.  No fever, chills, nausea or vomiting.  No glasses or contact use.  No previous history of eye issues.  She has had cellulitis of her leg in the past and became concerned she was having cellulitis again. She is not a diabetic.  The history is provided by the patient.       Home Medications Prior to Admission medications   Medication Sig Start Date End Date Taking? Authorizing Provider  acetaminophen (TYLENOL) 500 MG tablet Take 500 mg by mouth every 6 (six) hours as needed for mild pain.    [provider]  Prenatal Vit-Fe Fumarate-FA (PRENATAL MULTIVITAMIN) TABS tablet Take 1 tablet by mouth daily at 12 noon.    [provider]  sulfamethoxazole-trimethoprim (BACTRIM DS) 800-160 MG tablet Take 1 tablet by mouth 2 (two) times daily. 05/17/21   Laurice Record, MD  valACYclovir (VALTREX) 1000 MG tablet Take 1,000 mg by mouth daily as needed (flare up). 05/05/21   [provider]      Allergies    Vicodin [hydrocodone-acetaminophen]    Review of Systems   Review of Systems  Constitutional:  Negative for activity change, appetite change and fever.  Eyes:  Positive for pain, discharge, redness and itching. Negative for visual disturbance.  Respiratory:  Negative for cough, chest tightness and shortness of breath.   Gastrointestinal:  Negative for abdominal pain, nausea and vomiting.  Genitourinary:  Negative for dysuria and menstrual problem.  Musculoskeletal:  Negative for arthralgias and  myalgias.  Neurological:  Negative for dizziness and light-headedness.   all other systems are negative except as noted in the HPI and PMH.    Physical Exam Updated Vital Signs BP 117/87 (BP Location: Right Arm)   Pulse 72   Temp 98.5 F (36.9 C) (Oral)   Resp 16   Ht 5' 9"  (1.753 m)   Wt 91.2 kg   SpO2 99%   BMI 29.69 kg/m  Physical Exam Vitals and nursing note reviewed.  Constitutional:      General: She is not in acute distress.    Appearance: She is well-developed.  HENT:     Head: Normocephalic and atraumatic.     Mouth/Throat:     Pharynx: No oropharyngeal exudate.  Eyes:     Conjunctiva/sclera: Conjunctivae normal.     Pupils: Pupils are equal, round, and reactive to light.     Comments: Painful red lump to right lower lid with mild surrounding erythema.  Extraocular movements are intact. No significant edema.  No pain with eye movements IOP 21  No areas of fluorescein uptake   Neck:     Comments: No meningismus. Cardiovascular:     Rate and Rhythm: Normal rate and regular rhythm.     Heart sounds: Normal heart sounds. No murmur heard. Pulmonary:     Effort: Pulmonary effort is normal. No respiratory distress.     Breath sounds: Normal breath sounds.  Abdominal:     Palpations:  Abdomen is soft.     Tenderness: There is no abdominal tenderness. There is no guarding or rebound.  Musculoskeletal:        General: No tenderness. Normal range of motion.     Cervical back: Normal range of motion and neck supple.  Skin:    General: Skin is warm.  Neurological:     Mental Status: She is alert and oriented to person, place, and time.     Cranial Nerves: No cranial nerve deficit.     Motor: No abnormal muscle tone.     Coordination: Coordination normal.     Comments:  5/5 strength throughout. CN 2-12 intact.Equal grip strength.   Psychiatric:        Behavior: Behavior normal.     ED Results / Procedures / Treatments   Labs (all labs ordered are listed, but  only abnormal results are displayed) Labs Reviewed - No data to display  EKG None  Radiology No results found.  Procedures Procedures    Medications Ordered in ED Medications  fluorescein ophthalmic strip 1 strip (has no administration in time range)  tetracaine (PONTOCAINE) 0.5 % ophthalmic solution 2 drop (has no administration in time range)    ED Course/ Medical Decision Making/ A&P                           Medical Decision Making Risk Prescription drug management.   Swelling to right eyelid for the past 1 week.  Second swelling developing over the past several days.  No vision changes.  Extraocular movements are intact.  No fever.   Visual Acuity Bilateral Distance: 20/50 without corrective lens R Distance: 20/70 without corrective lens L Distance: 20/50 without corrective lens   Exam consistent with stye.  Chalazion less likely.  Discussed warm compresses, topical antibiotics.  Patient concerned about early cellulitis so will initiate oral antibiotics as well.  No evidence of orbital cellulitis.  Possible early preseptal cellulitis.  Refer to ophthalmology.  Return to the ED sooner with vision changes, pain with eye movements, fever, any other concerns.        Final Clinical Impression(s) / ED Diagnoses Final diagnoses:  Hordeolum externum of right lower eyelid    Rx / DC Orders ED Discharge Orders     None         Tamber Burtch, Annie Main, MD 02/25/22 [phone]

## 2022-02-25 NOTE — Discharge Instructions (Addendum)
Use warm compresses and take the antibiotics as prescribed.  Follow-up with the eye doctor on Monday.  Return to the ED sooner with worsening pain, visual change, pain with eye movement, worsening redness or swelling or any other concerns.

## 2023-01-16 ENCOUNTER — Emergency Department (HOSPITAL_BASED_OUTPATIENT_CLINIC_OR_DEPARTMENT_OTHER)
Admission: EM | Admit: 2023-01-16 | Discharge: 2023-01-16 | Disposition: A | Discharge status: Home / Self Care | Payer: Medicaid Other | Attending: Emergency Medicine | Admitting: Emergency Medicine

## 2023-01-16 ENCOUNTER — Encounter (HOSPITAL_BASED_OUTPATIENT_CLINIC_OR_DEPARTMENT_OTHER): Payer: Self-pay | Admitting: Emergency Medicine

## 2023-01-16 ENCOUNTER — Emergency Department (HOSPITAL_BASED_OUTPATIENT_CLINIC_OR_DEPARTMENT_OTHER): Payer: Medicaid Other

## 2023-01-16 DIAGNOSIS — R1110 Vomiting, unspecified: Secondary | ICD-10-CM | POA: Diagnosis not present

## 2023-01-16 DIAGNOSIS — R1030 Lower abdominal pain, unspecified: Secondary | ICD-10-CM | POA: Diagnosis not present

## 2023-01-16 DIAGNOSIS — R109 Unspecified abdominal pain: Secondary | ICD-10-CM | POA: Diagnosis present

## 2023-01-16 LAB — COMPREHENSIVE METABOLIC PANEL
ALT: 14 U/L (ref 0–44)
AST: 12 U/L — ABNORMAL LOW (ref 15–41)
Albumin: 4.2 g/dL (ref 3.5–5.0)
Alkaline Phosphatase: 35 U/L — ABNORMAL LOW (ref 38–126)
Anion gap: 7 (ref 5–15)
BUN: 15 mg/dL (ref 6–20)
CO2: 27 mmol/L (ref 22–32)
Calcium: 9.3 mg/dL (ref 8.9–10.3)
Chloride: 102 mmol/L (ref 98–111)
Creatinine, Ser: 0.73 mg/dL (ref 0.44–1.00)
GFR, Estimated: >60 mL/min (ref >60)
Glucose, Bld: 96 mg/dL (ref 70–99)
Potassium: 3.8 mmol/L (ref 3.5–5.1)
Sodium: 136 mmol/L (ref 135–145)
Total Bilirubin: 0.3 mg/dL (ref 0.3–1.2)
Total Protein: 7.1 g/dL (ref 6.5–8.1)

## 2023-01-16 LAB — URINALYSIS, ROUTINE W REFLEX MICROSCOPIC
Bilirubin Urine: NEGATIVE
Glucose, UA: NEGATIVE mg/dL
Hgb urine dipstick: NEGATIVE
Ketones, ur: NEGATIVE mg/dL
Nitrite: NEGATIVE
Protein, ur: NEGATIVE mg/dL
Specific Gravity, Urine: 1.023 (ref 1.005–1.030)
Trans Epithel, UA: 2
pH: 6.5 (ref 5.0–8.0)

## 2023-01-16 LAB — CBC WITH DIFFERENTIAL/PLATELET
Abs Immature Granulocytes: 0.01 10*3/uL (ref 0.00–0.07)
Basophils Absolute: 0 10*3/uL (ref 0.0–0.1)
Basophils Relative: 0 %
Eosinophils Absolute: 0.1 10*3/uL (ref 0.0–0.5)
Eosinophils Relative: 1 %
HCT: 29 % — ABNORMAL LOW (ref 36.0–46.0)
Hemoglobin: 9.3 g/dL — ABNORMAL LOW (ref 12.0–15.0)
Immature Granulocytes: 0 %
Lymphocytes Relative: 40 %
Lymphs Abs: 2 10*3/uL (ref 0.7–4.0)
MCH: 26.1 pg (ref 26.0–34.0)
MCHC: 32.1 g/dL (ref 30.0–36.0)
MCV: 81.5 fL (ref 80.0–100.0)
Monocytes Absolute: 0.4 10*3/uL (ref 0.1–1.0)
Monocytes Relative: 7 %
Neutro Abs: 2.5 10*3/uL (ref 1.7–7.7)
Neutrophils Relative %: 52 %
Platelets: 266 10*3/uL (ref 150–400)
RBC: 3.56 MIL/uL — ABNORMAL LOW (ref 3.87–5.11)
RDW: 14.7 % (ref 11.5–15.5)
WBC: 4.9 10*3/uL (ref 4.0–10.5)
nRBC: 0 % (ref 0.0–0.2)

## 2023-01-16 LAB — LIPASE, BLOOD: Lipase: 74 U/L — ABNORMAL HIGH (ref 11–51)

## 2023-01-16 LAB — PREGNANCY, URINE: Preg Test, Ur: NEGATIVE

## 2023-01-16 MED ORDER — ONDANSETRON HCL 4 MG/2ML IJ SOLN
4.0000 mg | Freq: Once | INTRAMUSCULAR | Status: AC
  Administered 2023-01-16: 4 mg via INTRAVENOUS
  Filled 2023-01-16: qty 2

## 2023-01-16 MED ORDER — CEPHALEXIN 500 MG PO CAPS: 500.0000 mg | ORAL_CAPSULE | Freq: Four times a day (QID) | ORAL | 0 refills | Status: AC

## 2023-01-16 MED ORDER — KETOROLAC TROMETHAMINE 15 MG/ML IJ SOLN
15.0000 mg | Freq: Once | INTRAMUSCULAR | Status: AC
  Administered 2023-01-16: 15 mg via INTRAVENOUS
  Filled 2023-01-16: qty 1

## 2023-01-16 MED ORDER — IOHEXOL 300 MG/ML  SOLN
100.0000 mL | Freq: Once | INTRAMUSCULAR | Status: AC | PRN
  Administered 2023-01-16: 100 mL via INTRAVENOUS

## 2023-01-16 NOTE — ED Triage Notes (Signed)
Lower abdo pain, "felt like labor pains" Started around 7:30 1 episode of vomiting from pain

## 2023-01-16 NOTE — ED Provider Notes (Signed)
[facility] AT [facility] Provider Note   CSN: [id number] Arrival date & time: 01/16/23  2033     History  Chief Complaint  Patient presents with   Abdominal Pain    Carla Henderson is a 32 y.o. female.  Patient presents to the emergency department today for evaluation of acute onset of abdominal pain starting around 7:30 PM associated with 1 episode of vomiting.  Patient has a history of cesarean section and ovarian cyst removal surgery.  No other abdominal surgeries.  She was in her normal state of health.  She had a frozen chocolate covered strawberry earlier today but has not eaten much.  She developed acute onset of central lower abdominal pain that felt like "labor pains".  She could not find a comfortable position.  No associated dysuria, hematuria, increased frequency or urgency.  Patient did have a bowel movement without blood but it did not improve her pain.  No vaginal bleeding or discharge.  No other treatments prior to arrival.       Home Medications Prior to Admission medications   Medication Sig Start Date End Date Taking? Authorizing Provider  acetaminophen (TYLENOL) 500 MG tablet Take 500 mg by mouth every 6 (six) hours as needed for mild pain.    [provider]  amoxicillin-clavulanate (AUGMENTIN) 875-125 MG tablet Take 1 tablet by mouth every 12 (twelve) hours. 02/25/22   Rancour, Jeannett Senior, MD  erythromycin ophthalmic ointment Place a 1/2 inch ribbon of ointment into the lower eyelid 4 times daily x 7 days 02/25/22   Glynn Octave, MD  Prenatal Vit-Fe Fumarate-FA (PRENATAL MULTIVITAMIN) TABS tablet Take 1 tablet by mouth daily at 12 noon.    [provider]  sulfamethoxazole-trimethoprim (BACTRIM DS) 800-160 MG tablet Take 1 tablet by mouth 2 (two) times daily. 05/17/21   Danelle Earthly, MD  valACYclovir (VALTREX) 1000 MG tablet Take 1,000 mg by mouth daily as needed (flare up). 05/05/21   [provider]       Allergies    Vicodin [hydrocodone-acetaminophen]    Review of Systems   Review of Systems  Physical Exam Updated Vital Signs BP (!) 129/98   Pulse 70   Temp 98.6 F (37 C) (Oral)   Resp 18   LMP 12/26/2022 (Approximate)   SpO2 98%  Physical Exam Vitals and nursing note reviewed.  Constitutional:      General: She is not in acute distress.    Appearance: She is well-developed.  HENT:     Head: Normocephalic and atraumatic.     Right Ear: External ear normal.     Left Ear: External ear normal.     Nose: Nose normal.  Eyes:     Conjunctiva/sclera: Conjunctivae normal.  Cardiovascular:     Rate and Rhythm: Normal rate and regular rhythm.     Heart sounds: No murmur heard. Pulmonary:     Effort: No respiratory distress.     Breath sounds: No wheezing, rhonchi or rales.  Abdominal:     Palpations: Abdomen is soft.     Tenderness: There is abdominal tenderness (Mild) in the right lower quadrant, periumbilical area, suprapubic area and left lower quadrant. There is no guarding or rebound. Negative signs include Murphy's sign and McBurney's sign.  Musculoskeletal:     Cervical back: Normal range of motion and neck supple.     Right lower leg: No edema.     Left lower leg: No edema.  Skin:    General: Skin  is warm and dry.     Findings: No rash.  Neurological:     General: No focal deficit present.     Mental Status: She is alert. Mental status is at baseline.     Motor: No weakness.  Psychiatric:        Mood and Affect: Mood normal.     ED Results / Procedures / Treatments   Labs (all labs ordered are listed, but only abnormal results are displayed) Labs Reviewed  CBC WITH DIFFERENTIAL/PLATELET - Abnormal; Notable for the following components:      Result Value   RBC 3.56 (*)    Hemoglobin 9.3 (*)    HCT 29.0 (*)    All other components within normal limits  COMPREHENSIVE METABOLIC PANEL - Abnormal; Notable for the following components:   AST 12 (*)     Alkaline Phosphatase 35 (*)    All other components within normal limits  LIPASE, BLOOD - Abnormal; Notable for the following components:   Lipase 74 (*)    All other components within normal limits  URINALYSIS, ROUTINE W REFLEX MICROSCOPIC - Abnormal; Notable for the following components:   APPearance HAZY (*)    Leukocytes,Ua LARGE (*)    Bacteria, UA RARE (*)    All other components within normal limits  URINE CULTURE  PREGNANCY, URINE    EKG None  Radiology CT ABDOMEN PELVIS W CONTRAST  Result Date: 01/16/2023 CLINICAL DATA:  Abdominal pain, acute, nonlocalized generalized abd pain, worse midline lower, started tonight, vomiting Tender to touch. "Felt like labor pain." EXAM: CT ABDOMEN AND PELVIS WITH CONTRAST TECHNIQUE: Multidetector CT imaging of the abdomen and pelvis was performed using the standard protocol following bolus administration of intravenous contrast. RADIATION DOSE REDUCTION: This exam was performed according to the departmental dose-optimization program which includes automated exposure control, adjustment of the mA and/or kV according to patient size and/or use of iterative reconstruction technique. CONTRAST:  OMNIPAQUE IOHEXOL 300 MG/ML  SOLN COMPARISON:  None. FINDINGS: Lower chest: No acute abnormality. Hepatobiliary: No focal liver abnormality. Status post cholecystectomy. No biliary dilatation. Pancreas: No focal lesion. Normal pancreatic contour. No surrounding inflammatory changes. No main pancreatic ductal dilatation. Spleen: Normal in size without focal abnormality. Adrenals/Urinary Tract: No adrenal nodule bilaterally. Bilateral kidneys enhance symmetrically. No hydronephrosis. No hydroureter. The urinary bladder is unremarkable. Stomach/Bowel: Stomach is within normal limits. No evidence of bowel wall thickening or dilatation. Appendix appears normal. Vascular/Lymphatic: No abdominal aorta or iliac aneurysm. No abdominal, pelvic, or inguinal  lymphadenopathy. Reproductive: Uterus and bilateral adnexa are unremarkable. Other: No intraperitoneal free fluid. No intraperitoneal free gas. No organized fluid collection. Musculoskeletal: No abdominal wall hernia or abnormality. No suspicious lytic or blastic osseous lesions. No acute displaced fracture. IMPRESSION: 1. No acute intra-abdominal or intrapelvic abnormality. 2. Status post cholecystectomy. Electronically Signed   By: Tish Frederickson M.D.   On: 01/16/2023 22:56    Procedures Procedures    Medications Ordered in ED Medications  ketorolac (TORADOL) 15 MG/ML injection 15 mg (15 mg Intravenous Given 01/16/23 2101)  ondansetron (ZOFRAN) injection 4 mg (4 mg Intravenous Given 01/16/23 2101)  iohexol (OMNIPAQUE) 300 MG/ML solution 100 mL (100 mLs Intravenous Contrast Given 01/16/23 2228)    ED Course/ Medical Decision Making/ A&P    Patient seen and examined. History obtained directly from patient.   Labs/EKG: Ordered CBC, CMP, lipase, UA, pregnancy.  Imaging: None ordered  Medications/Fluids: Toradol and Zofran  Most recent vital signs reviewed and are as follows:  BP (!) 129/98   Pulse 70   Temp 98.6 F (37 C) (Oral)   Resp 18   LMP 12/26/2022 (Approximate)   SpO2 98%   Initial impression: Central lower abdominal pain, starting 90 minutes ago.  Patient appears comfortable at time of my exam.  10:06 PM Reassessment performed. Patient appears comfortable.  She had some soreness when she got up to the restroom, states that pain is better, but still 6 out of 10.  Labs personally reviewed and interpreted including: CBC with normal white blood cell count, anemia hemoglobin 9.3 normocytic otherwise unremarkable; CMP unremarkable; lipase slightly elevated but overall clinical exam was not consistent with pancreatitis.  UA pending.  Patient has just urinated and urine is in the lab.  Reviewed pertinent lab work and imaging with patient at bedside. Questions answered.   Most  current vital signs reviewed and are as follows: BP (!) 129/98   Pulse 70   Temp 98.6 F (37 C) (Oral)   Resp 18   LMP 12/26/2022 (Approximate)   SpO2 98%   Plan: Awaiting results of UA.  Discussed symptom control versus additional evaluation tonight with imaging.  If obvious UTI, patient will be treated for that, however if urine is clear she would feel most comfortable with proceeding with imaging to rule out other potential causes.  She states that she has small children at home and coming back to the emergency department may be difficult if she were to get worse.    11:10 PM Reassessment performed. Patient appears stable.  CT was performed as UA was not a clean-catch, does have some white cells so cannot rule out UTI but it is unclear.  Patient continues to feel better.  Imaging personally visualized and interpreted including: CT abdomen pelvis, agree negative.  Reviewed pertinent lab work and imaging with patient at bedside. Questions answered.   Most current vital signs reviewed and are as follows: BP (!) 129/98   Pulse 70   Temp 98.6 F (37 C) (Oral)   Resp 18   LMP 12/26/2022 (Approximate)   SpO2 98%   Plan: Discharge to home.   Prescriptions written for: Keflex  Other home care instructions discussed: OTC meds for pain  ED return instructions discussed: The patient was urged to return to the Emergency Department immediately with worsening of current symptoms, worsening abdominal pain, persistent vomiting, blood noted in stools, fever, or any other concerns. The patient verbalized understanding.   Follow-up instructions discussed: Patient encouraged to follow-up with their PCP in 3 days.                                  Medical Decision Making Amount and/or Complexity of Data Reviewed Labs: ordered.  Risk Prescription drug management.   For this patient's complaint of abdominal pain, the following conditions were considered on the differential diagnosis:  gastritis/PUD, enteritis/duodenitis, appendicitis, cholelithiasis/cholecystitis, cholangitis, pancreatitis, ruptured viscus, colitis, diverticulitis, small/large bowel obstruction, proctitis, cystitis, pyelonephritis, ureteral colic, aortic dissection, aortic aneurysm. In women, ectopic pregnancy, pelvic inflammatory disease, ovarian cysts, and tubo-ovarian abscess were also considered. Atypical chest etiologies were also considered including ACS, PE, and pneumonia.  Lab workup is overall reassuring.  Slightly elevated lipase of unclear etiology but symptoms are not consistent with pancreatitis and patient without signs of pancreatitis on CT.  Question of possible UTI.  No other irritative symptoms but is tender in the suprapubic area.  Does have white cells  on UA but this is not a clean-catch.  Given lack of other obvious etiologies, will give Keflex to treat for UTI while culture is pending.  The patient's vital signs, pertinent lab work and imaging were reviewed and interpreted as discussed in the ED course. Hospitalization was considered for further testing, treatments, or serial exams/observation. However as patient is well-appearing, has a stable exam, and reassuring studies today, I do not feel that they warrant admission at this time. This plan was discussed with the patient who verbalizes agreement and comfort with this plan and seems reliable and able to return to the Emergency Department with worsening or changing symptoms.           Final Clinical Impression(s) / ED Diagnoses Final diagnoses:  Lower abdominal pain    Rx / DC Orders ED Discharge Orders          Ordered    cephALEXin (KEFLEX) 500 MG capsule  4 times daily        01/16/23 2306              Renne Crigler, PA-C 01/16/23 2312    Charlynne Pander, MD 01/16/23 [phone]

## 2023-01-16 NOTE — ED Notes (Signed)
 RN reviewed discharge instructions with pt. Pt verbalized understanding and had no further questions. VSS upon discharge.  

## 2023-01-16 NOTE — Discharge Instructions (Signed)
Please read and follow all provided instructions.  Your diagnoses today include:  1. Lower abdominal pain     Tests performed today include: Blood cell counts and platelets: shows anemia Kidney and liver function tests Pancreas function test (called lipase): slightly high, unclear cause Urine test to look for infection: question of possible infection A blood or urine test for pregnancy (women only) CT abd/pelvis: Negative for any acute findings Vital signs. See below for your results today.   Medications prescribed:  Keflex (cephalexin) - antibiotic for possible UTI causing lower abdominal pain  You have been prescribed an antibiotic medicine: take the entire course of medicine even if you are feeling better. Stopping early can cause the antibiotic not to work.  Take any prescribed medications only as directed.  Home care instructions:  Follow any educational materials contained in this packet.  Follow-up instructions: Please follow-up with your primary care provider in the next 2 days for further evaluation of your symptoms.    Return instructions:  SEEK IMMEDIATE MEDICAL ATTENTION IF: The pain does not go away or becomes severe  A temperature above 101F develops  Repeated vomiting occurs (multiple episodes)  The pain becomes localized to portions of the abdomen. The right side could possibly be appendicitis. In an adult, the left lower portion of the abdomen could be colitis or diverticulitis.  Blood is being passed in stools or vomit (bright red or black tarry stools)  You develop chest pain, difficulty breathing, dizziness or fainting, or become confused, poorly responsive, or inconsolable (young children) If you have any other emergent concerns regarding your health  Additional Information: Abdominal (belly) pain can be caused by many things. Your caregiver performed an examination and possibly ordered blood/urine tests and imaging (CT scan, x-rays, ultrasound). Many cases  can be observed and treated at home after initial evaluation in the emergency department. Even though you are being discharged home, abdominal pain can be unpredictable. Therefore, you need a repeated exam if your pain does not resolve, returns, or worsens. Most patients with abdominal pain don't have to be admitted to the hospital or have surgery, but serious problems like appendicitis and gallbladder attacks can start out as nonspecific pain. Many abdominal conditions cannot be diagnosed in one visit, so follow-up evaluations are very important.  Your vital signs today were: BP (!) 129/98   Pulse 70   Temp 98.6 F (37 C) (Oral)   Resp 18   LMP 12/26/2022 (Approximate)   SpO2 98%  If your blood pressure (bp) was elevated above 135/85 this visit, please have this repeated by your doctor within one month. --------------

## 2023-01-16 NOTE — ED Notes (Signed)
Patient transported to CT 

## 2023-01-18 LAB — URINE CULTURE: Culture: 60000 — AB

## 2023-01-19 ENCOUNTER — Telehealth (HOSPITAL_BASED_OUTPATIENT_CLINIC_OR_DEPARTMENT_OTHER): Payer: Self-pay | Admitting: *Deleted

## 2023-01-19 NOTE — Telephone Encounter (Signed)
Post ED Visit - Positive Culture Follow-up  Culture report reviewed by antimicrobial stewardship pharmacist: [facility] []  Enzo Bi, Pharm.D. []  Celedonio Miyamoto, Pharm.D., BCPS AQ-ID []  Garvin Fila, Pharm.D., BCPS []  Georgina Pillion, Pharm.D., BCPS []  Santel, [place].D., BCPS, AAHIVP []  Estella Husk, Pharm.D., BCPS, AAHIVP [x]  Lysle Pearl, PharmD, BCPS []  Phillips Climes, PharmD, BCPS []  Agapito Games, PharmD, BCPS []  Verlan Friends, PharmD []  Mervyn Gay, PharmD, BCPS []  Vinnie Level, PharmD  [facility] []  Len Childs, PharmD []  Greer Pickerel, PharmD []  Adalberto Cole, PharmD []  Perlie Gold, Rph []  Lonell Face) Jean Rosenthal, PharmD []  Earl Many, PharmD []  Junita Push, PharmD []  Dorna Leitz, PharmD []  Terrilee Files, PharmD []  Lynann Beaver, PharmD []  Keturah Barre, PharmD []  Loralee Pacas, PharmD []  Bernadene Person, PharmD   Positive urine culture Like contaminant and no further patient follow-up is required at this time.  Virl Axe [facility] 01/19/2023, 7:53 AM

## 2024-01-14 ENCOUNTER — Other Ambulatory Visit: Payer: Self-pay

## 2024-01-14 ENCOUNTER — Encounter (HOSPITAL_BASED_OUTPATIENT_CLINIC_OR_DEPARTMENT_OTHER): Payer: Self-pay | Admitting: Emergency Medicine

## 2024-01-14 ENCOUNTER — Emergency Department (HOSPITAL_BASED_OUTPATIENT_CLINIC_OR_DEPARTMENT_OTHER)

## 2024-01-14 ENCOUNTER — Emergency Department (HOSPITAL_BASED_OUTPATIENT_CLINIC_OR_DEPARTMENT_OTHER)
Admission: EM | Admit: 2024-01-14 | Discharge: 2024-01-14 | Disposition: A | Discharge status: Home / Self Care | Attending: Emergency Medicine | Admitting: Emergency Medicine

## 2024-01-14 DIAGNOSIS — R059 Cough, unspecified: Secondary | ICD-10-CM | POA: Diagnosis not present

## 2024-01-14 DIAGNOSIS — M79602 Pain in left arm: Secondary | ICD-10-CM | POA: Diagnosis present

## 2024-01-14 DIAGNOSIS — R519 Headache, unspecified: Secondary | ICD-10-CM | POA: Diagnosis not present

## 2024-01-14 DIAGNOSIS — G5602 Carpal tunnel syndrome, left upper limb: Secondary | ICD-10-CM | POA: Diagnosis not present

## 2024-01-14 DIAGNOSIS — G44209 Tension-type headache, unspecified, not intractable: Secondary | ICD-10-CM

## 2024-01-14 DIAGNOSIS — M546 Pain in thoracic spine: Secondary | ICD-10-CM | POA: Insufficient documentation

## 2024-01-14 LAB — CBC WITH DIFFERENTIAL/PLATELET
Abs Immature Granulocytes: 0.01 K/uL (ref 0.00–0.07)
Basophils Absolute: 0 K/uL (ref 0.0–0.1)
Basophils Relative: 0 %
Eosinophils Absolute: 0.1 K/uL (ref 0.0–0.5)
Eosinophils Relative: 2 %
HCT: 35.4 % — ABNORMAL LOW (ref 36.0–46.0)
Hemoglobin: 11 g/dL — ABNORMAL LOW (ref 12.0–15.0)
Immature Granulocytes: 0 %
Lymphocytes Relative: 48 %
Lymphs Abs: 2.1 K/uL (ref 0.7–4.0)
MCH: 26.1 pg (ref 26.0–34.0)
MCHC: 31.1 g/dL (ref 30.0–36.0)
MCV: 83.9 fL (ref 80.0–100.0)
Monocytes Absolute: 0.3 K/uL (ref 0.1–1.0)
Monocytes Relative: 7 %
Neutro Abs: 2 K/uL (ref 1.7–7.7)
Neutrophils Relative %: 43 %
Platelets: 282 K/uL (ref 150–400)
RBC: 4.22 MIL/uL (ref 3.87–5.11)
RDW: 17.1 % — ABNORMAL HIGH (ref 11.5–15.5)
WBC: 4.6 K/uL (ref 4.0–10.5)
nRBC: 0 % (ref 0.0–0.2)

## 2024-01-14 LAB — COMPREHENSIVE METABOLIC PANEL WITH GFR
ALT: 16 U/L (ref 0–44)
AST: 14 U/L — ABNORMAL LOW (ref 15–41)
Albumin: 4.5 g/dL (ref 3.5–5.0)
Alkaline Phosphatase: 47 U/L (ref 38–126)
Anion gap: 11 (ref 5–15)
BUN: 9 mg/dL (ref 6–20)
CO2: 25 mmol/L (ref 22–32)
Calcium: 9.9 mg/dL (ref 8.9–10.3)
Chloride: 102 mmol/L (ref 98–111)
Creatinine, Ser: 0.81 mg/dL (ref 0.44–1.00)
GFR, Estimated: >60 mL/min (ref >60)
Glucose, Bld: 110 mg/dL — ABNORMAL HIGH (ref 70–99)
Potassium: 4.3 mmol/L (ref 3.5–5.1)
Sodium: 138 mmol/L (ref 135–145)
Total Bilirubin: 0.2 mg/dL (ref 0.0–1.2)
Total Protein: 7.5 g/dL (ref 6.5–8.1)

## 2024-01-14 LAB — PRO BRAIN NATRIURETIC PEPTIDE: Pro Brain Natriuretic Peptide: <50 pg/mL (ref <300.0)

## 2024-01-14 LAB — RESP PANEL BY RT-PCR (RSV, FLU A&B, COVID)  RVPGX2
Influenza A by PCR: NEGATIVE
Influenza B by PCR: NEGATIVE
Resp Syncytial Virus by PCR: NEGATIVE
SARS Coronavirus 2 by RT PCR: NEGATIVE

## 2024-01-14 LAB — HCG, SERUM, QUALITATIVE: Preg, Serum: NEGATIVE

## 2024-01-14 LAB — TROPONIN T, HIGH SENSITIVITY: Troponin T High Sensitivity: <15 ng/L (ref 0–19)

## 2024-01-14 MED ORDER — KETOROLAC TROMETHAMINE 15 MG/ML IJ SOLN
15.0000 mg | Freq: Once | INTRAMUSCULAR | Status: AC
  Administered 2024-01-14: 15 mg via INTRAVENOUS
  Filled 2024-01-14: qty 1

## 2024-01-14 MED ORDER — IOHEXOL 350 MG/ML SOLN
100.0000 mL | Freq: Once | INTRAVENOUS | Status: AC | PRN
  Administered 2024-01-14: 75 mL via INTRAVENOUS

## 2024-01-14 MED ORDER — LIDOCAINE 5 % EX PTCH: 1.0000 | MEDICATED_PATCH | CUTANEOUS | 0 refills | Status: AC

## 2024-01-14 MED ORDER — ACETAMINOPHEN 500 MG PO TABS
1000.0000 mg | ORAL_TABLET | Freq: Once | ORAL | Status: AC
  Administered 2024-01-14: 1000 mg via ORAL
  Filled 2024-01-14: qty 2

## 2024-01-14 MED ORDER — ACETAZOLAMIDE 250 MG PO TABS: 500.0000 mg | ORAL_TABLET | Freq: Two times a day (BID) | ORAL | 0 refills | Status: DC

## 2024-01-14 MED ORDER — DIPHENHYDRAMINE HCL 50 MG/ML IJ SOLN
12.5000 mg | Freq: Once | INTRAMUSCULAR | Status: AC
  Administered 2024-01-14: 12.5 mg via INTRAVENOUS
  Filled 2024-01-14: qty 1

## 2024-01-14 MED ORDER — METOCLOPRAMIDE HCL 5 MG/ML IJ SOLN
10.0000 mg | Freq: Once | INTRAMUSCULAR | Status: AC
  Administered 2024-01-14: 10 mg via INTRAVENOUS
  Filled 2024-01-14: qty 2

## 2024-01-14 NOTE — ED Triage Notes (Addendum)
 Patient reports left upper back pain for 1 week unrelieved by home remedies. On Friday she developed numbness in 1st two fingers on left hand. Last night she developed a headache at 6pm. She describes the headache with gradual onset and pain is intermittent. Denies recent injuries.

## 2024-01-14 NOTE — ED Notes (Signed)
 Patient transported to CT

## 2024-01-14 NOTE — Discharge Instructions (Addendum)
 Your workup was overall reassuring with reassuring CT imaging of the chest, reassuring laboratory evaluation.  Your CT head did reveal the following: IMPRESSION:  1. 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 This can be a  normal variant but also is associated with idiopathic intracranial  hypertension (pseudotumor cerebri).  2. Otherwise normal noncontrast CT appearance of the brain.    Your ocular ultrasound revealed elevated optic nerve sheath diameter, which could point to a condition called into a pathic intracranial hypertension which can be a cause of chronic headaches. You were offered a lumbar puncture for further diagnostic workup but declined. Due to this, we will start treatment with a medication called acetazolamide  and have you follow-up with neurology and ophthalmology. You back pain is likely musculoskeletal as your workup was reassuring.   LUE DVT US : IMPRESSION:  No evidence of DVT within the left upper extremity.

## 2024-01-14 NOTE — ED Provider Notes (Signed)
 [place] [facility] AT [facility] Provider Note   CSN: [id number] Arrival date & time: 01/14/24  9650     Patient presents with: Back Pain and Numbness   Carla Henderson is a 33 y.o. female.    Back Pain Associated symptoms: chest pain, headaches and numbness   Associated symptoms: no fever      33 year old female with medical history significant for PCOS, HSV presenting to the emergency department with multiple complaints.  The patient states that her symptoms started 1 week ago with left upper scapula pain.  Symptoms migrated slightly to her left chest wall and axillary region.  She since had cramping and pain in her left arm in the upper arm.  She has developed numbness in her left 2nd and 3rd digit over the past 2 to 3 days.  She denies any neck pain, denies any fevers or chills or difficulty with neck range of motion.  She endorses a headache located in a bandlike pattern across her forehead which has been present for the past day.  She states that her headache came on gradually throughout the day.  Pain has been intermittent.  No recent falls or trauma.  She still endorses sharp pain in her left upper back with some radiation to the left chest, worse when she coughs.  She denies shortness of breath.  She denies any history of DVT or PE.  She denies any lower extremity swelling.  She continues to endorse crampy discomfort in the left upper arm.  Prior to Admission medications   Medication Sig Start Date End Date Taking? Authorizing Provider  acetaZOLAMIDE  (DIAMOX ) 250 MG tablet Take 2 tablets (500 mg total) by mouth 2 (two) times daily for 28 days. 01/14/24 02/11/24 Yes Jerrol Agent, MD  lidocaine  (LIDODERM ) 5 % Place 1 patch onto the skin daily. Remove & Discard patch within 12 hours or as directed by MD 01/14/24  Yes Jerrol Agent, MD  acetaminophen  (TYLENOL ) 500 MG tablet Take 500 mg by mouth every 6 (six) hours as needed for mild pain.    [provider]  cephALEXin  (KEFLEX ) 500 MG capsule Take 1 capsule (500 mg total) by mouth 4 (four) times daily. 01/16/23   Desiderio Chew, PA-C  erythromycin  ophthalmic ointment Place a 1/2 inch ribbon of ointment into the lower eyelid 4 times daily x 7 days 02/25/22   Carita Senior, MD  Prenatal Vit-Fe Fumarate-FA (PRENATAL MULTIVITAMIN) TABS tablet Take 1 tablet by mouth daily at 12 noon.    [provider]  valACYclovir  (VALTREX ) 1000 MG tablet Take 1,000 mg by mouth daily as needed (flare up). 05/05/21   [provider]    Allergies: Vicodin [hydrocodone-acetaminophen ]    Review of Systems  Constitutional:  Negative for fever.  Respiratory:  Negative for cough and shortness of breath.   Cardiovascular:  Positive for chest pain.  Musculoskeletal:  Positive for back pain.  Neurological:  Positive for numbness and headaches.  All other systems reviewed and are negative.   Updated Vital Signs BP (!) 122/90   Pulse 61   Temp 97.6 F (36.4 C) (Oral)   Resp 16   Ht 5' 8 (1.727 m)   Wt 99.8 kg   LMP 12/26/2023 (Approximate)   SpO2 100%   BMI 33.45 kg/m   Physical Exam Vitals and nursing note reviewed.  Constitutional:      General: She is not in acute distress.    Appearance: She is well-developed.  HENT:  Head: Normocephalic and atraumatic.  Eyes:     Conjunctiva/sclera: Conjunctivae normal.  Neck:     Comments: Range of motion of neck intact passively and actively with no pain, no midline tenderness, negative Spurling sign Cardiovascular:     Rate and Rhythm: Normal rate and regular rhythm.     Heart sounds: No murmur heard. Pulmonary:     Effort: Pulmonary effort is normal. No respiratory distress.     Breath sounds: Normal breath sounds.  Abdominal:     Palpations: Abdomen is soft.     Tenderness: There is no abdominal tenderness.  Musculoskeletal:        General: No swelling.     Cervical back: Normal range of motion and neck supple. No  tenderness.     Comments: Mild tenderness about the left upper arm, intact radial pulses  Skin:    General: Skin is warm and dry.     Capillary Refill: Capillary refill takes less than 2 seconds.  Neurological:     Mental Status: She is alert.     Comments: Left arm: intact motor function along the median, ulnar, radial nerve distributions, decreased sensation to light touch of the 2nd and 3rd digits on the left hand.   Psychiatric:        Mood and Affect: Mood normal.     (all labs ordered are listed, but only abnormal results are displayed) Labs Reviewed  CBC WITH DIFFERENTIAL/PLATELET - Abnormal; Notable for the following components:      Result Value   Hemoglobin 11.0 (*)    HCT 35.4 (*)    RDW 17.1 (*)    All other components within normal limits  COMPREHENSIVE METABOLIC PANEL WITH GFR - Abnormal; Notable for the following components:   Glucose, Bld 110 (*)    AST 14 (*)    All other components within normal limits  RESP PANEL BY RT-PCR (RSV, FLU A&B, COVID)  RVPGX2  HCG, SERUM, QUALITATIVE  PRO BRAIN NATRIURETIC PEPTIDE  TROPONIN T, HIGH SENSITIVITY    EKG: EKG Interpretation Date/Time:  Monday January 14 2024 05:13:47 EDT Ventricular Rate:  72 PR Interval:  146 QRS Duration:  91 QT Interval:  368 QTC Calculation: 403 R Axis:   18  Text Interpretation: Sinus rhythm RSR' in V1 or V2, right VCD or RVH Confirmed by Jerrol Agent ([id number]) on 01/14/2024 5:16:33 AM  Radiology: CT Angio Chest PE W and/or Wo Contrast Result Date: 01/14/2024 CLINICAL DATA:  33 year old female with headache. Finger tip numbness. Left upper extremity pain and numbness for 1 week. Neurologic deficit. EXAM: CT ANGIOGRAPHY CHEST WITH CONTRAST TECHNIQUE: Multidetector CT imaging of the chest was performed using the standard protocol during bolus administration of intravenous contrast. Multiplanar CT image reconstructions and MIPs were obtained to evaluate the vascular anatomy. RADIATION DOSE  REDUCTION: This exam was performed according to the departmental dose-optimization program which includes automated exposure control, adjustment of the mA and/or kV according to patient size and/or use of iterative reconstruction technique. CONTRAST:  75mL OMNIPAQUE  IOHEXOL  350 MG/ML SOLN COMPARISON:  Chest CTA 01/13/2015. FINDINGS: Cardiovascular: Good contrast bolus timing in the pulmonary arterial tree. No pulmonary artery filling defect identified. Normal heart size. No pericardial effusion. Early contrast in the thoracic aorta, negative visible aorta. Mediastinum/Nodes: Negative. Lungs/Pleura: Normal lung volumes, major airways are patent. Both lungs are clear aside from occasional punctate calcified pulmonary granuloma (benign series 6, image 63 - no follow-up imaging recommended). No pleural effusion. Upper Abdomen: Negative visible mostly noncontrast  liver, spleen, pancreas, adrenal glands, kidneys, and bowel in the upper abdomen. Cholecystectomy clips. Musculoskeletal: Negative; minor lower thoracic endplate spurring. Review of the MIP images confirms the above findings. IMPRESSION: Negative CTA Chest. No evidence of pulmonary embolus. Electronically Signed   By: VEAR Hurst M.D.   On: 01/14/2024 06:42   CT Head Wo Contrast Result Date: 01/14/2024 CLINICAL DATA:  33 year old female with headache. Finger tip numbness. Left upper extremity pain and numbness for 1 week. Neurologic deficit. EXAM: CT HEAD WITHOUT CONTRAST TECHNIQUE: Contiguous axial images were obtained from the base of the skull through the vertex without intravenous contrast. RADIATION DOSE REDUCTION: This exam was performed according to the departmental dose-optimization program which includes automated exposure control, adjustment of the mA and/or kV according to patient size and/or use of iterative reconstruction technique. COMPARISON:  Head CT 11/11/2014. FINDINGS: Brain: Chronic partially empty sella, stable since 2016. Normal background  brain volume. No midline shift, ventriculomegaly, mass effect, evidence of mass lesion, intracranial hemorrhage or evidence of cortically based acute infarction. Gray-white matter differentiation is within normal limits throughout the brain. Vascular: No suspicious intracranial vascular hyperdensity. Skull: Intact, negative. Sinuses/Orbits: Visualized paranasal sinuses and mastoids are clear. Other: Disconjugate gaze but unchanged since 2016. Negative scalp soft tissues. IMPRESSION: 1. 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 This can be a normal variant but also is associated with idiopathic intracranial hypertension (pseudotumor cerebri). 2. Otherwise normal noncontrast CT appearance of the brain. Electronically Signed   By: VEAR Hurst M.D.   On: 01/14/2024 06:36   US  Venous Img Upper Uni Left Result Date: 01/14/2024 CLINICAL DATA:  33 year old female with left upper extremity pain and numbness for 1 week. EXAM: LEFT UPPER EXTREMITY VENOUS DOPPLER ULTRASOUND TECHNIQUE: Gray-scale sonography with graded compression, as well as color Doppler and duplex ultrasound were performed to evaluate the upper extremity deep venous system from the level of the subclavian vein and including the jugular, axillary, basilic, radial, ulnar and upper cephalic vein. Spectral Doppler was utilized to evaluate flow at rest and with distal augmentation maneuvers. COMPARISON:  None Available. FINDINGS: Contralateral Subclavian Vein: Respiratory phasicity is normal and symmetric with the symptomatic side. No evidence of thrombus. Normal compressibility. Internal Jugular Vein: No evidence of thrombus. Normal compressibility, respiratory phasicity and response to augmentation. Subclavian Vein: No evidence of thrombus. Normal compressibility, respiratory phasicity and response to augmentation. Axillary Vein: No evidence of thrombus. Normal compressibility, respiratory phasicity and response to augmentation. Cephalic Vein: No  evidence of thrombus. Normal compressibility, respiratory phasicity and response to augmentation. Basilic Vein: No evidence of thrombus. Normal compressibility, respiratory phasicity and response to augmentation. Brachial Veins: No evidence of thrombus. Normal compressibility, respiratory phasicity and response to augmentation. Radial Veins: No evidence of thrombus. Normal compressibility, respiratory phasicity and response to augmentation. Ulnar Veins: No evidence of thrombus. Normal compressibility, respiratory phasicity and response to augmentation. Venous Reflux:  None visualized. Other Findings:  None visualized. IMPRESSION: No evidence of DVT within the left upper extremity. Electronically Signed   By: VEAR Hurst M.D.   On: 01/14/2024 06:24     Ultrasound ED Ocular  Date/Time: 01/14/2024 5:53 PM  Performed by: Jerrol Agent, MD Authorized by: Jerrol Agent, MD   PROCEDURE DETAILS:    Indications: evaluation for increased intracranial pressure     Assessed:  Left eye and right eye   Left eye axial view: obtained     Right eye axial view: obtained     Images: archived  Limitations:  None RIGHT EYE FINDINGS:     right eye increased optic nerve sheath diameter LEFT EYE FINDINGS:     left eye increased optic nerve sheath diameter Comments:     Enlarged optic nerve sheath diameter, Right 8.0cm, left 7.8cm.     Medications Ordered in the ED  acetaminophen  (TYLENOL ) tablet 1,000 mg (1,000 mg Oral Given 01/14/24 0457)  iohexol  (OMNIPAQUE ) 350 MG/ML injection 100 mL (75 mLs Intravenous Contrast Given 01/14/24 0624)  metoCLOPramide  (REGLAN ) injection 10 mg (10 mg Intravenous Given 01/14/24 0750)  diphenhydrAMINE  (BENADRYL ) injection 12.5 mg (12.5 mg Intravenous Given 01/14/24 0750)  ketorolac  (TORADOL ) 15 MG/ML injection 15 mg (15 mg Intravenous Given 01/14/24 0750)                                    Medical Decision Making Amount and/or Complexity of Data Reviewed Labs:  ordered. Radiology: ordered.  Risk OTC drugs. Prescription drug management.    33 year old female with medical history significant for PCOS, HSV presenting to the emergency department with multiple complaints.  The patient states that her symptoms started 1 week ago with left upper scapula pain.  Symptoms migrated slightly to her left chest wall and axillary region.  She since had cramping and pain in her left arm in the upper arm.  She has developed numbness in her left 2nd and 3rd digit over the past 2 to 3 days.  She denies any neck pain, denies any fevers or chills or difficulty with neck range of motion.  She endorses a headache located in a bandlike pattern across her forehead which has been present for the past day.  She states that her headache came on gradually throughout the day.  Pain has been intermittent.  No recent falls or trauma.  She still endorses sharp pain in her left upper back with some radiation to the left chest, worse when she coughs.  She denies shortness of breath.  She denies any history of DVT or PE.  She denies any lower extremity swelling.  She continues to endorse crampy discomfort in the left upper arm.  On arrival, vitals were stable. Physical exam revealed no neck pain or pain with range of motion, no midline tenderness, negative Spurling sign, left upper extremity with some TTP of the upper arm, intact motor function along the median, ulnar, radial nerve distributions, decreased sensation to light touch of the 2nd and 3rd digits on the left hand.   Visual acuity: 20/20 bilat, no visual field deficit  Neuro exam: Intact, no focal deficit other than numbness along median nerve distribution  Ocular US : Enlarged optic nerve sheath diameter, Right 8.0cm, left 7.8cm.  Given patient headache, administered Tylenol , Reglan  and Benadryl  IV ordered, Toradol  ordered.  CT imaging was performed.  Patient complains of back pain, some discomfort when coughing.  Will obtain CTA  PE study to further evaluate for rib fracture, consider musculoskeletal strain, pain is not ripping or tearing, considered aortic dissection, feel less likely, symptoms have been ongoing for the past week.  DVT ultrasound also ordered to evaluate for upper extremity DVT.  CT head: IMPRESSION:  1. 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 This can be a  normal variant but also is associated with idiopathic intracranial  hypertension (pseudotumor cerebri).  2. Otherwise normal noncontrast CT appearance of the brain.   CTA PE: IMPRESSION:  Negative CTA Chest. No evidence of pulmonary  embolus.   Cardiovascular: Good contrast bolus timing in the pulmonary arterial  tree. No pulmonary artery filling defect identified.    Normal heart size. No pericardial effusion. Early contrast in the  thoracic aorta, negative visible aorta.   DVT US : IMPRESSION:  No evidence of DVT within the left upper extremity.   As the patient's numbness affects her median nerve of the left hand, consider peripheral neuropathy such as carpal tunnel syndrome.  The patient does work frequently on computers.  Patient was provided with a wrist brace for comfort.  Labs: hCG negative, CBC without a leukocytosis, mild anemia noted, cardiac troponin normal, BNP normal, COVID flu and RSV PCR testing negative, CMP unremarkable.  Patient feeling symptomatically improved after the above interventions. Explained based on CT and Ocular US  findings the potential for IIH. The patient has had chronic intermittent headaches for the past three years. No vision loss, no blurred vision. Offered her a lumbar puncture for further diagnostic workup and after consideration of risks and benefits, the patient declined LP today. Will provide referral to both ophthalmology and neurology for continued outpatient management and workup.  Patient overall feeling symptomatically improved, overall well-appearing, reassuring workup. Will discharge  on acetazolamide  given combination of CT findings, history of present illness and ocular ultrasound findings.  DC Instructions: Your ocular ultrasound revealed elevated optic nerve sheath diameter, which could point to a condition called into a pathic intracranial hypertension which can be a cause of chronic headaches. You were offered a lumbar puncture for further diagnostic workup but declined. Due to this, we will start treatment with a medication called acetazolamide  and have you follow-up with neurology and ophthalmology. You back pain is likely musculoskeletal as your workup was reassuring.      Final diagnoses:  Carpal tunnel syndrome of left wrist  Acute non intractable tension-type headache  Acute left-sided thoracic back pain    ED Discharge Orders          Ordered    lidocaine  (LIDODERM ) 5 %  Every 24 hours        01/14/24 0731    Ambulatory referral to Ophthalmology        01/14/24 0734    Ambulatory referral to Neurology       Comments: An appointment is requested in approximately: 4 weeks   01/14/24 0735    acetaZOLAMIDE  (DIAMOX ) 250 MG tablet  2 times daily        01/14/24 0736               Jerrol Agent, MD 01/14/24 1754

## 2024-02-06 ENCOUNTER — Ambulatory Visit: Admitting: Neurology

## 2024-04-21 ENCOUNTER — Encounter: Payer: Self-pay | Admitting: Neurology

## 2024-04-21 ENCOUNTER — Ambulatory Visit: Admitting: Neurology

## 2024-04-21 VITALS — BP 113/72 | HR 79 | Ht 69.0 in | Wt 202.0 lb

## 2024-04-21 DIAGNOSIS — G44209 Tension-type headache, unspecified, not intractable: Secondary | ICD-10-CM | POA: Diagnosis not present

## 2024-04-21 NOTE — Patient Instructions (Signed)
 Lumbar puncture to rule out IIH  I will contact you to go over the results.  If positive, will start Diamox  Continue your other medications Return in 6 months or sooner if worse

## 2024-04-21 NOTE — Progress Notes (Signed)
 "   [facility]  PATIENT: Carla Henderson DOB: Jan 02, 1991  REQUESTING CLINICIAN: Jerrol Agent, MD HISTORY FROM: Patient  REASON FOR VISIT: Headaches   HISTORICAL  CHIEF COMPLAINT:  Chief Complaint  Patient presents with   New Patient (Initial Visit)    Room 12 Alone Acute non intractable tension-type headache    HISTORY OF PRESENT ILLNESS:  Discussed the use of AI scribe software for clinical note transcription with the patient, who gave verbal consent to proceed.  Carla Henderson is a 33 year old female who presents with headaches. She was referred by an emergency room physician for further evaluation of headaches with head CT showing empty sella syndrome and an ocular US  showing increased bilateral optic nerve sheath diameter.  She has been experiencing headaches for several years, occurring approximately twice a week. These headaches are not constant and are relieved by taking Tylenol  or ibuprofen . They are described as not severe and sometimes occur in the morning. During a recent emergency room visit, she was told that imaging studies, including CT and ultrasound, showed an empty sella.  She experiences neck and back pain, which was initially the reason for her ER visit. The pain in her neck radiates to her back.  She reports seeing 'little floaters' or 'little bitty black circles' in her vision. An ophthalmologist provided her with glasses and noted pressure in her eyes, performing an ocular ultrasound.  She was prescribed Diamox  but has not started taking it.     OTHER MEDICAL CONDITIONS: Headaches    REVIEW OF SYSTEMS: Full 14 system review of systems performed and negative with exception of: As noted in the HPI   ALLERGIES: Allergies[1]  HOME MEDICATIONS: Outpatient Medications Prior to Visit  Medication Sig Dispense Refill   acetaminophen  (TYLENOL ) 500 MG tablet Take 500 mg by mouth every 6 (six) hours as needed for mild pain.      acetaZOLAMIDE  (DIAMOX ) 250 MG tablet Take 2 tablets (500 mg total) by mouth 2 (two) times daily for 28 days. 112 tablet 0   cephALEXin  (KEFLEX ) 500 MG capsule Take 1 capsule (500 mg total) by mouth 4 (four) times daily. 28 capsule 0   erythromycin  ophthalmic ointment Place a 1/2 inch ribbon of ointment into the lower eyelid 4 times daily x 7 days 3.5 g 0   lidocaine  (LIDODERM ) 5 % Place 1 patch onto the skin daily. Remove & Discard patch within 12 hours or as directed by MD 30 patch 0   Prenatal Vit-Fe Fumarate-FA (PRENATAL MULTIVITAMIN) TABS tablet Take 1 tablet by mouth daily at 12 noon.     valACYclovir  (VALTREX ) 1000 MG tablet Take 1,000 mg by mouth daily as needed (flare up).     No facility-administered medications prior to visit.    PAST MEDICAL HISTORY: Past Medical History:  Diagnosis Date   Abnormal Pap smear    Anemia    Bronchitis    x 1 time   Gestational diabetes    HSV (herpes simplex virus) anogenital infection    Infertility, female    Ovarian cyst    PCOS (polycystic ovarian syndrome)    Spinal headache    UTI (lower urinary tract infection)    Vaginal Pap smear, abnormal     PAST SURGICAL HISTORY: Past Surgical History:  Procedure Laterality Date   CERVICAL CERCLAGE     CERVICAL CERCLAGE N/A 09/13/2017   Procedure: CERCLAGE CERVICAL;  Surgeon: Armond Cape, MD;  Location: [facility];  Service: Gynecology;  Laterality:  N/A;   CERVICAL CERCLAGE N/A 09/08/2020   Procedure: CERCLAGE CERVICAL;  Surgeon: Armond Cape, MD;  Location: [facility];  Service: Gynecology;  Laterality: N/A;   CESAREAN SECTION N/A 02/22/2021   Procedure: CESAREAN SECTION;  Surgeon: Rosalva Sawyer, MD;  Location: [facility];  Service: Obstetrics;  Laterality: N/A;   CHOLECYSTECTOMY     LAPAROSCOPIC CHOLECYSTECTOMY SINGLE SITE WITH INTRAOPERATIVE CHOLANGIOGRAM N/A 03/01/2017   Procedure: LAPAROSCOPIC CHOLECYSTECTOMY SINGLE SITE WITH INTRAOPERATIVE CHOLANGIOGRAM;  Surgeon: Sheldon Standing, MD;  Location:  [facility];  Service: General;  Laterality: N/A;   LAPAROSCOPIC OVARIAN CYSTECTOMY Left 01/07/2015   Procedure: Attempted LAPAROSCOPIC OVARIAN CYSTECTOMY, Mini laparotomy with excision of left ovarian cyst;  Surgeon: Winton Felt, MD;  Location: [facility];  Service: Gynecology;  Laterality: Left;  Requested 01-26-15 @ 3:30p   WISDOM TOOTH EXTRACTION      FAMILY HISTORY: Family History  Problem Relation Age of Onset   Hypertension Mother    Healthy Father    Hypertension Paternal Grandmother    Diabetes Paternal Grandfather    Other Neg Hx     SOCIAL HISTORY: Social History   Socioeconomic History   Marital status: Single    Spouse name: Not on file   Number of children: Not on file   Years of education: Not on file   Highest education level: Not on file  Occupational History   Not on file  Tobacco Use   Smoking status: Never    Passive exposure: Never   Smokeless tobacco: Never  Vaping Use   Vaping status: Never Used  Substance and Sexual Activity   Alcohol use: Not Currently    Comment: not while rpeg   Drug use: No   Sexual activity: Not Currently    Birth control/protection: None  Other Topics Concern   Not on file  Social History Narrative   Not on file   Social Drivers of Health   Tobacco Use: Low Risk (04/21/2024)   Patient History    Smoking Tobacco Use: Never    Smokeless Tobacco Use: Never    Passive Exposure: Never  Financial Resource Strain: Not on file  Food Insecurity: Not on file  Transportation Needs: Not on file  Physical Activity: Not on file  Stress: Not on file  Social Connections: Not on file  Intimate Partner Violence: Not on file  Depression (PHQ2-9): Low Risk (05/17/2021)   Depression (PHQ2-9)    PHQ-2 Score: 0  Alcohol Screen: Not on file  Housing: Not on file  Utilities: Not on file  Health Literacy: Not on file    PHYSICAL EXAM  GENERAL EXAM/CONSTITUTIONAL: Vitals:  Vitals:   04/21/24 1516  BP: 113/72  Pulse: 79  SpO2: 96%   Weight: 202 lb (91.6 kg)  Height: 5' 9 (1.753 m)   Body mass index is 29.83 kg/m. Wt Readings from Last 3 Encounters:  04/21/24 202 lb (91.6 kg)  01/14/24 220 lb (99.8 kg)  02/25/22 201 lb 1 oz (91.2 kg)   Patient is in no distress; well developed, nourished and groomed; neck is supple  MUSCULOSKELETAL: Gait, strength, tone, movements noted in Neurologic exam below  NEUROLOGIC: MENTAL STATUS:      No data to display         awake, alert, oriented to person, place and time recent and remote memory intact normal attention and concentration language fluent, comprehension intact, naming intact fund of knowledge appropriate  CRANIAL NERVE:  2nd, 3rd, 4th, 6th - pupils equal and reactive  to light, visual fields full to confrontation, extraocular muscles intact, no nystagmus 5th - facial sensation symmetric 7th - facial strength symmetric 8th - hearing intact 9th - palate elevates symmetrically, uvula midline 11th - shoulder shrug symmetric 12th - tongue protrusion midline  MOTOR:  normal bulk and tone, full strength in the BUE, BLE  SENSORY:  normal and symmetric to light touch  COORDINATION:  finger-nose-finger, fine finger movements normal  GAIT/STATION:  normal   DIAGNOSTIC DATA (LABS, IMAGING, TESTING) - I reviewed patient records, labs, notes, testing and imaging myself where available.  Lab Results  Component Value Date   WBC 4.6 01/14/2024   HGB 11.0 (L) 01/14/2024   HCT 35.4 (L) 01/14/2024   MCV 83.9 01/14/2024   PLT 282 01/14/2024      Component Value Date/Time   NA 138 01/14/2024 0458   K 4.3 01/14/2024 0458   CL 102 01/14/2024 0458   CO2 25 01/14/2024 0458   GLUCOSE 110 (H) 01/14/2024 0458   GLUCOSE 95 08/26/2015 1134   BUN 9 01/14/2024 0458   CREATININE 0.81 01/14/2024 0458   CALCIUM 9.9 01/14/2024 0458   PROT 7.5 01/14/2024 0458   ALBUMIN 4.5 01/14/2024 0458   AST 14 (L) 01/14/2024 0458   ALT 16 01/14/2024 0458   ALKPHOS 47  01/14/2024 0458   BILITOT 0.2 01/14/2024 0458   GFRNONAA >60 01/14/2024 0458   GFRAA >60 05/20/2018 0021   No results found for: CHOL, HDL, LDLCALC, LDLDIRECT, TRIG, CHOLHDL Lab Results  Component Value Date   HGBA1C 5.8 (H) 11/01/2016   No results found for: [id number] Lab Results  Component Value Date   TSH 1.226 10/01/2015    Head CT 01/14/2024 1. 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 This can be a normal variant but also is associated with idiopathic intracranial hypertension (pseudotumor cerebri). 2. Otherwise normal noncontrast CT appearance of the brain.   ASSESSMENT AND PLAN  33 y.o. year old female with    Idiopathic intracranial hypertension (suspected) Suspected idiopathic intracranial hypertension due to empty sella and headaches. Symptoms include headaches twice a week, sometimes in the morning, and occasional neck and back pain. No significant vision changes, but floaters present. Ultrasound ocular noted pressure on the eye, possibly linked to intracranial pressure. Differential includes normal anatomical variant, but symptoms suggest increased intracranial pressure. Discussed potential for optic nerve damage if untreated, leading to permanent vision changes. Explained that Diamox  can reduce fluid production and decrease pressure, but may cause paresthesia. Emphasized the importance of confirming diagnosis with lumbar puncture before starting treatment. - Ordered lumbar puncture to measure opening pressure. - Referred to ophthalmologist for full dilated eye exam. - If lumbar puncture confirms elevated pressure, will initiate Diamox  therapy. - Discussed potential side effects of Diamox , including paresthesia. - Advised to have someone drive her home after the lumbar puncture.  Tension-type headache Headaches occur twice a week, relieved by Tylenol  or ibuprofen . No constant severe headaches, but occasional morning headaches. No significant  vision changes, but floaters present. Headaches may be related to suspected idiopathic intracranial hypertension. - Continue current management with Tylenol  or ibuprofen  as needed for headache relief.    1. Tension headache     Patient Instructions  Lumbar puncture to rule out IIH  I will contact you to go over the results.  If positive, will start Diamox  Continue your other medications Return in 6 months or sooner if worse   Orders Placed This Encounter  Procedures   DG FL GUIDED  LUMBAR PUNCTURE    No orders of the defined types were placed in this encounter.   Return in about 6 months (around 10/20/2024).    Pastor Falling, MD 04/21/2024, 4:40 PM  [facility] [address], [address] [place], [place] [address] [phone]          [1]  Allergies Allergen Reactions   Vicodin [Hydrocodone-Acetaminophen ] Nausea And Vomiting and Other (See Comments)    Pt states does not cause swelling   "

## 2024-04-22 ENCOUNTER — Telehealth: Payer: Self-pay | Admitting: Neurology

## 2024-04-22 NOTE — Telephone Encounter (Signed)
 Pt states a referral is needed for [facility](ph#[phone]) and if they cannot take care of her they will then refer out.

## 2024-04-28 NOTE — Telephone Encounter (Signed)
 Will need to obtain the Lumbar puncture first, confirm the diagnosis then refer her to Ophthalmology.

## 2024-05-08 NOTE — Discharge Instructions (Signed)

## 2024-05-09 ENCOUNTER — Ambulatory Visit
Admission: RE | Admit: 2024-05-09 | Discharge: 2024-05-09 | Disposition: A | Discharge status: Home / Self Care | Source: Ambulatory Visit | Attending: Neurology | Admitting: Neurology

## 2024-05-09 VITALS — BP 109/65 | HR 63

## 2024-05-09 DIAGNOSIS — G44209 Tension-type headache, unspecified, not intractable: Secondary | ICD-10-CM

## 2024-05-09 LAB — UNLABELED: Test Ordered On Req: 468

## 2024-05-10 LAB — CSF CELL COUNT WITH DIFFERENTIAL
RBC Count, CSF: 0 {cells}/uL
TOTAL NUCLEATED CELL: 2 {cells}/uL (ref 0–5)

## 2024-05-10 LAB — PAT ID TIQ DOC

## 2024-05-10 LAB — GLUCOSE, CSF: Glucose, CSF: 63 mg/dL (ref 40–80)

## 2024-05-10 LAB — PROTEIN, CSF: Total Protein, CSF: 23 mg/dL (ref 15–45)

## 2024-05-17 ENCOUNTER — Ambulatory Visit: Payer: Self-pay | Admitting: Neurology

## 2024-05-17 DIAGNOSIS — G932 Benign intracranial hypertension: Secondary | ICD-10-CM

## 2024-05-17 MED ORDER — ACETAZOLAMIDE 250 MG PO TABS: 250.0000 mg | ORAL_TABLET | Freq: Two times a day (BID) | ORAL | 2 refills | Status: AC

## 2024-05-21 ENCOUNTER — Telehealth: Payer: Self-pay | Admitting: Neurology

## 2024-05-21 NOTE — Telephone Encounter (Signed)
 Referral for ophthalmology fax to [facility]. Phone: [phone], Fax: [phone]

## 2024-11-06 ENCOUNTER — Ambulatory Visit: Admitting: Neurology
